# Patient Record
Sex: Female | Born: 1985
Health system: Southern US, Community
[De-identification: ages and names within clinical notes are randomized; demographics above are authoritative.]

## PROBLEM LIST (undated history)

## (undated) ENCOUNTER — Inpatient Hospital Stay (HOSPITAL_COMMUNITY): Payer: Self-pay

## (undated) DIAGNOSIS — R3915 Urgency of urination: Secondary | ICD-10-CM

## (undated) DIAGNOSIS — N200 Calculus of kidney: Secondary | ICD-10-CM

## (undated) DIAGNOSIS — R87629 Unspecified abnormal cytological findings in specimens from vagina: Secondary | ICD-10-CM

## (undated) DIAGNOSIS — R300 Dysuria: Secondary | ICD-10-CM

## (undated) DIAGNOSIS — K219 Gastro-esophageal reflux disease without esophagitis: Secondary | ICD-10-CM

## (undated) DIAGNOSIS — O1490 Unspecified pre-eclampsia, unspecified trimester: Secondary | ICD-10-CM

## (undated) DIAGNOSIS — F329 Major depressive disorder, single episode, unspecified: Secondary | ICD-10-CM

## (undated) DIAGNOSIS — N1330 Unspecified hydronephrosis: Secondary | ICD-10-CM

## (undated) DIAGNOSIS — Z87442 Personal history of urinary calculi: Secondary | ICD-10-CM

## (undated) DIAGNOSIS — D649 Anemia, unspecified: Secondary | ICD-10-CM

## (undated) DIAGNOSIS — Z8489 Family history of other specified conditions: Secondary | ICD-10-CM

## (undated) DIAGNOSIS — F419 Anxiety disorder, unspecified: Secondary | ICD-10-CM

## (undated) DIAGNOSIS — J45909 Unspecified asthma, uncomplicated: Secondary | ICD-10-CM

## (undated) DIAGNOSIS — D241 Benign neoplasm of right breast: Secondary | ICD-10-CM

## (undated) DIAGNOSIS — K589 Irritable bowel syndrome without diarrhea: Secondary | ICD-10-CM

## (undated) DIAGNOSIS — R350 Frequency of micturition: Secondary | ICD-10-CM

## (undated) DIAGNOSIS — N390 Urinary tract infection, site not specified: Secondary | ICD-10-CM

## (undated) DIAGNOSIS — R52 Pain, unspecified: Secondary | ICD-10-CM

## (undated) HISTORY — DX: Major depressive disorder, single episode, unspecified: F32.9

## (undated) HISTORY — DX: Irritable bowel syndrome, unspecified: K58.9

## (undated) HISTORY — PX: WISDOM TOOTH EXTRACTION: SHX21

## (undated) HISTORY — DX: Unspecified abnormal cytological findings in specimens from vagina: R87.629

## (undated) HISTORY — PX: TONSILLECTOMY: SUR1361

## (undated) HISTORY — PX: KNEE SURGERY: SHX244

## (undated) HISTORY — DX: Benign neoplasm of right breast: D24.1

## (undated) HISTORY — DX: Anxiety disorder, unspecified: F41.9

## (undated) HISTORY — DX: Gastro-esophageal reflux disease without esophagitis: K21.9

---
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
## 2006-09-03 ENCOUNTER — Ambulatory Visit: Payer: Self-pay | Admitting: Gastroenterology

## 2007-03-27 ENCOUNTER — Other Ambulatory Visit: Admission: RE | Admit: 2007-03-27 | Discharge: 2007-03-27 | Payer: Self-pay | Admitting: Obstetrics and Gynecology

## 2008-04-12 ENCOUNTER — Other Ambulatory Visit: Admission: RE | Admit: 2008-04-12 | Discharge: 2008-04-12 | Payer: Self-pay | Admitting: Obstetrics and Gynecology

## 2008-04-19 ENCOUNTER — Ambulatory Visit (HOSPITAL_COMMUNITY): Admission: RE | Admit: 2008-04-19 | Discharge: 2008-04-19 | Payer: Self-pay | Admitting: Obstetrics and Gynecology

## 2008-10-21 ENCOUNTER — Ambulatory Visit (HOSPITAL_COMMUNITY): Admission: RE | Admit: 2008-10-21 | Discharge: 2008-10-21 | Payer: Self-pay | Admitting: Obstetrics & Gynecology

## 2009-04-20 ENCOUNTER — Other Ambulatory Visit: Admission: RE | Admit: 2009-04-20 | Discharge: 2009-04-20 | Payer: Self-pay | Admitting: Obstetrics and Gynecology

## 2009-04-28 ENCOUNTER — Ambulatory Visit (HOSPITAL_COMMUNITY): Admission: RE | Admit: 2009-04-28 | Discharge: 2009-04-28 | Payer: Self-pay | Admitting: Obstetrics and Gynecology

## 2009-11-02 ENCOUNTER — Ambulatory Visit (HOSPITAL_COMMUNITY): Admission: RE | Admit: 2009-11-02 | Discharge: 2009-11-02 | Payer: Self-pay | Admitting: Internal Medicine

## 2010-04-20 ENCOUNTER — Other Ambulatory Visit: Admission: RE | Admit: 2010-04-20 | Discharge: 2010-04-20 | Payer: Self-pay | Admitting: Obstetrics & Gynecology

## 2010-05-10 ENCOUNTER — Ambulatory Visit (HOSPITAL_COMMUNITY): Admission: RE | Admit: 2010-05-10 | Discharge: 2010-05-10 | Payer: Self-pay | Admitting: Obstetrics and Gynecology

## 2010-11-17 IMAGING — US US BREAST*R*
1 series · 6 of 6 positions shown · non-contrast
Comparison: 04/19/2008.

CLINICAL DATA: 6-month reevaluation of probably benign right
breast nodule.

RIGHT BREAST ULTRASOUND

[Series 1: us breast right · 0.07mm/px · 6 of 6 slices shown]
[im 1/6]
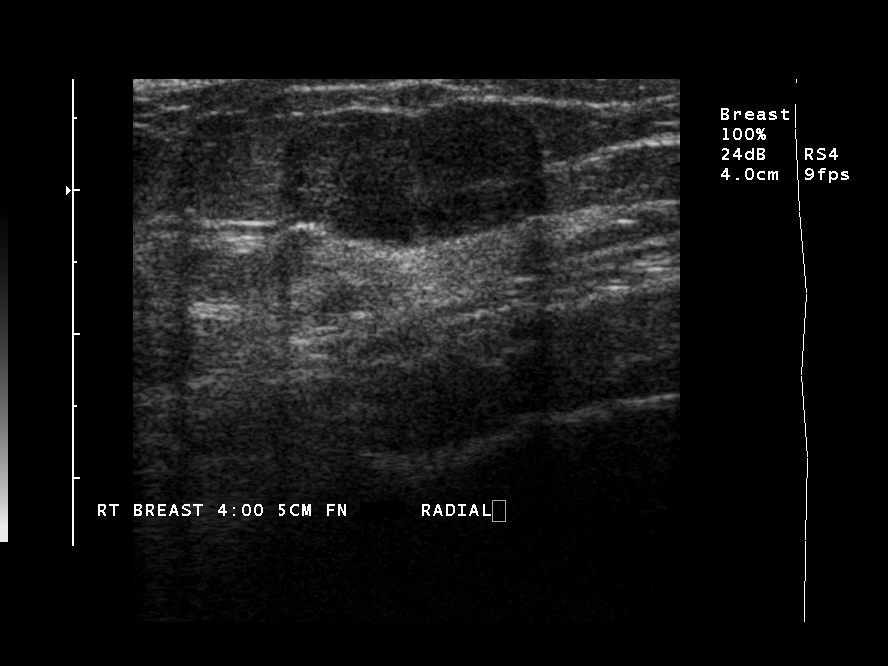
[im 2/6]
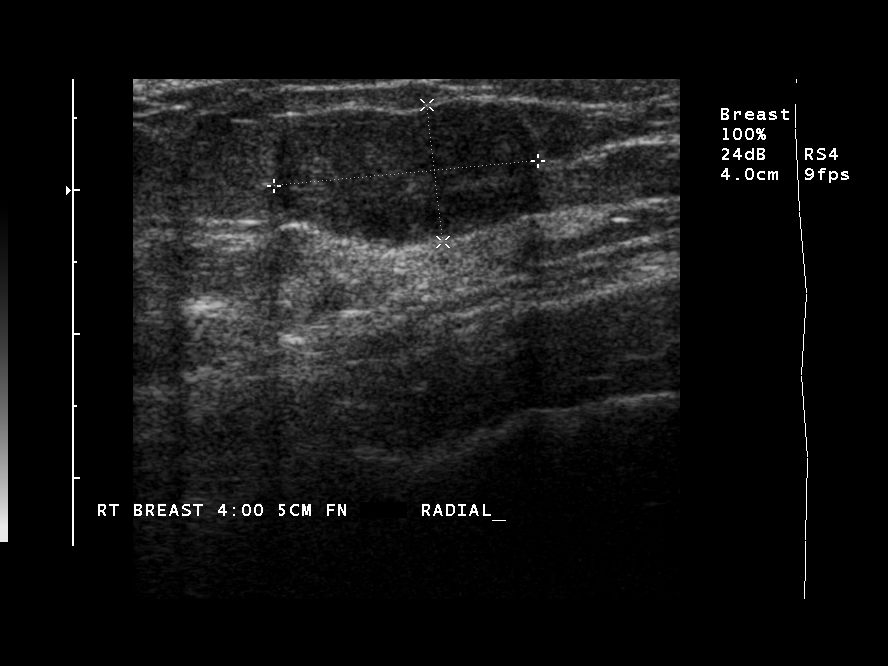
[im 3/6]
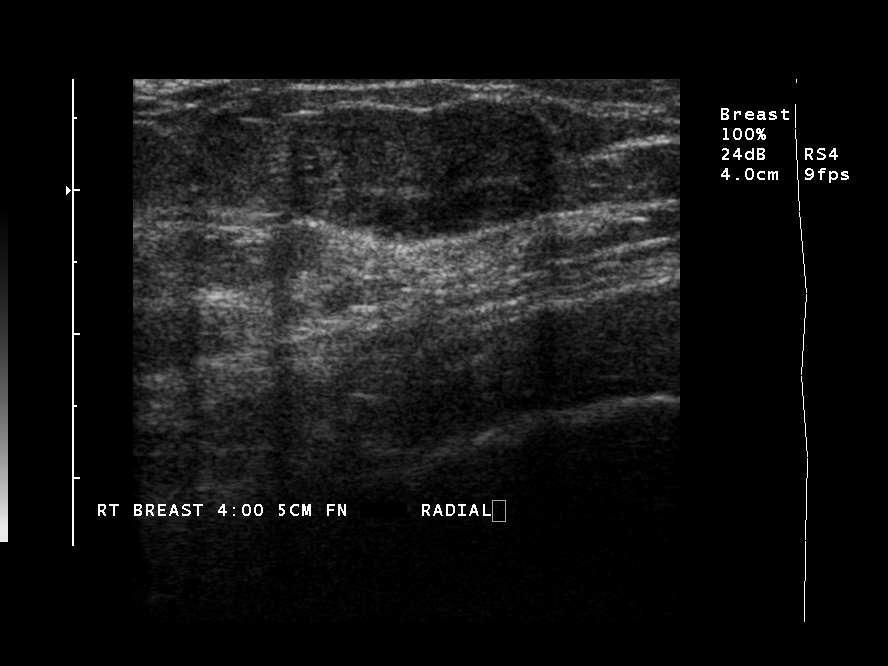
[im 4/6]
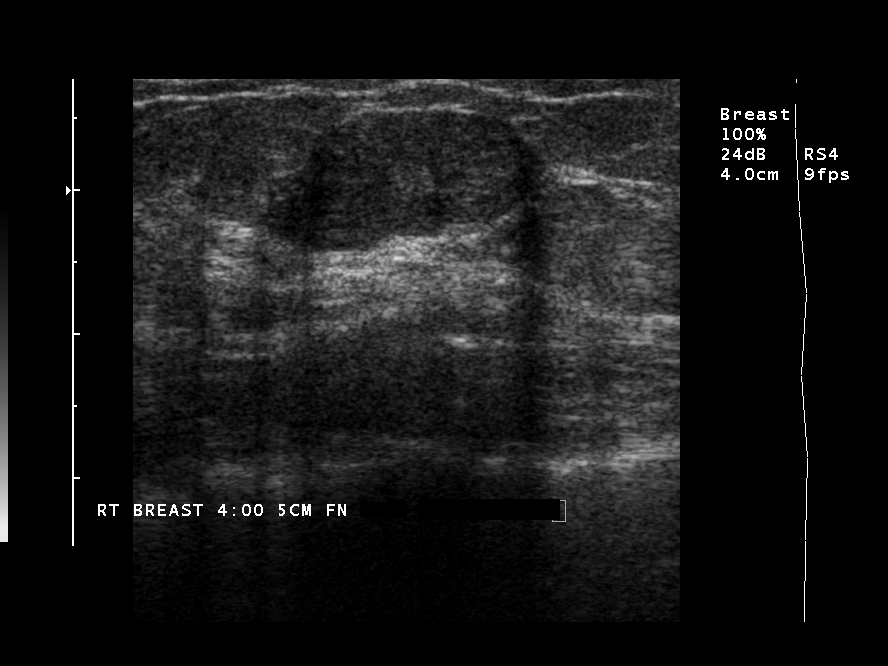
[im 5/6]
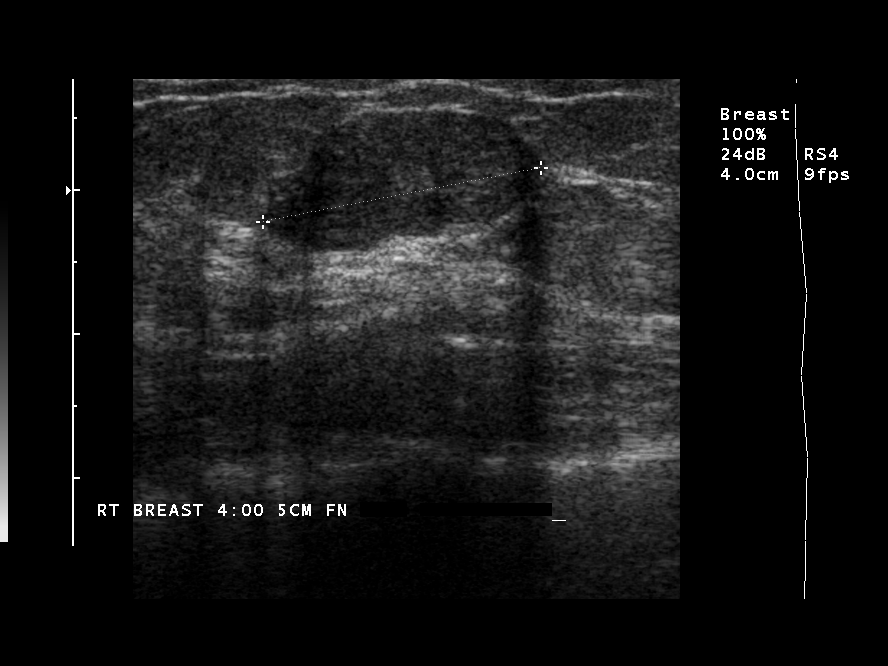
[im 6/6]
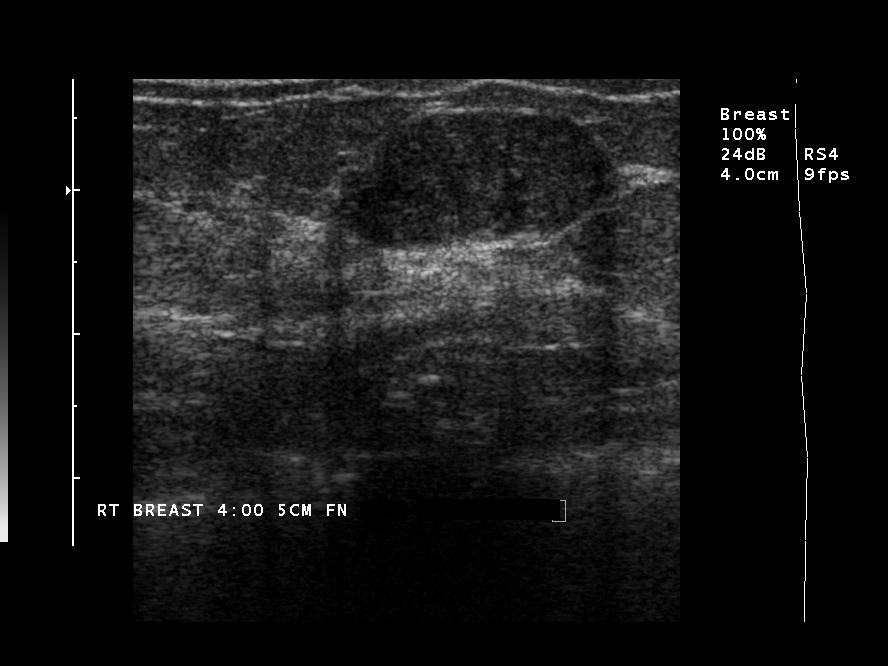

[6 of 6 positions shown; findings below may reference images not displayed]

On physical exam, there is a discrete mobile palpable mass located
within the right breast at the three to four o'clock position 5 cm
from the nipple.
FINDINGS: Ultrasound is performed, showing a circumscribed gently
lobulated solid mass within the right breast at the 3 to 4 o'clock
position 5 cm from the nipple.  This measures 2.0 x 1.8 x 1.0 cm in
size and has not significantly changed when compared with the prior
study (previously 1.9 x 0.9 x 1.7 cm in size).  There are several
thin septations within this mass.  There is no distortion or
worrisome shadowing.
IMPRESSION: 1. Circumscribed oval solid nodule located within the right breast
at the three to four o'clock position 5 cm from the nipple which is
stable and most likely represents a probably benign fibroadenoma.
Recommend follow-up right breast ultrasound in 6 months.

BI-RADS CATEGORY 3:  Probably benign finding(s) - short interval
follow-up suggested.
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
## 2011-04-24 ENCOUNTER — Other Ambulatory Visit: Payer: Self-pay | Admitting: Adult Health

## 2011-04-24 ENCOUNTER — Other Ambulatory Visit (HOSPITAL_COMMUNITY)
Admission: RE | Admit: 2011-04-24 | Discharge: 2011-04-24 | Disposition: A | Discharge status: Home / Self Care | Payer: 59 | Source: Ambulatory Visit | Attending: Obstetrics and Gynecology | Admitting: Obstetrics and Gynecology

## 2011-04-24 DIAGNOSIS — Z01419 Encounter for gynecological examination (general) (routine) without abnormal findings: Secondary | ICD-10-CM | POA: Insufficient documentation

## 2011-05-25 IMAGING — US US BREAST*R*
1 series · 4 of 4 positions shown · non-contrast
Comparison: Previous examinations dated 10/21/2008 and 04/19/2008.

CLINICAL DATA: Follow-up right breast probable fibroadenoma.

RIGHT BREAST ULTRASOUND

[Series 1: us breast*right* · 0.07mm/px · 4 of 4 slices shown]
[im 1/4]
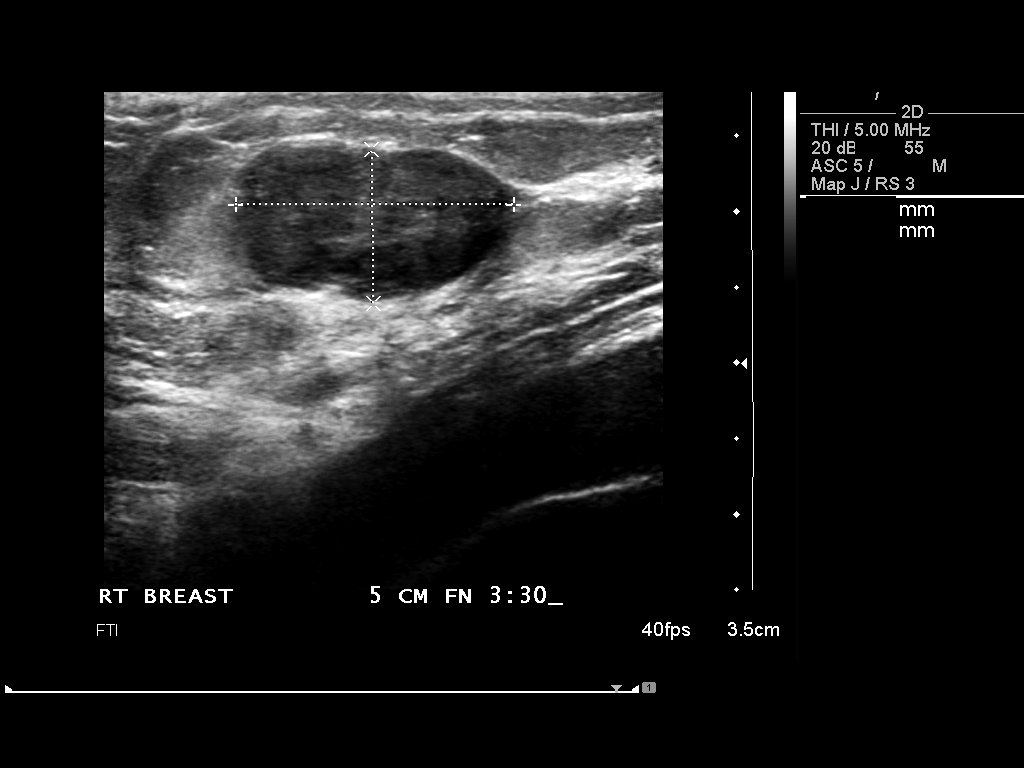
[im 2/4]
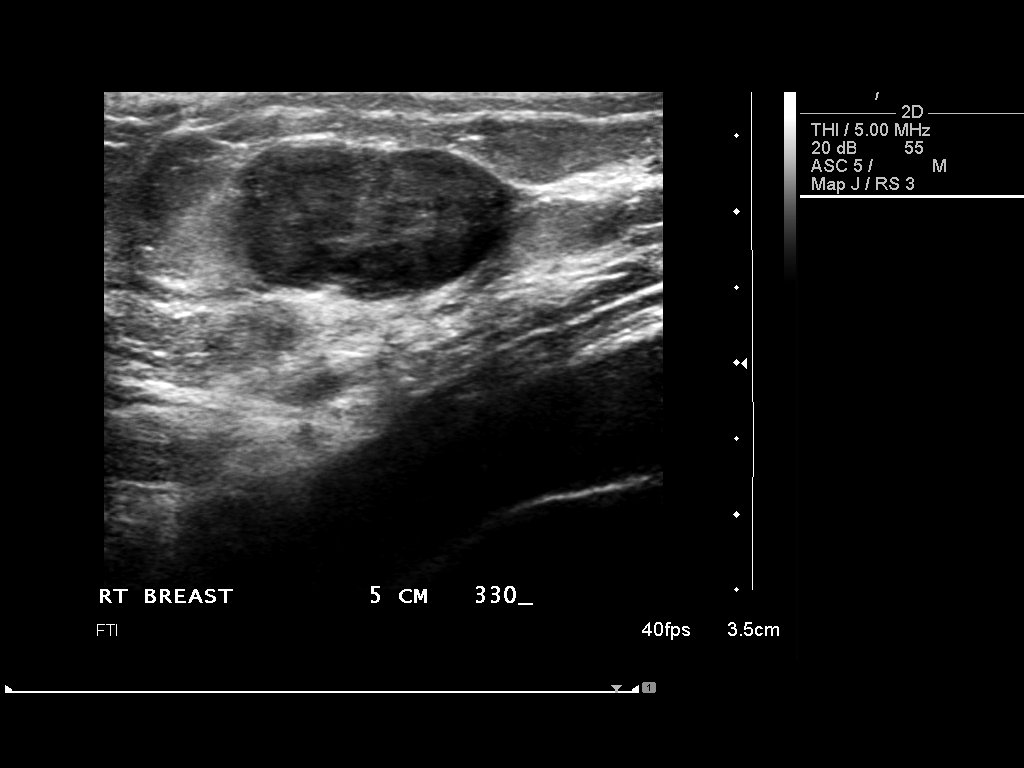
[im 3/4]
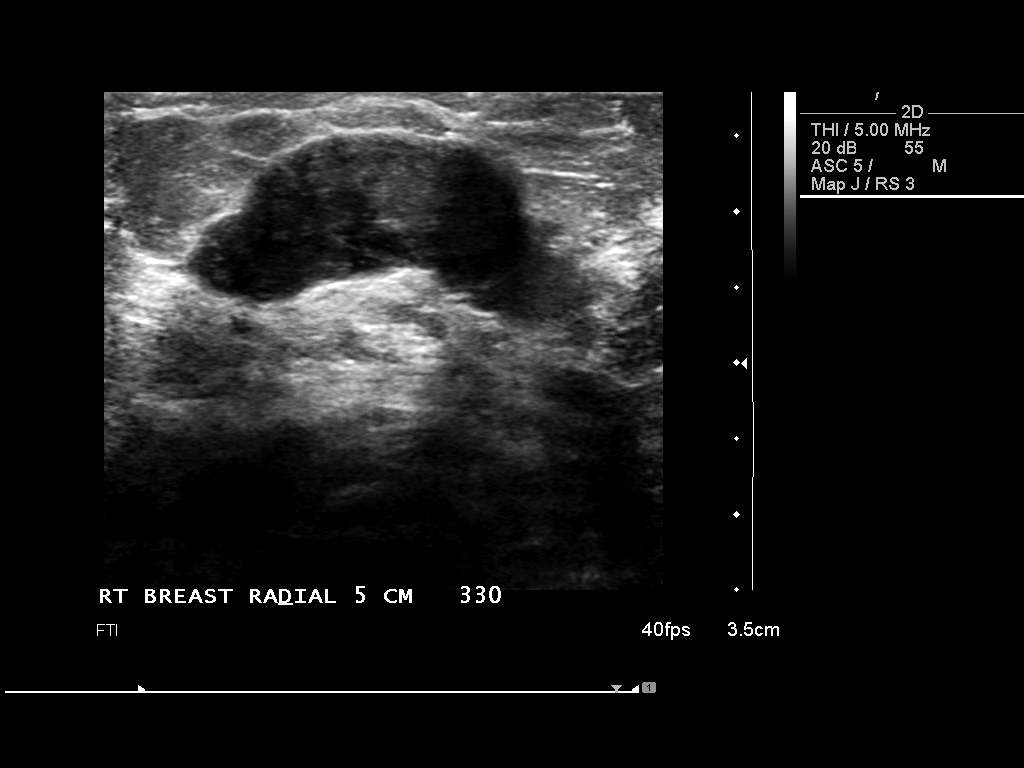
[im 4/4]
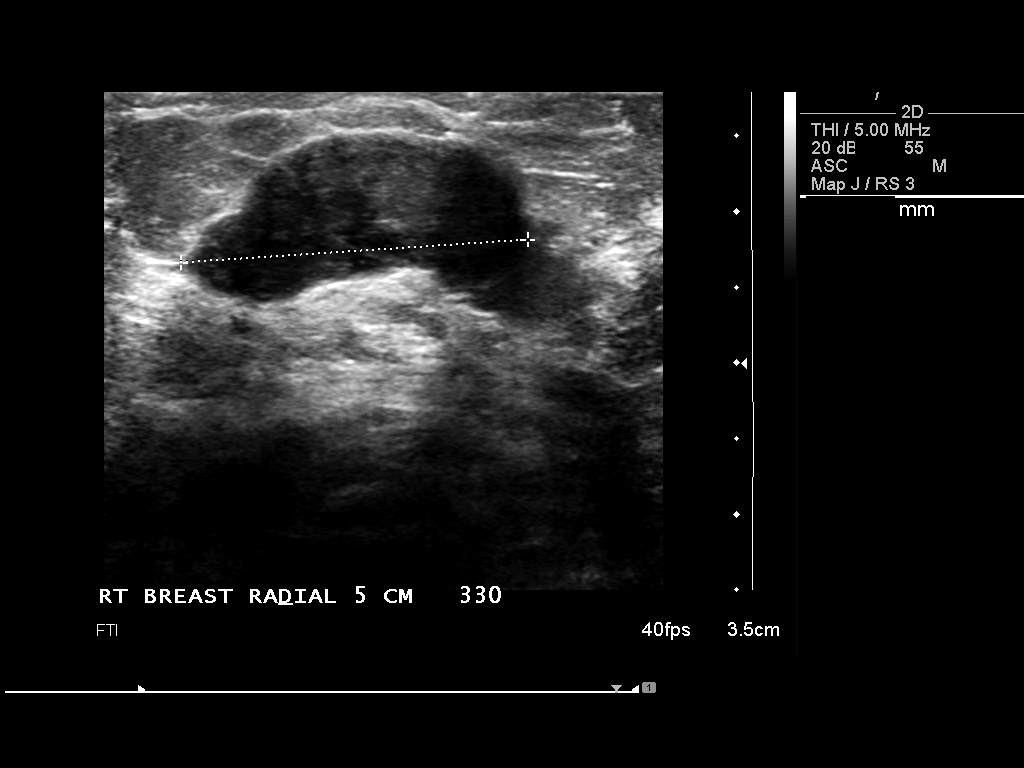

[4 of 4 positions shown; findings below may reference images not displayed]

On physical exam, a mobile palpable mass is confirmed in the 3:30
o'clock position of the right breast, 3 cm from the nipple.
FINDINGS: Ultrasound is performed, showing a 2.3 x 1.8 x 1.0 cm
oval, macrolobulated, horizontally oriented hypoechoic echoic mass
in the 3:30 o'clock position of the right breast, 5 cm from the
nipple.  This is slightly more lobulated than on the previous
examinations.  This measured 2.0 x 1.8 x 1.0 cm in maximum
dimensions on 10/21/2008 and 1.9 x 1.7 x 0.9 cm in maximum
dimensions on 04/19/2008.
IMPRESSION: Minimal increase in size of the previously demonstrated right
breast probable fibroadenoma.  A follow-up right breast ultrasound
is recommended in 6 months.  This has been discussed with the
patient.

BI-RADS CATEGORY 3:  Probably benign finding(s) - short interval
follow-up suggested.

## 2013-04-30 ENCOUNTER — Other Ambulatory Visit: Payer: Self-pay | Admitting: Adult Health

## 2013-05-04 ENCOUNTER — Other Ambulatory Visit: Payer: Self-pay | Admitting: Obstetrics & Gynecology

## 2013-05-11 ENCOUNTER — Other Ambulatory Visit (HOSPITAL_COMMUNITY)
Admission: RE | Admit: 2013-05-11 | Discharge: 2013-05-11 | Disposition: A | Discharge status: Home / Self Care | Payer: 59 | Source: Ambulatory Visit | Attending: Obstetrics & Gynecology | Admitting: Obstetrics & Gynecology

## 2013-05-11 ENCOUNTER — Encounter: Payer: Self-pay | Admitting: Obstetrics & Gynecology

## 2013-05-11 ENCOUNTER — Ambulatory Visit (INDEPENDENT_AMBULATORY_CARE_PROVIDER_SITE_OTHER): Payer: 59 | Admitting: Obstetrics & Gynecology

## 2013-05-11 VITALS — BP 120/78 | Ht 66.0 in | Wt 187.0 lb

## 2013-05-11 DIAGNOSIS — Z01419 Encounter for gynecological examination (general) (routine) without abnormal findings: Secondary | ICD-10-CM

## 2013-05-11 MED ORDER — ETONOGESTREL-ETHINYL ESTRADIOL 0.12-0.015 MG/24HR VA RING: VAGINAL_RING | VAGINAL | Status: DC

## 2013-05-11 MED ORDER — TERCONAZOLE 0.4 % VA CREA: 1.0000 | TOPICAL_CREAM | Freq: Every day | VAGINAL | Status: DC

## 2013-05-11 NOTE — Addendum Note (Signed)
Addended by: Colen Darling on: 05/11/2013 03:51 PM   Modules accepted: Orders

## 2013-05-11 NOTE — Progress Notes (Signed)
Patient ID: Melody Stewart, female   DOB: [birth year], 27 y.o.   MRN: [id number] Subjective:     Melody Stewart is a 27 y.o. female here for a routine exam.  Patient's last menstrual period was 05/03/2013. No obstetric history on file. Current complaints: none.  Gynecologic History Patient's last menstrual period was 05/03/2013. Contraception: NuvaRing vaginal inserts Last Pap: 2013. Results were: normal Last mammogram: na. Results were: na  Past Medical History  Diagnosis Date  . Asthma   . Colitis     Past Surgical History  Procedure Laterality Date  . Knee surgery      OB History   Grav Para Term Preterm Abortions TAB SAB Ect Mult Living                  History   Social History  . Marital Status: Single    Spouse Name: N/A    Number of Children: N/A  . Years of Education: N/A   Social History Main Topics  . Smoking status: Never Smoker   . Smokeless tobacco: None  . Alcohol Use: 0.5 oz/week    1 drink(s) per week  . Drug Use: None  . Sexual Activity: Yes   Other Topics Concern  . None   Social History Narrative  . None    Family History  Problem Relation Age of Onset  . Hypertension Mother   . Hypertension Father   . Heart disease Paternal Grandfather   . Stroke Other      Review of Systems  Review of Systems  Constitutional: Negative for fever, chills, weight loss, malaise/fatigue and diaphoresis.  HENT: Negative for hearing loss, ear pain, nosebleeds, congestion, sore throat, neck pain, tinnitus and ear discharge.   Eyes: Negative for blurred vision, double vision, photophobia, pain, discharge and redness.  Respiratory: Negative for cough, hemoptysis, sputum production, shortness of breath, wheezing and stridor.   Cardiovascular: Negative for chest pain, palpitations, orthopnea, claudication, leg swelling and PND.  Gastrointestinal: negative for abdominal pain. Negative for heartburn, nausea, vomiting, diarrhea, constipation, blood in stool and  melena.  Genitourinary: Negative for dysuria, urgency, frequency, hematuria and flank pain.  Musculoskeletal: Negative for myalgias, back pain, joint pain and falls.  Skin: Negative for itching and rash.  Neurological: Negative for dizziness, tingling, tremors, sensory change, speech change, focal weakness, seizures, loss of consciousness, weakness and headaches.  Endo/Heme/Allergies: Negative for environmental allergies and polydipsia. Does not bruise/bleed easily.  Psychiatric/Behavioral: Negative for depression, suicidal ideas, hallucinations, memory loss and substance abuse. The patient is not nervous/anxious and does not have insomnia.        Objective:    Physical Exam  Vitals reviewed. Constitutional: She is oriented to person, place, and time. She appears well-developed and well-nourished.  HENT:  Head: Normocephalic and atraumatic.        Right Ear: External ear normal.  Left Ear: External ear normal.  Nose: Nose normal.  Mouth/Throat: Oropharynx is clear and moist.  Eyes: Conjunctivae and EOM are normal. Pupils are equal, round, and reactive to light. Right eye exhibits no discharge. Left eye exhibits no discharge. No scleral icterus.  Neck: Normal range of motion. Neck supple. No tracheal deviation present. No thyromegaly present.  Cardiovascular: Normal rate, regular rhythm, normal heart sounds and intact distal pulses.  Exam reveals no gallop and no friction rub.   No murmur heard. Respiratory: Effort normal and breath sounds normal. No respiratory distress. She has no wheezes. She has no rales. She exhibits no  tenderness.  GI: Soft. Bowel sounds are normal. She exhibits no distension and no mass. There is no tenderness. There is no rebound and no guarding.  Genitourinary:  Breasts no masses skin changes or nipple changes bilaterally      Vulva is normal without lesions Vagina is pink moist without discharge Cervix normal in appearance and pap is done Uterus is normal  size shape and contour Adnexa is negative with normal sized ovaries    Musculoskeletal: Normal range of motion. She exhibits no edema and no tenderness.  Neurological: She is alert and oriented to person, place, and time. She has normal reflexes. She displays normal reflexes. No cranial nerve deficit. She exhibits normal muscle tone. Coordination normal.  Skin: Skin is warm and dry. No rash noted. No erythema. No pallor.  Psychiatric: She has a normal mood and affect. Her behavior is normal. Judgment and thought content normal.       Assessment:    Healthy female exam.    Plan:    Contraception: NuvaRing vaginal inserts. Follow up in: 1 year.

## 2014-05-11 ENCOUNTER — Other Ambulatory Visit (HOSPITAL_COMMUNITY)
Admission: RE | Admit: 2014-05-11 | Discharge: 2014-05-11 | Disposition: A | Discharge status: Home / Self Care | Payer: 59 | Source: Ambulatory Visit | Attending: Obstetrics & Gynecology | Admitting: Obstetrics & Gynecology

## 2014-05-11 ENCOUNTER — Ambulatory Visit (INDEPENDENT_AMBULATORY_CARE_PROVIDER_SITE_OTHER): Payer: 59 | Admitting: Obstetrics & Gynecology

## 2014-05-11 ENCOUNTER — Encounter: Payer: Self-pay | Admitting: Obstetrics & Gynecology

## 2014-05-11 VITALS — BP 110/70 | Ht 66.0 in | Wt 203.4 lb

## 2014-05-11 DIAGNOSIS — Z01419 Encounter for gynecological examination (general) (routine) without abnormal findings: Secondary | ICD-10-CM

## 2014-05-11 MED ORDER — ETONOGESTREL-ETHINYL ESTRADIOL 0.12-0.015 MG/24HR VA RING
VAGINAL_RING | VAGINAL | Status: DC
Start: 2014-05-11 — End: 2015-04-21

## 2014-05-11 MED ORDER — OMEPRAZOLE 20 MG PO CPDR: 20.0000 mg | DELAYED_RELEASE_CAPSULE | Freq: Every day | ORAL | Status: DC

## 2014-05-11 NOTE — Progress Notes (Signed)
Patient ID: Melody Stewart, female   DOB: 12-Jun-1986, 28 y.o.   MRN: [id number] Subjective:     Melody Stewart is a 28 y.o. female here for a routine exam.  Patient's last menstrual period was 04/15/2014. No obstetric history on file. Birth Control Method:  Nuva ring Menstrual Calendar(currently): regular  Current complaints: none.   Current acute medical issues:  none   Recent Gynecologic History Patient's last menstrual period was 04/15/2014. Last Pap: 2014,  normal Last mammogram: ,    Past Medical History  Diagnosis Date  . Asthma   . Colitis     Past Surgical History  Procedure Laterality Date  . Knee surgery      OB History   Grav Para Term Preterm Abortions TAB SAB Ect Mult Living                  History   Social History  . Marital Status: Single    Spouse Name: N/A    Number of Children: N/A  . Years of Education: N/A   Social History Main Topics  . Smoking status: Former Research scientist (life sciences)  . Smokeless tobacco: None  . Alcohol Use: 0.5 oz/week    1 drink(s) per week  . Drug Use: None  . Sexual Activity: Yes   Other Topics Concern  . None   Social History Narrative  . None    Family History  Problem Relation Age of Onset  . Hypertension Mother   . Hypertension Father   . Heart disease Paternal Grandfather   . Stroke Other     Current outpatient prescriptions:etonogestrel-ethinyl estradiol (NUVARING) 0.12-0.015 MG/24HR vaginal ring, USE AS DIRECTED-INSERT INTO VAGINA FOR 25 DAYS AND TAKE OUT FOR 3 DAYS, Disp: 1 each, Rfl: 11;  ciprofloxacin (CIPRO) 500 MG tablet, Take 500 mg by mouth 2 (two) times daily., Disp: , Rfl: ;  terconazole (TERAZOL 7) 0.4 % vaginal cream, Place 1 applicator vaginally at bedtime., Disp: 45 g, Rfl: 0  Review of Systems  Review of Systems  Constitutional: Negative for fever, chills, weight loss, malaise/fatigue and diaphoresis.  HENT: Negative for hearing loss, ear pain, nosebleeds, congestion, sore throat, neck pain, tinnitus  and ear discharge.   Eyes: Negative for blurred vision, double vision, photophobia, pain, discharge and redness.  Respiratory: Negative for cough, hemoptysis, sputum production, shortness of breath, wheezing and stridor.   Cardiovascular: Negative for chest pain, palpitations, orthopnea, claudication, leg swelling and PND.  Gastrointestinal: negative for abdominal pain. Negative for heartburn, nausea, vomiting, diarrhea, constipation, blood in stool and melena.  Genitourinary: Negative for dysuria, urgency, frequency, hematuria and flank pain.  Musculoskeletal: Negative for myalgias, back pain, joint pain and falls.  Skin: Negative for itching and rash.  Neurological: Negative for dizziness, tingling, tremors, sensory change, speech change, focal weakness, seizures, loss of consciousness, weakness and headaches.  Endo/Heme/Allergies: Negative for environmental allergies and polydipsia. Does not bruise/bleed easily.  Psychiatric/Behavioral: Negative for depression, suicidal ideas, hallucinations, memory loss and substance abuse. The patient is not nervous/anxious and does not have insomnia.        Objective:  Blood pressure 110/70, height 5\' 6"  (1.676 m), weight 203 lb 6.4 oz (92.262 kg), last menstrual period 04/15/2014.   Physical Exam  Vitals reviewed. Constitutional: She is oriented to person, place, and time. She appears well-developed and well-nourished.  HENT:  Head: Normocephalic and atraumatic.        Right Ear: External ear normal.  Left Ear: External ear normal.  Nose: Nose normal.  Mouth/Throat: Oropharynx is clear and moist.  Eyes: Conjunctivae and EOM are normal. Pupils are equal, round, and reactive to light. Right eye exhibits no discharge. Left eye exhibits no discharge. No scleral icterus.  Neck: Normal range of motion. Neck supple. No tracheal deviation present. No thyromegaly present.  Cardiovascular: Normal rate, regular rhythm, normal heart sounds and intact distal  pulses.  Exam reveals no gallop and no friction rub.   No murmur heard. Respiratory: Effort normal and breath sounds normal. No respiratory distress. She has no wheezes. She has no rales. She exhibits no tenderness.  GI: Soft. Bowel sounds are normal. She exhibits no distension and no mass. There is no tenderness. There is no rebound and no guarding.  Genitourinary:  Breasts no masses skin changes or nipple changes bilaterally      Vulva is normal without lesions Vagina is pink moist without discharge Cervix normal in appearance and pap is done Uterus is normal size shape and contour Adnexa is negative with normal sized ovaries   Musculoskeletal: Normal range of motion. She exhibits no edema and no tenderness.  Neurological: She is alert and oriented to person, place, and time. She has normal reflexes. She displays normal reflexes. No cranial nerve deficit. She exhibits normal muscle tone. Coordination normal.  Skin: Skin is warm and dry. No rash noted. No erythema. No pallor.  Psychiatric: She has a normal mood and affect. Her behavior is normal. Judgment and thought content normal.       Assessment:    Healthy female exam.    Plan:    Contraception: NuvaRing vaginal inserts. Follow up in: 1 year.

## 2014-05-12 LAB — CYTOLOGY - PAP

## 2015-04-21 ENCOUNTER — Other Ambulatory Visit: Payer: Self-pay | Admitting: *Deleted

## 2015-04-21 MED ORDER — ETONOGESTREL-ETHINYL ESTRADIOL 0.12-0.015 MG/24HR VA RING: VAGINAL_RING | VAGINAL | Status: DC

## 2015-05-03 ENCOUNTER — Other Ambulatory Visit: Payer: Self-pay | Admitting: *Deleted

## 2015-05-18 ENCOUNTER — Encounter: Payer: Self-pay | Admitting: Adult Health

## 2015-05-18 ENCOUNTER — Other Ambulatory Visit (HOSPITAL_COMMUNITY)
Admission: RE | Admit: 2015-05-18 | Discharge: 2015-05-18 | Disposition: A | Discharge status: Home / Self Care | Payer: Commercial Managed Care - HMO | Source: Ambulatory Visit | Attending: Adult Health | Admitting: Adult Health

## 2015-05-18 ENCOUNTER — Ambulatory Visit (INDEPENDENT_AMBULATORY_CARE_PROVIDER_SITE_OTHER): Payer: Commercial Managed Care - HMO | Admitting: Adult Health

## 2015-05-18 VITALS — BP 110/70 | HR 72 | Ht 66.0 in | Wt 174.0 lb

## 2015-05-18 DIAGNOSIS — Z01419 Encounter for gynecological examination (general) (routine) without abnormal findings: Secondary | ICD-10-CM | POA: Diagnosis present

## 2015-05-18 DIAGNOSIS — Z113 Encounter for screening for infections with a predominantly sexual mode of transmission: Secondary | ICD-10-CM | POA: Diagnosis present

## 2015-05-18 DIAGNOSIS — Z3049 Encounter for surveillance of other contraceptives: Secondary | ICD-10-CM

## 2015-05-18 DIAGNOSIS — Z1151 Encounter for screening for human papillomavirus (HPV): Secondary | ICD-10-CM | POA: Insufficient documentation

## 2015-05-18 DIAGNOSIS — Z309 Encounter for contraceptive management, unspecified: Secondary | ICD-10-CM | POA: Insufficient documentation

## 2015-05-18 MED ORDER — ETONOGESTREL-ETHINYL ESTRADIOL 0.12-0.015 MG/24HR VA RING
VAGINAL_RING | VAGINAL | Status: DC
Start: 2015-05-18 — End: 2016-05-18

## 2015-05-18 NOTE — Patient Instructions (Signed)
Physical in  1 year  Pap in 3 if normal Use condoms 

## 2015-05-18 NOTE — Progress Notes (Signed)
Patient ID: Melody Stewart, female   DOB: 07/20/1986, 29 y.o.   MRN: [id number] History of Present Illness: Melody Stewart is a 29 year old white female,G0P0,single,just out of 6 year relationship,in for well woman gyn exam and pap and she wants STD testing on pap.She has been using nuva ring and is happy, did miss a month due to insurance issues. Working at [employer] now and hours are crazy.  Current Medications, Allergies, Past Medical History, Past Surgical History, Family History and Social History were reviewed in [employer] record.     Review of Systems: Patient denies any headaches, hearing loss, fatigue, blurred vision, shortness of breath, chest pain, abdominal pain, problems with bowel movements, urination, or intercourse(not currently having sex). No joint pain or mood swings.    Physical Exam:BP 110/70 mmHg  Pulse 72  Ht 5\' 6"  (1.676 m)  Wt 174 lb (78.926 kg)  BMI 28.10 kg/m2  LMP 05/11/2015 General:  Well developed, well nourished, no acute distress Skin:  Warm and dry Neck:  Midline trachea, normal thyroid, good ROM, no lymphadenopathy Lungs; Clear to auscultation bilaterally Breast:  No dominant palpable mass, retraction, or nipple discharge,has known fibroadenoma in right breast at 3 0'clock,I could not feel but she showed be me where she feels it Cardiovascular: Regular rate and rhythm Abdomen:  Soft, non tender, no hepatosplenomegaly Pelvic:  External genitalia is normal in appearance, no lesions.  The vagina is normal in appearance. Urethra has no lesions or masses. The cervix is nulliparous,pap with HPV and GC/CHL performed.  Uterus is felt to be normal size, shape, and contour.  No adnexal masses or tenderness noted.Bladder is non tender, no masses felt. Extremities/musculoskeletal:  No swelling or varicosities noted, no clubbing or cyanosis Psych:  No mood changes, alert and cooperative,seems happy   Impression: Well woman gyn exam and  pap Contraceptive management    Plan: Refilled nuva ring x 1 year Physical in 1 year Pap in 3 years if normal with negative HPV Mammogram at 39 Use condoms

## 2015-05-19 LAB — CYTOLOGY - PAP

## 2015-07-06 ENCOUNTER — Ambulatory Visit: Payer: Self-pay | Admitting: "Endocrinology

## 2015-09-14 ENCOUNTER — Ambulatory Visit (INDEPENDENT_AMBULATORY_CARE_PROVIDER_SITE_OTHER): Payer: Federal, State, Local not specified - PPO | Admitting: Adult Health

## 2015-09-14 ENCOUNTER — Encounter: Payer: Self-pay | Admitting: Adult Health

## 2015-09-14 VITALS — BP 120/82 | HR 80 | Ht 66.0 in | Wt 172.0 lb

## 2015-09-14 DIAGNOSIS — N898 Other specified noninflammatory disorders of vagina: Secondary | ICD-10-CM

## 2015-09-14 DIAGNOSIS — F419 Anxiety disorder, unspecified: Secondary | ICD-10-CM | POA: Insufficient documentation

## 2015-09-14 DIAGNOSIS — F418 Other specified anxiety disorders: Secondary | ICD-10-CM

## 2015-09-14 DIAGNOSIS — F32A Depression, unspecified: Secondary | ICD-10-CM

## 2015-09-14 DIAGNOSIS — O3680X0 Pregnancy with inconclusive fetal viability, not applicable or unspecified: Secondary | ICD-10-CM | POA: Insufficient documentation

## 2015-09-14 DIAGNOSIS — Z113 Encounter for screening for infections with a predominantly sexual mode of transmission: Secondary | ICD-10-CM

## 2015-09-14 DIAGNOSIS — L298 Other pruritus: Secondary | ICD-10-CM

## 2015-09-14 DIAGNOSIS — F329 Major depressive disorder, single episode, unspecified: Secondary | ICD-10-CM

## 2015-09-14 HISTORY — DX: Anxiety disorder, unspecified: F41.9

## 2015-09-14 HISTORY — DX: Depression, unspecified: F32.A

## 2015-09-14 LAB — POCT WET PREP (WET MOUNT): WBC, Wet Prep HPF POC: NEGATIVE

## 2015-09-14 MED ORDER — FLUCONAZOLE 150 MG PO TABS: ORAL_TABLET | ORAL | Status: DC

## 2015-09-14 MED ORDER — BUPROPION HCL ER (SR) 150 MG PO TB12: 150.0000 mg | ORAL_TABLET | Freq: Every day | ORAL | Status: DC

## 2015-09-14 NOTE — Progress Notes (Signed)
Subjective:     Patient ID: Melody Stewart, female   DOB: 1986-02-10, 30 y.o.   MRN: [id number]  HPI Melody Stewart is a 30 year old white female in complaining of vaginal itch and wants STD testing, is dating again and also feels teary and has had depression and anxiety in the past, and thinks she does again.She denies any suicidal or homicidal ideations.  Review of Systems Patient denies any headaches, hearing loss, fatigue, blurred vision, shortness of breath, chest pain, abdominal pain, problems with bowel movements, urination, or intercourse. No joint pain, see HPI for positives. Reviewed past medical,surgical, social and family history. Reviewed medications and allergies.     Objective:   Physical Exam BP 120/82 mmHg  Pulse 80  Ht 5\' 6"  (1.676 m)  Wt 172 lb (78.019 kg)  BMI 27.77 kg/m2  LMP 08/29/2015  Skin warm and dry.Pelvic: external genitalia is normal in appearance no lesions, vagina: white discharge without odor,urethra has no lesions or masses noted, cervix:smooth and bulbous, uterus: normal size, shape and contour, non tender, no masses felt, adnexa: no masses or tenderness noted. Bladder is non tender and no masses felt. Wet prep: negative GC/CHL obtained. Discussed meds, will try wellbutrin, has tried zoloft and lexapro when younger and felt off.Will start out with one daily may increase to 2 and add counseling if needed.   Has had warts in past,none now and last pap had negative HPV. Face time 15 minutes with 50 % counseling. Assessment:     Vaginal itch STD screening Anxiety and depression    Plan:    Rx diflucan 150 mg #2 take 1 now and 1 in 3 days with 1 refill Check HIV,RPR and HSV 2 GC/CHL sent Rx wellburtin 150 mg SR take 1 in am disp #30 with 1 refill Follow up in 4 weeks

## 2015-09-14 NOTE — Patient Instructions (Signed)
Start well butrin  Follow up in 4 weeks

## 2015-09-15 LAB — HIV ANTIBODY (ROUTINE TESTING W REFLEX): HIV Screen 4th Generation wRfx: NONREACTIVE

## 2015-09-15 LAB — RPR: RPR: NONREACTIVE

## 2015-09-15 LAB — HSV 2 ANTIBODY, IGG: HSV 2 Glycoprotein G Ab, IgG: <0.91 index (ref 0.00–0.90)

## 2015-09-16 ENCOUNTER — Telehealth: Payer: Self-pay | Admitting: *Deleted

## 2015-09-16 LAB — GC/CHLAMYDIA PROBE AMP
Chlamydia trachomatis, NAA: NEGATIVE
NEISSERIA GONORRHOEAE BY PCR: NEGATIVE

## 2015-09-16 NOTE — Telephone Encounter (Signed)
Spoke with pt letting her know all her labs were negative. Pt voiced understanding. [place]

## 2015-10-10 ENCOUNTER — Encounter: Payer: Self-pay | Admitting: Adult Health

## 2015-10-10 ENCOUNTER — Ambulatory Visit (INDEPENDENT_AMBULATORY_CARE_PROVIDER_SITE_OTHER): Payer: Federal, State, Local not specified - PPO | Admitting: Adult Health

## 2015-10-10 VITALS — BP 118/80 | HR 80 | Ht 66.0 in | Wt 169.5 lb

## 2015-10-10 DIAGNOSIS — F418 Other specified anxiety disorders: Secondary | ICD-10-CM | POA: Diagnosis not present

## 2015-10-10 DIAGNOSIS — F419 Anxiety disorder, unspecified: Principal | ICD-10-CM

## 2015-10-10 DIAGNOSIS — F329 Major depressive disorder, single episode, unspecified: Secondary | ICD-10-CM

## 2015-10-10 DIAGNOSIS — F32A Depression, unspecified: Secondary | ICD-10-CM

## 2015-10-10 MED ORDER — BUPROPION HCL ER (XL) 150 MG PO TB24: 150.0000 mg | ORAL_TABLET | Freq: Every day | ORAL | Status: DC

## 2015-10-10 NOTE — Progress Notes (Signed)
Subjective:     Patient ID: Melody Stewart, female   DOB: [birth year], 30 y.o.   MRN: [id number]  HPI Melody Stewart is a 30 year old white female back in follow up of starting wellbutrin, she says she is better but not great yet, she is smiling today.  Review of Systems Patient denies any headaches, hearing loss, fatigue, blurred vision, shortness of breath, chest pain, abdominal pain, problems with bowel movements, urination, or intercourse. No joint pain or mood swings. Reviewed past medical,surgical, social and family history. Reviewed medications and allergies.     Objective:   Physical Exam BP 118/80 mmHg  Pulse 80  Ht 5\' 6"  (1.676 m)  Wt 169 lb 8 oz (76.885 kg)  BMI 27.37 kg/m2  LMP 09/26/2015  Discussed since some better, but she wants to take just once a day, will stop wellbutrin SR and change to wellbutrin XL and see if that is better, She says if she takes in am feels groggy so takes in pm,face time 10 minutes    Assessment:     Anxiety and depression    Plan:     Will switch to Wellbutrin XL 150 mg #30, take 1 daily for 30 days then increase to 2 daily, 1 refill See me in 6 weeks

## 2015-10-10 NOTE — Patient Instructions (Signed)
Take wellbutrin XL 150 mg 1 daily for 30 days then increase to 2 daily and see me in 6 weeks

## 2015-11-21 ENCOUNTER — Encounter: Payer: Self-pay | Admitting: Adult Health

## 2015-11-21 ENCOUNTER — Ambulatory Visit (INDEPENDENT_AMBULATORY_CARE_PROVIDER_SITE_OTHER): Payer: Federal, State, Local not specified - PPO | Admitting: Adult Health

## 2015-11-21 VITALS — BP 104/80 | HR 74 | Ht 66.0 in | Wt 166.0 lb

## 2015-11-21 DIAGNOSIS — F32A Depression, unspecified: Secondary | ICD-10-CM

## 2015-11-21 DIAGNOSIS — F418 Other specified anxiety disorders: Secondary | ICD-10-CM | POA: Diagnosis not present

## 2015-11-21 DIAGNOSIS — F419 Anxiety disorder, unspecified: Principal | ICD-10-CM

## 2015-11-21 DIAGNOSIS — F329 Major depressive disorder, single episode, unspecified: Secondary | ICD-10-CM

## 2015-11-21 MED ORDER — BUPROPION HCL ER (XL) 300 MG PO TB24: 300.0000 mg | ORAL_TABLET | Freq: Every day | ORAL | Status: DC

## 2015-11-21 NOTE — Patient Instructions (Signed)
folloow up in 6 months for physical and see me  Continue wellbutrin

## 2015-11-21 NOTE — Progress Notes (Signed)
Subjective:     Patient ID: Melody Stewart, female   DOB: 1986/02/21, 30 y.o.   MRN: [id number]  HPI Quinasia is a 30 year old white female back in follow up of starting wellbutrin Xl 150 mg and then increasing to 2 daily and she feels much better.  Review of Systems  Patient denies any headaches, hearing loss, fatigue, blurred vision, shortness of breath, chest pain, abdominal pain, problems with bowel movements, urination, or intercourse. No joint pain or mood swings. Reviewed past medical,surgical, social and family history. Reviewed medications and allergies.     Objective:   Physical Exam BP 104/80 mmHg  Pulse 74  Ht 5\' 6"  (1.676 m)  Wt 166 lb (75.297 kg)  BMI 26.81 kg/m2  LMP 11/07/2015 (Approximate) Talk only    She says she is much better with taking 300 mg, and wants to continue, will occasionally have overwhelmed feeling but not often. 10 minutes face time counseling.  Assessment:     Anxiety and depression    Plan:     Rx wellbutrin XL 300 mg #30 take 1 daily with 6 refills Return in 6 months for physical and ROS

## 2016-02-24 ENCOUNTER — Ambulatory Visit (INDEPENDENT_AMBULATORY_CARE_PROVIDER_SITE_OTHER): Payer: Federal, State, Local not specified - PPO | Admitting: Adult Health

## 2016-02-24 ENCOUNTER — Encounter: Payer: Self-pay | Admitting: Adult Health

## 2016-02-24 VITALS — BP 120/60 | HR 90 | Ht 66.0 in | Wt 164.5 lb

## 2016-02-24 DIAGNOSIS — N898 Other specified noninflammatory disorders of vagina: Secondary | ICD-10-CM | POA: Diagnosis not present

## 2016-02-24 DIAGNOSIS — A499 Bacterial infection, unspecified: Secondary | ICD-10-CM

## 2016-02-24 DIAGNOSIS — N760 Acute vaginitis: Secondary | ICD-10-CM | POA: Diagnosis not present

## 2016-02-24 DIAGNOSIS — L298 Other pruritus: Secondary | ICD-10-CM

## 2016-02-24 DIAGNOSIS — B9689 Other specified bacterial agents as the cause of diseases classified elsewhere: Secondary | ICD-10-CM | POA: Insufficient documentation

## 2016-02-24 LAB — POCT WET PREP (WET MOUNT)
Clue Cells Wet Prep Whiff POC: NEGATIVE
WBC, Wet Prep HPF POC: POSITIVE

## 2016-02-24 MED ORDER — METRONIDAZOLE 500 MG PO TABS: 500.0000 mg | ORAL_TABLET | Freq: Two times a day (BID) | ORAL | 0 refills | Status: DC

## 2016-02-24 NOTE — Patient Instructions (Signed)
Bacterial Vaginosis Bacterial vaginosis is a vaginal infection that occurs when the normal balance of bacteria in the vagina is disrupted. It results from an overgrowth of certain bacteria. This is the most common vaginal infection in women of childbearing age. Treatment is important to prevent complications, especially in pregnant women, as it can cause a premature delivery. CAUSES  Bacterial vaginosis is caused by an increase in harmful bacteria that are normally present in smaller amounts in the vagina. Several different kinds of bacteria can cause bacterial vaginosis. However, the reason that the condition develops is not fully understood. RISK FACTORS Certain activities or behaviors can put you at an increased risk of developing bacterial vaginosis, including:  Having a new sex partner or multiple sex partners.  Douching.  Using an intrauterine device (IUD) for contraception. Women do not get bacterial vaginosis from toilet seats, bedding, swimming pools, or contact with objects around them. SIGNS AND SYMPTOMS  Some women with bacterial vaginosis have no signs or symptoms. Common symptoms include:  Grey vaginal discharge.  A fishlike odor with discharge, especially after sexual intercourse.  Itching or burning of the vagina and vulva.  Burning or pain with urination. DIAGNOSIS  Your health care provider will take a medical history and examine the vagina for signs of bacterial vaginosis. A sample of vaginal fluid may be taken. Your health care provider will look at this sample under a microscope to check for bacteria and abnormal cells. A vaginal pH test may also be done.  TREATMENT  Bacterial vaginosis may be treated with antibiotic medicines. These may be given in the form of a pill or a vaginal cream. A second round of antibiotics may be prescribed if the condition comes back after treatment. Because bacterial vaginosis increases your risk for sexually transmitted diseases, getting  treated can help reduce your risk for chlamydia, gonorrhea, HIV, and herpes. HOME CARE INSTRUCTIONS   Only take over-the-counter or prescription medicines as directed by your health care provider.  If antibiotic medicine was prescribed, take it as directed. Make sure you finish it even if you start to feel better.  Tell all sexual partners that you have a vaginal infection. They should see their health care provider and be treated if they have problems, such as a mild rash or itching.  During treatment, it is important that you follow these instructions:  Avoid sexual activity or use condoms correctly.  Do not douche.  Avoid alcohol as directed by your health care provider.  Avoid breastfeeding as directed by your health care provider. SEEK MEDICAL CARE IF:   Your symptoms are not improving after 3 days of treatment.  You have increased discharge or pain.  You have a fever. MAKE SURE YOU:   Understand these instructions.  Will watch your condition.  Will get help right away if you are not doing well or get worse. FOR MORE INFORMATION  Centers for Disease Control and Prevention, Division of STD Prevention: AppraiserFraud.fi American Sexual Health Association (ASHA): www.ashastd.org    This information is not intended to replace advice given to you by your health care provider. Make sure you discuss any questions you have with your health care provider.   Document Released: 07/09/2005 Document Revised: 07/30/2014 Document Reviewed: 02/18/2013 Elsevier Interactive Patient Education 2016 Elsevier Inc. Take flagyl No alcohol Follow up prn

## 2016-02-24 NOTE — Progress Notes (Signed)
Subjective:     Patient ID: Melody Stewart, female   DOB: 30-Sep-1985, 30 y.o.   MRN: [id number]  HPI Melody Stewart is a 30 year old white female, in complaining of vaginal discharge and itching since Monday, she took a diflucan  Tuesday with out relief.She did have sex about a month ago with new partner and did not use a condom.She is using nuva ring.  Review of Systems +vaginal discharge +vaginal itching   Reviewed past medical,surgical, social and family history. Reviewed medications and allergies.  Objective:   Physical Exam BP 120/60 (BP Location: Left Arm, Patient Position: Sitting, Cuff Size: Normal)   Pulse 90   Ht 5\' 6"  (1.676 m)   Wt 164 lb 8 oz (74.6 kg)   LMP 02/10/2016   BMI 26.55 kg/m  Skin warm and dry.Pelvic: external genitalia is normal in appearance no lesions, vagina: white watery discharge without odor,nuva ring in place,urethra has no lesions or masses noted, cervix:smooth and bulbous, uterus: normal size, shape and contour, non tender, no masses felt, adnexa: no masses or tenderness noted. Bladder is non tender and no masses felt. Wet prep: + for clue cells and +WBCs. GC/CHL obtained.    Abdomen is soft and non tender.  Assessment:     Vaginal discharge  Vaginal itching BV    Plan:     Rx flagyl 500 mg 1 bid x 7 days, no alcohol, review handout on BV   GC/CHL sent Follow up prn

## 2016-02-28 LAB — GC/CHLAMYDIA PROBE AMP
Chlamydia trachomatis, NAA: NEGATIVE
Neisseria gonorrhoeae by PCR: NEGATIVE

## 2016-02-29 ENCOUNTER — Telehealth: Payer: Self-pay | Admitting: Adult Health

## 2016-02-29 NOTE — Telephone Encounter (Signed)
Pt informed to try Monistat OTC. Pt verbalized understanding.

## 2016-02-29 NOTE — Telephone Encounter (Signed)
Spoke with pt letting her know her GC/CHL was normal. Pt states she is still having vaginal itching and discharge. She is almost finished with Flagyl. Pt is some better but still having some symptoms. Please advise. Thanks!! [place]

## 2016-05-18 ENCOUNTER — Encounter: Payer: Self-pay | Admitting: Women's Health

## 2016-05-18 ENCOUNTER — Ambulatory Visit (INDEPENDENT_AMBULATORY_CARE_PROVIDER_SITE_OTHER): Payer: Federal, State, Local not specified - PPO | Admitting: Women's Health

## 2016-05-18 ENCOUNTER — Other Ambulatory Visit (HOSPITAL_COMMUNITY)
Admission: RE | Admit: 2016-05-18 | Discharge: 2016-05-18 | Disposition: A | Discharge status: Home / Self Care | Payer: Federal, State, Local not specified - PPO | Source: Ambulatory Visit | Attending: Obstetrics & Gynecology | Admitting: Obstetrics & Gynecology

## 2016-05-18 VITALS — BP 120/80 | HR 72 | Ht 66.0 in | Wt 180.0 lb

## 2016-05-18 DIAGNOSIS — Z01411 Encounter for gynecological examination (general) (routine) with abnormal findings: Secondary | ICD-10-CM | POA: Insufficient documentation

## 2016-05-18 DIAGNOSIS — Z01419 Encounter for gynecological examination (general) (routine) without abnormal findings: Secondary | ICD-10-CM | POA: Diagnosis not present

## 2016-05-18 DIAGNOSIS — Z1151 Encounter for screening for human papillomavirus (HPV): Secondary | ICD-10-CM | POA: Insufficient documentation

## 2016-05-18 DIAGNOSIS — Z113 Encounter for screening for infections with a predominantly sexual mode of transmission: Secondary | ICD-10-CM

## 2016-05-18 DIAGNOSIS — F418 Other specified anxiety disorders: Secondary | ICD-10-CM

## 2016-05-18 MED ORDER — ETONOGESTREL-ETHINYL ESTRADIOL 0.12-0.015 MG/24HR VA RING: VAGINAL_RING | VAGINAL | 4 refills | Status: DC

## 2016-05-18 NOTE — Progress Notes (Signed)
Subjective:   Melody Stewart is a 30 y.o. G0P0000 Caucasian female here for a routine well-woman exam.  Patient's last menstrual period was 04/24/2016.    Current complaints: none, needs refill on nuva ring and wellbutrin. Doing well on both. Denies SI/HI, functioning well.  PCP: Dr. 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  Past Medical History Past Medical History:  Diagnosis Date  . Anxiety and depression 09/14/2015  . Asthma   . BV (bacterial vaginosis) 02/24/2016  . Colitis   . Contraceptive management 05/18/2015  . Fibroadenoma of right breast   . GERD (gastroesophageal reflux disease)   . IBS (irritable bowel syndrome)   . Screening for STD (sexually transmitted disease) 09/14/2015  . Vaginal itching 09/14/2015    Past Surgical History Past Surgical History:  Procedure Laterality Date  . KNEE SURGERY      Gynecologic History G0P0000  Patient's last menstrual period was 04/24/2016. Contraception: nuvaring Last Pap: 05/18/15. Results were: normal, however she prefers another pap today Last mammogram: never. Results were: n/a, did have breast u/s in early 20s d/t lump Rt breast- was normal Last TCS: ~58yrs ago d/t IBS, had a few benign polyps  Obstetric History OB History  Gravida Para Term Preterm AB Living  0 0 0 0 0 0  SAB TAB Ectopic Multiple Live Births  0 0 0 0          Current Medications Current Outpatient Prescriptions on File Prior to Visit  Medication Sig Dispense Refill  .  buPROPion (WELLBUTRIN XL) 300 MG 24 hr tablet Take 1 tablet (300 mg total) by mouth daily. 30 tablet 6  . Cetirizine HCl (ZYRTEC ALLERGY PO) Take by mouth daily.    Marland Kitchen etonogestrel-ethinyl estradiol (NUVARING) 0.12-0.015 MG/24HR vaginal ring USE AS DIRECTED-INSERT INTO VAGINA FOR 25 DAYS AND TAKE OUT FOR 3 DAYS 3 each 4  . VENTOLIN HFA 108 (90 Base) MCG/ACT inhaler 2 puffs as needed.      No current facility-administered medications on file prior to visit.     Review of Systems Patient denies any headaches, blurred vision, shortness of breath, chest pain, abdominal pain, problems with bowel movements, urination, or intercourse.  Objective:  BP 120/80   Pulse 72   Ht 5\' 6"  (1.676 m)   Wt 180 lb (81.6 kg)   LMP 04/24/2016   BMI 29.05 kg/m  Physical Exam  General:  Well developed, well nourished, no acute distress. She is alert and oriented x3. Skin:  Warm and dry Neck:  Midline trachea, no thyromegaly or nodules Cardiovascular: Regular rate and rhythm, no murmur heard Lungs:  Effort normal, all lung fields clear to auscultation bilaterally Breasts:  No dominant palpable mass, retraction, or nipple discharge Abdomen:  Soft, non tender, no hepatosplenomegaly or masses Pelvic:  External genitalia is normal in appearance.  The vagina is normal in appearance. The cervix is bulbous, no CMT.  Thin prep pap is done w/ HR HPV cotesting. Uterus is felt to be normal size, shape, and contour.  No adnexal masses or tenderness noted. Extremities:  No swelling or varicosities noted Psych:  She has a normal mood and affect  Assessment:   Healthy well-woman exam Contraception management Depession/anxiety, doing well on wellbutrin STD screen  Plan:  GC/CT from pap, HIV, RPR, Hep B today Refilled nuvaring Have pharmacy send refill for wellbutrin F/U 8yr for physical, or sooner if needed Mammogram @30yo  or sooner if problems Colonoscopy per GI or sooner if problems  Tawnya Crook  CNM, WHNP-BC 05/18/2016 10:30 AM

## 2016-05-18 NOTE — Addendum Note (Signed)
Addended by: Diona Fanti A on: 05/18/2016 10:58 AM   Modules accepted: Orders

## 2016-05-19 LAB — HEPATITIS B SURFACE ANTIGEN: Hepatitis B Surface Ag: NEGATIVE

## 2016-05-19 LAB — RPR: RPR Ser Ql: NONREACTIVE

## 2016-05-19 LAB — HIV ANTIBODY (ROUTINE TESTING W REFLEX): HIV Screen 4th Generation wRfx: NONREACTIVE

## 2016-05-22 LAB — CYTOLOGY - PAP
CHLAMYDIA, DNA PROBE: NEGATIVE
DIAGNOSIS: NEGATIVE
HPV (WINDOPATH): DETECTED — AB
Neisseria Gonorrhea: NEGATIVE

## 2016-05-25 ENCOUNTER — Telehealth: Payer: Self-pay | Admitting: *Deleted

## 2016-05-25 ENCOUNTER — Encounter: Payer: Self-pay | Admitting: Women's Health

## 2016-05-25 DIAGNOSIS — R87619 Unspecified abnormal cytological findings in specimens from cervix uteri: Secondary | ICD-10-CM | POA: Insufficient documentation

## 2016-05-25 NOTE — Telephone Encounter (Signed)
Pt had several questions in regards to + HPV on pap. All questions answered and pt verbalized understanding.

## 2016-05-25 NOTE — Telephone Encounter (Signed)
Pt informed of need for 1 yr F/U PAP.  Pt verbalized understanding.

## 2016-07-25 DIAGNOSIS — K080 Exfoliation of teeth due to systemic causes: Secondary | ICD-10-CM | POA: Diagnosis not present

## 2016-08-06 DIAGNOSIS — K080 Exfoliation of teeth due to systemic causes: Secondary | ICD-10-CM | POA: Diagnosis not present

## 2016-09-05 DIAGNOSIS — K080 Exfoliation of teeth due to systemic causes: Secondary | ICD-10-CM | POA: Diagnosis not present

## 2016-10-25 DIAGNOSIS — R635 Abnormal weight gain: Secondary | ICD-10-CM | POA: Diagnosis not present

## 2016-10-25 DIAGNOSIS — R946 Abnormal results of thyroid function studies: Secondary | ICD-10-CM | POA: Diagnosis not present

## 2016-10-25 DIAGNOSIS — J302 Other seasonal allergic rhinitis: Secondary | ICD-10-CM | POA: Diagnosis not present

## 2016-10-25 DIAGNOSIS — R5382 Chronic fatigue, unspecified: Secondary | ICD-10-CM | POA: Diagnosis not present

## 2016-11-02 DIAGNOSIS — R946 Abnormal results of thyroid function studies: Secondary | ICD-10-CM | POA: Diagnosis not present

## 2016-11-12 DIAGNOSIS — K080 Exfoliation of teeth due to systemic causes: Secondary | ICD-10-CM | POA: Diagnosis not present

## 2016-11-27 ENCOUNTER — Other Ambulatory Visit: Payer: Self-pay | Admitting: Adult Health

## 2016-12-07 ENCOUNTER — Ambulatory Visit: Payer: Federal, State, Local not specified - PPO | Admitting: Obstetrics & Gynecology

## 2016-12-14 ENCOUNTER — Ambulatory Visit (INDEPENDENT_AMBULATORY_CARE_PROVIDER_SITE_OTHER): Payer: Federal, State, Local not specified - PPO | Admitting: Women's Health

## 2016-12-14 ENCOUNTER — Encounter: Payer: Self-pay | Admitting: Women's Health

## 2016-12-14 VITALS — BP 120/80 | HR 74 | Ht 66.0 in | Wt 202.0 lb

## 2016-12-14 DIAGNOSIS — R350 Frequency of micturition: Secondary | ICD-10-CM

## 2016-12-14 DIAGNOSIS — R300 Dysuria: Secondary | ICD-10-CM

## 2016-12-14 DIAGNOSIS — R21 Rash and other nonspecific skin eruption: Secondary | ICD-10-CM

## 2016-12-14 DIAGNOSIS — N898 Other specified noninflammatory disorders of vagina: Secondary | ICD-10-CM | POA: Diagnosis not present

## 2016-12-14 LAB — POCT URINALYSIS DIPSTICK
Blood, UA: NEGATIVE
Glucose, UA: NEGATIVE
KETONES UA: NEGATIVE
LEUKOCYTES UA: NEGATIVE
Nitrite, UA: NEGATIVE
Protein, UA: NEGATIVE

## 2016-12-14 LAB — POCT WET PREP (WET MOUNT)
Clue Cells Wet Prep Whiff POC: NEGATIVE
Trichomonas Wet Prep HPF POC: ABSENT

## 2016-12-14 MED ORDER — SULFAMETHOXAZOLE-TRIMETHOPRIM 400-80 MG PO TABS: 1.0000 | ORAL_TABLET | Freq: Two times a day (BID) | ORAL | 0 refills | Status: DC

## 2016-12-14 MED ORDER — PREDNISONE 20 MG PO TABS: 40.0000 mg | ORAL_TABLET | Freq: Every day | ORAL | 0 refills | Status: DC

## 2016-12-14 NOTE — Progress Notes (Signed)
   [facility] Visit  Patient name: Melody Stewart MRN [id number]  Date of birth: 1986/07/15  CC & HPI:  Melody Stewart is a 31 y.o. G0P0000 Caucasian female presenting today for report of burning w/ urination, urinary frequency and urgency x ~1wk. Took azo 1 day last week and developed bright red itchy rash on Lt upper thigh/waist area. Went to urgent care, given cortisone injection, rx for prednisone 40mg  x 6d, has also been using boyfriend's kenalog cream. Area has spread, but redness has lightened and is now looking purple. Denies tick/spider bite, etc. Had yeast infection about 2 weeks ago, took diflucan and feels better.  Patient's last menstrual period was 12/10/2016. The current method of family planning is NuvaRing vaginal inserts. Last pap 05/18/16 neg w/ +HRHPV  Pertinent History Reviewed:  Medical & Surgical Hx:   Past medical, surgical, family, and social history reviewed in electronic medical record Medications: Reviewed & Updated - see associated section Allergies: Reviewed in electronic medical record  Objective Findings:  Vitals: BP 120/80   Pulse 74   Ht 5\' 6"  (1.676 m)   Wt 202 lb (91.6 kg)   LMP 12/10/2016   BMI 32.60 kg/m  Body mass index is 32.6 kg/m.  Physical Examination: General appearance - alert, well appearing, and in no distress Pelvic - normal external genitalia Spec exam: cx closed, small amt whitish-pinkish d/c, coming off of period, nonodorous Lt waist/upper thigh area: large irregular reddish-purplish coalescing smooth rash, co-exam w/ LHE- d/t timing of onset after azo, feels must be related- recommends extending prednisone to total of 10d  Results for orders placed or performed in visit on 12/14/16 (from the past 24 hour(s))  POCT Wet Prep Lenard Forth [place])   Collection Time: 12/14/16 12:32 PM  Result Value Ref Range   Source Wet Prep POC wet prep    WBC, Wet Prep HPF POC few    Bacteria Wet Prep HPF POC None (A) Few   BACTERIA WET PREP  MORPHOLOGY POC     Clue Cells Wet Prep HPF POC None None   Clue Cells Wet Prep Whiff POC Negative Whiff    Yeast Wet Prep HPF POC None    KOH Wet Prep POC     Trichomonas Wet Prep HPF POC Absent Absent  POCT urinalysis dipstick   Collection Time: 12/14/16  1:26 PM  Result Value Ref Range   Color, UA     Clarity, UA     Glucose, UA neg    Bilirubin, UA     Ketones, UA neg    Spec Grav, UA  1.010 - 1.025   Blood, UA neg    pH, UA  5.0 - 8.0   Protein, UA neg    Urobilinogen, UA  0.2 or 1.0 E.U./dL   Nitrite, UA neg    Leukocytes, UA Negative Negative    Urine dipstick: neg  Assessment & Plan:  A:   Presumed UTI based on sx  Rash on Lt waist c/w reaction to med  P:  Rx bactrim bid x 7d for UTI  Send urine cx  GC/CT   Rx prednisone 40mg  x 4 more days (to total 10days)  Return for 10/27 or after for , Pap & physical.  Tawnya Crook CNM, WHNP-BC 12/14/2016 1:32 PM

## 2016-12-16 LAB — URINE CULTURE

## 2016-12-16 LAB — GC/CHLAMYDIA PROBE AMP
Chlamydia trachomatis, NAA: NEGATIVE
Neisseria gonorrhoeae by PCR: NEGATIVE

## 2017-02-11 DIAGNOSIS — K080 Exfoliation of teeth due to systemic causes: Secondary | ICD-10-CM | POA: Diagnosis not present

## 2017-05-22 ENCOUNTER — Other Ambulatory Visit (HOSPITAL_COMMUNITY)
Admission: RE | Admit: 2017-05-22 | Discharge: 2017-05-22 | Disposition: A | Discharge status: Home / Self Care | Payer: Federal, State, Local not specified - PPO | Source: Ambulatory Visit | Attending: Obstetrics & Gynecology | Admitting: Obstetrics & Gynecology

## 2017-05-22 ENCOUNTER — Ambulatory Visit (INDEPENDENT_AMBULATORY_CARE_PROVIDER_SITE_OTHER): Payer: Federal, State, Local not specified - PPO | Admitting: Women's Health

## 2017-05-22 ENCOUNTER — Encounter: Payer: Self-pay | Admitting: Women's Health

## 2017-05-22 VITALS — BP 112/70 | HR 68 | Ht 66.0 in | Wt 220.0 lb

## 2017-05-22 DIAGNOSIS — Z113 Encounter for screening for infections with a predominantly sexual mode of transmission: Secondary | ICD-10-CM | POA: Insufficient documentation

## 2017-05-22 DIAGNOSIS — Z01419 Encounter for gynecological examination (general) (routine) without abnormal findings: Secondary | ICD-10-CM | POA: Diagnosis not present

## 2017-05-22 NOTE — Progress Notes (Signed)
   WELL-WOMAN EXAMINATION Patient name: Melody Stewart MRN [id number]  Date of birth: 08/19/1985 Chief Complaint:   Gynecologic Exam  History of Present Illness:   Melody Stewart is a 31 y.o. G0P0000 Caucasian female being seen today for a routine well-woman exam.  Current complaints: none. Did come off of nuva ring couple of months ago, trying to conceive. Having regular periods. Not taking pnv. No other meds, has been off wellbutrin for awhile and doing well.   PCP: none, hers retired      does desire labs Patient's last menstrual period was 05/10/2017 (exact date). The current method of family planning is none Last pap 05/18/16. Results were: neg pap w/ +HRHPV Last mammogram: never. Results were: did have breast u/s in early 20s d/t lump Rt breast- was normal Last colonoscopy: ~3-109yrs ago d/t IBS. Results were: had a few benign polyps- was told to f/u at 64yrs  Review of Systems:   Pertinent items are noted in HPI Denies any headaches, blurred vision, fatigue, shortness of breath, chest pain, abdominal pain, abnormal vaginal discharge/itching/odor/irritation, problems with periods, bowel movements, urination, or intercourse unless otherwise stated above. Pertinent History Reviewed:  Reviewed past medical,surgical, social and family history.  Reviewed problem list, medications and allergies. Physical Assessment:   Vitals:   05/22/17 0929  BP: 112/70  Pulse: 68  Weight: 220 lb (99.8 kg)  Height: 5\' 6"  (1.676 m)  Body mass index is 35.51 kg/m.        Physical Examination:   General appearance - well appearing, and in no distress  Mental status - alert, oriented to person, place, and time  Psych:  She has a normal mood and affect  Skin - warm and dry, normal color, no suspicious lesions noted  Chest - effort normal, all lung fields clear to auscultation bilaterally  Heart - normal rate and regular rhythm  Neck:  midline trachea, no thyromegaly or nodules  Breasts - breasts  appear normal, no suspicious masses, no skin or nipple changes or  axillary nodes  Abdomen - soft, nontender, nondistended, no masses or organomegaly  Pelvic - VULVA: normal appearing vulva with no masses, tenderness or lesions  VAGINA: normal appearing vagina with normal color and discharge, no lesions  CERVIX: normal appearing cervix without discharge or lesions, no CMT  Thin prep pap is done w/ HR HPV cotesting  UTERUS: uterus is felt to be normal size, shape, consistency and nontender   ADNEXA: No adnexal masses or tenderness noted.  Extremities:  No swelling or varicosities noted  No results found for this or any previous visit (from the past 24 hour(s)).  Assessment & Plan:  1) Well-Woman Exam> gave printed list of local PCPs  2) Trying to conceive> begin pnv daily today, gave printed list of fertility tips, discussed can be normal to take up to 42yr, let us know if gets +PT  3) H/O IBS w/ TCS w/ few benign polyps> contact GI to see when needs to f/u, list given as she reports the one who did her TCS at [facility] has left  Mammogram @31yo  or sooner if problems Colonoscopy> per GI, or sooner if problems  Orders Placed This Encounter  Procedures  . CBC  . Comprehensive metabolic panel  . TSH  . Hemoglobin A1c  . HIV antibody  . RPR    Follow-up: Return in about 1 year (around 05/22/2018) for Physical.  Tawnya Crook CNM, WHNP-BC 05/22/2017 10:00 AM

## 2017-05-22 NOTE — Addendum Note (Signed)
Addended by: Christiana Pellant A on: 05/22/2017 10:35 AM   Modules accepted: Orders

## 2017-05-22 NOTE — Patient Instructions (Signed)
If you are trying to get pregnant:   Have sex every other day on days 7-24 of your cycle (Day 1 is the 1st day of your period)  [place] before sex  Lay with your hips elevated on pillows for 20-27mins after sex  Do not smoke or drink alcohol  Lose weight if you are overweight  Take a prenatal vitamin with at least 415AXE of folic acid  Decrease stress in your life  For Him:   Wear boxers instead of briefs  Avoid hot baths/jacuzzi  Vit C supplement  Do not smoke or drink alcohol  Lose weight if you are overweight  Keep in mind that it can be normal to take up to a year to become pregnant 80-90% of women will become pregnant within 1 year of trying   Primary Care Providers  Dr. Wende Neighbors [place]) [facility] [phone]  Primary Wellness Care [facility]) Dr. Anastasio Champion [phone]  [facility] [place]) [phone]  [facility]) [place] [phone]   Gastrointestinal (GI) [facility] (Dr. Gala Romney & Dr. Oneida Alar) [phone]  Dr. Laural Golden [phone]

## 2017-05-23 LAB — COMPREHENSIVE METABOLIC PANEL
ALT: 17 IU/L (ref 0–32)
AST: 18 IU/L (ref 0–40)
Albumin/Globulin Ratio: 1.8 (ref 1.2–2.2)
Albumin: 4.1 g/dL (ref 3.5–5.5)
Alkaline Phosphatase: 81 IU/L (ref 39–117)
BUN/Creatinine Ratio: 18 (ref 9–23)
BUN: 13 mg/dL (ref 6–20)
Bilirubin Total: 0.4 mg/dL (ref 0.0–1.2)
CALCIUM: 9.3 mg/dL (ref 8.7–10.2)
CHLORIDE: 104 mmol/L (ref 96–106)
CO2: 23 mmol/L (ref 20–29)
Creatinine, Ser: 0.72 mg/dL (ref 0.57–1.00)
GFR, EST AFRICAN AMERICAN: 128 mL/min/{1.73_m2} (ref >59)
GFR, EST NON AFRICAN AMERICAN: 111 mL/min/{1.73_m2} (ref >59)
GLUCOSE: 87 mg/dL (ref 65–99)
Globulin, Total: 2.3 g/dL (ref 1.5–4.5)
POTASSIUM: 4.4 mmol/L (ref 3.5–5.2)
Sodium: 141 mmol/L (ref 134–144)
TOTAL PROTEIN: 6.4 g/dL (ref 6.0–8.5)

## 2017-05-23 LAB — TSH: TSH: 1.01 u[IU]/mL (ref 0.450–4.500)

## 2017-05-23 LAB — HEMOGLOBIN A1C
ESTIMATED AVERAGE GLUCOSE: 91 mg/dL
HEMOGLOBIN A1C: 4.8 % (ref 4.8–5.6)

## 2017-05-23 LAB — CBC
Hematocrit: 43.8 % (ref 34.0–46.6)
Hemoglobin: 14.6 g/dL (ref 11.1–15.9)
MCH: 28.8 pg (ref 26.6–33.0)
MCHC: 33.3 g/dL (ref 31.5–35.7)
MCV: 86 fL (ref 79–97)
PLATELETS: 372 10*3/uL (ref 150–379)
RBC: 5.07 x10E6/uL (ref 3.77–5.28)
RDW: 13.2 % (ref 12.3–15.4)
WBC: 9.6 10*3/uL (ref 3.4–10.8)

## 2017-05-23 LAB — CYTOLOGY - PAP
ADEQUACY: ABSENT
CHLAMYDIA, DNA PROBE: NEGATIVE
DIAGNOSIS: NEGATIVE
HPV: NOT DETECTED
NEISSERIA GONORRHEA: NEGATIVE

## 2017-05-23 LAB — RPR: RPR Ser Ql: NONREACTIVE

## 2017-05-23 LAB — HIV ANTIBODY (ROUTINE TESTING W REFLEX): HIV Screen 4th Generation wRfx: NONREACTIVE

## 2017-08-30 ENCOUNTER — Encounter: Payer: Self-pay | Admitting: Adult Health

## 2017-08-30 ENCOUNTER — Ambulatory Visit: Payer: Federal, State, Local not specified - PPO | Admitting: Adult Health

## 2017-08-30 VITALS — BP 120/60 | HR 96 | Ht 66.0 in | Wt 215.5 lb

## 2017-08-30 DIAGNOSIS — N926 Irregular menstruation, unspecified: Secondary | ICD-10-CM

## 2017-08-30 DIAGNOSIS — Z3A01 Less than 8 weeks gestation of pregnancy: Secondary | ICD-10-CM

## 2017-08-30 DIAGNOSIS — Z3201 Encounter for pregnancy test, result positive: Secondary | ICD-10-CM | POA: Diagnosis not present

## 2017-08-30 DIAGNOSIS — R109 Unspecified abdominal pain: Secondary | ICD-10-CM | POA: Diagnosis not present

## 2017-08-30 DIAGNOSIS — R110 Nausea: Secondary | ICD-10-CM

## 2017-08-30 DIAGNOSIS — O3680X0 Pregnancy with inconclusive fetal viability, not applicable or unspecified: Secondary | ICD-10-CM

## 2017-08-30 LAB — POCT URINE PREGNANCY: PREG TEST UR: POSITIVE — AB

## 2017-08-30 NOTE — Patient Instructions (Signed)
First Trimester of Pregnancy The first trimester of pregnancy is from week 1 until the end of week 13 (months 1 through 3). A week after a sperm fertilizes an egg, the egg will implant on the wall of the uterus. This embryo will begin to develop into a baby. Genes from you and your partner will form the baby. The female genes will determine whether the baby will be a boy or a girl. At 6-8 weeks, the eyes and face will be formed, and the heartbeat can be seen on ultrasound. At the end of 12 weeks, all the baby's organs will be formed. Now that you are pregnant, you will want to do everything you can to have a healthy baby. Two of the most important things are to get good prenatal care and to follow your health care provider's instructions. Prenatal care is all the medical care you receive before the baby's birth. This care will help prevent, find, and treat any problems during the pregnancy and childbirth. Body changes during your first trimester Your body goes through many changes during pregnancy. The changes vary from woman to woman.  You may gain or lose a couple of pounds at first.  You may feel sick to your stomach (nauseous) and you may throw up (vomit). If the vomiting is uncontrollable, call your health care provider.  You may tire easily.  You may develop headaches that can be relieved by medicines. All medicines should be approved by your health care provider.  You may urinate more often. Painful urination may mean you have a bladder infection.  You may develop heartburn as a result of your pregnancy.  You may develop constipation because certain hormones are causing the muscles that push stool through your intestines to slow down.  You may develop hemorrhoids or swollen veins (varicose veins).  Your breasts may begin to grow larger and become tender. Your nipples may stick out more, and the tissue that surrounds them (areola) may become darker.  Your gums may bleed and may be  sensitive to brushing and flossing.  Dark spots or blotches (chloasma, mask of pregnancy) may develop on your face. This will likely fade after the baby is born.  Your menstrual periods will stop.  You may have a loss of appetite.  You may develop cravings for certain kinds of food.  You may have changes in your emotions from day to day, such as being excited to be pregnant or being concerned that something may go wrong with the pregnancy and baby.  You may have more vivid and strange dreams.  You may have changes in your hair. These can include thickening of your hair, rapid growth, and changes in texture. Some women also have hair loss during or after pregnancy, or hair that feels dry or thin. Your hair will most likely return to normal after your baby is born.  What to expect at prenatal visits During a routine prenatal visit:  You will be weighed to make sure you and the baby are growing normally.  Your blood pressure will be taken.  Your abdomen will be measured to track your baby's growth.  The fetal heartbeat will be listened to between weeks 10 and 14 of your pregnancy.  Test results from any previous visits will be discussed.  Your health care provider may ask you:  How you are feeling.  If you are feeling the baby move.  If you have had any abnormal symptoms, such as leaking fluid, bleeding, severe headaches,   or abdominal cramping.  If you are using any tobacco products, including cigarettes, chewing tobacco, and electronic cigarettes.  If you have any questions.  Other tests that may be performed during your first trimester include:  Blood tests to find your blood type and to check for the presence of any previous infections. The tests will also be used to check for low iron levels (anemia) and protein on red blood cells (Rh antibodies). Depending on your risk factors, or if you previously had diabetes during pregnancy, you may have tests to check for high blood  sugar that affects pregnant women (gestational diabetes).  Urine tests to check for infections, diabetes, or protein in the urine.  An ultrasound to confirm the proper growth and development of the baby.  Fetal screens for spinal cord problems (spina bifida) and Down syndrome.  HIV (human immunodeficiency virus) testing. Routine prenatal testing includes screening for HIV, unless you choose not to have this test.  You may need other tests to make sure you and the baby are doing well.  Follow these instructions at home: Medicines  Follow your health care provider's instructions regarding medicine use. Specific medicines may be either safe or unsafe to take during pregnancy.  Take a prenatal vitamin that contains at least 600 micrograms (mcg) of folic acid.  If you develop constipation, try taking a stool softener if your health care provider approves. Eating and drinking  Eat a balanced diet that includes fresh fruits and vegetables, whole grains, good sources of protein such as meat, eggs, or tofu, and low-fat dairy. Your health care provider will help you determine the amount of weight gain that is right for you.  Avoid raw meat and uncooked cheese. These carry germs that can cause birth defects in the baby.  Eating four or five small meals rather than three large meals a day may help relieve nausea and vomiting. If you start to feel nauseous, eating a few soda crackers can be helpful. Drinking liquids between meals, instead of during meals, also seems to help ease nausea and vomiting.  Limit foods that are high in fat and processed sugars, such as fried and sweet foods.  To prevent constipation: ? Eat foods that are high in fiber, such as fresh fruits and vegetables, whole grains, and beans. ? Drink enough fluid to keep your urine clear or pale yellow. Activity  Exercise only as directed by your health care provider. Most women can continue their usual exercise routine during  pregnancy. Try to exercise for 30 minutes at least 5 days a week. Exercising will help you: ? Control your weight. ? Stay in shape. ? Be prepared for labor and delivery.  Experiencing pain or cramping in the lower abdomen or lower back is a good sign that you should stop exercising. Check with your health care provider before continuing with normal exercises.  Try to avoid standing for long periods of time. Move your legs often if you must stand in one place for a long time.  Avoid heavy lifting.  Wear low-heeled shoes and practice good posture.  You may continue to have sex unless your health care provider tells you not to. Relieving pain and discomfort  Wear a good support bra to relieve breast tenderness.  Take warm sitz baths to soothe any pain or discomfort caused by hemorrhoids. Use hemorrhoid cream if your health care provider approves.  Rest with your legs elevated if you have leg cramps or low back pain.  If you develop   varicose veins in your legs, wear support hose. Elevate your feet for 15 minutes, 3-4 times a day. Limit salt in your diet. Prenatal care  Schedule your prenatal visits by the twelfth week of pregnancy. They are usually scheduled monthly at first, then more often in the last 2 months before delivery.  Write down your questions. Take them to your prenatal visits.  Keep all your prenatal visits as told by your health care provider. This is important. Safety  Wear your seat belt at all times when driving.  Make a list of emergency phone numbers, including numbers for family, friends, the hospital, and police and fire departments. General instructions  Ask your health care provider for a referral to a local prenatal education class. Begin classes no later than the beginning of month 6 of your pregnancy.  Ask for help if you have counseling or nutritional needs during pregnancy. Your health care provider can offer advice or refer you to specialists for help  with various needs.  Do not use hot tubs, steam rooms, or saunas.  Do not douche or use tampons or scented sanitary pads.  Do not cross your legs for long periods of time.  Avoid cat litter boxes and soil used by cats. These carry germs that can cause birth defects in the baby and possibly loss of the fetus by miscarriage or stillbirth.  Avoid all smoking, herbs, alcohol, and medicines not prescribed by your health care provider. Chemicals in these products affect the formation and growth of the baby.  Do not use any products that contain nicotine or tobacco, such as cigarettes and e-cigarettes. If you need help quitting, ask your health care provider. You may receive counseling support and other resources to help you quit.  Schedule a dentist appointment. At home, brush your teeth with a soft toothbrush and be gentle when you floss. Contact a health care provider if:  You have dizziness.  You have mild pelvic cramps, pelvic pressure, or nagging pain in the abdominal area.  You have persistent nausea, vomiting, or diarrhea.  You have a bad smelling vaginal discharge.  You have pain when you urinate.  You notice increased swelling in your face, hands, legs, or ankles.  You are exposed to fifth disease or chickenpox.  You are exposed to German measles (rubella) and have never had it. Get help right away if:  You have a fever.  You are leaking fluid from your vagina.  You have spotting or bleeding from your vagina.  You have severe abdominal cramping or pain.  You have rapid weight gain or loss.  You vomit blood or material that looks like coffee grounds.  You develop a severe headache.  You have shortness of breath.  You have any kind of trauma, such as from a fall or a car accident. Summary  The first trimester of pregnancy is from week 1 until the end of week 13 (months 1 through 3).  Your body goes through many changes during pregnancy. The changes vary from  woman to woman.  You will have routine prenatal visits. During those visits, your health care provider will examine you, discuss any test results you may have, and talk with you about how you are feeling. This information is not intended to replace advice given to you by your health care provider. Make sure you discuss any questions you have with your health care provider. Document Released: 07/03/2001 Document Revised: 06/20/2016 Document Reviewed: 06/20/2016 Elsevier Interactive Patient Education  2018 [employer]vier   Inc[employer]  

## 2017-08-30 NOTE — Progress Notes (Signed)
Subjective:     Patient ID: [name], female   DOB: 04/15/1986, 32 y.o.   MRN: [id number]  HPI Alexsandra is a 32 year old white female in for UPT, has missed a period and had 3+HPTs.  Review of Systems +missed period, with 3+HPTs +nasuea esp at night +crampin, no bleeding Reviewed past medical,surgical, social and family history. Reviewed medications and allergies.     Objective:   Physical Exam BP 120/60 (BP Location: Left Arm, Patient Position: Sitting, Cuff Size: Large)   Pulse 96   Ht 5\' 6"  (1.676 m)   Wt 215 lb 8 oz (97.8 kg)   LMP 07/20/2017   BMI 34.78 kg/m UPT+, about 5+6 weeks, by LMP with EDD 04/26/18.Skin warm and dry. Neck: mid line trachea, normal thyroid, good ROM, no lymphadenopathy noted. Lungs: clear to ausculation bilaterally. Cardiovascular: regular rate and rhythm.Abdomen is soft and non tender.    Assessment:     1. Pregnancy examination or test, positive result   2. Less than [redacted] weeks gestation of pregnancy   3. Encounter to determine fetal viability of pregnancy, single or unspecified fetus       Plan:     Continue PNV Return in 2 weeks for dating [place] Review handouts on First trimester and by [facility]  Eat often

## 2017-09-09 ENCOUNTER — Ambulatory Visit (INDEPENDENT_AMBULATORY_CARE_PROVIDER_SITE_OTHER): Payer: Federal, State, Local not specified - PPO

## 2017-09-09 DIAGNOSIS — Z3A01 Less than 8 weeks gestation of pregnancy: Secondary | ICD-10-CM

## 2017-09-09 DIAGNOSIS — O3680X0 Pregnancy with inconclusive fetal viability, not applicable or unspecified: Secondary | ICD-10-CM | POA: Diagnosis not present

## 2017-09-09 DIAGNOSIS — Z029 Encounter for administrative examinations, unspecified: Secondary | ICD-10-CM

## 2017-09-09 NOTE — Progress Notes (Signed)
[place] 6+3 wks,single IUP w/ys,positive fht 126 bpm,normal ovaries bilat,crl 6.88 mm,EDD 05/02/2018 BY [place]

## 2017-09-11 ENCOUNTER — Ambulatory Visit (INDEPENDENT_AMBULATORY_CARE_PROVIDER_SITE_OTHER): Payer: Federal, State, Local not specified - PPO | Admitting: Obstetrics and Gynecology

## 2017-09-11 ENCOUNTER — Encounter: Payer: Self-pay | Admitting: Obstetrics and Gynecology

## 2017-09-11 VITALS — BP 120/60 | HR 97 | Wt 214.0 lb

## 2017-09-11 DIAGNOSIS — Z331 Pregnant state, incidental: Secondary | ICD-10-CM

## 2017-09-11 DIAGNOSIS — L03112 Cellulitis of left axilla: Secondary | ICD-10-CM | POA: Diagnosis not present

## 2017-09-11 DIAGNOSIS — Z1389 Encounter for screening for other disorder: Secondary | ICD-10-CM

## 2017-09-11 LAB — POCT URINALYSIS DIPSTICK
Glucose, UA: NEGATIVE
KETONES UA: NEGATIVE
Leukocytes, UA: NEGATIVE
NITRITE UA: NEGATIVE
Protein, UA: NEGATIVE
RBC UA: NEGATIVE

## 2017-09-11 MED ORDER — CEPHALEXIN 500 MG PO CAPS: 500.0000 mg | ORAL_CAPSULE | Freq: Four times a day (QID) | ORAL | 0 refills | Status: DC

## 2017-09-11 NOTE — Progress Notes (Signed)
Patient ID: [name], female   DOB: [birth year], 32 y.o.   MRN: [id number]   [facility] Visit  @DATE @            Patient name: Melody Stewart MRN [id number]  Date of birth: September 24, 1985  CC & HPI:  Melody Stewart is a 32 y.o. female, currently pregnant, presenting today for multiple bumps/ bites around her left breast and arm pit that she first noticed Monday morning, 09/09/2017. She states that it first looked like a mosquito bite to her breast, but it spread to her armpit. She notes no one else in her house has similar symptoms. The patient does have a pet. She denies fever, chills or any other symptoms or complaints at this time.   ROS:  ROS +pregnant +small bumps/ bites to skin -fever -chills All systems are negative except as noted in the HPI and PMH.   Pertinent History Reviewed:   Reviewed: Significant for pregnant Medical         Past Medical History:  Diagnosis Date  . Anxiety and depression 09/14/2015  . Asthma   . BV (bacterial vaginosis) 02/24/2016  . Colitis   . Contraceptive management 05/18/2015  . Fibroadenoma of right breast   . GERD (gastroesophageal reflux disease)   . IBS (irritable bowel syndrome)   . Screening for STD (sexually transmitted disease) 09/14/2015  . Vaginal itching 09/14/2015                              Surgical Hx:    Past Surgical History:  Procedure Laterality Date  . KNEE SURGERY     Medications: Reviewed & Updated - see associated section                       Current Outpatient Medications:  .  CALCIUM PO, Take by mouth daily., Disp: , Rfl:  .  Prenatal Vit-Fe Fumarate-FA (PRENATAL VITAMIN PO), Take by mouth daily., Disp: , Rfl:  .  Docosahexaenoic Acid (DHA PO), Take by mouth daily., Disp: , Rfl:  .  VENTOLIN HFA 108 (90 Base) MCG/ACT inhaler, 2 puffs as needed. , Disp: , Rfl:    Social History: Reviewed -  reports that she has quit smoking. Her smoking use included cigarettes. she has never used smokeless  tobacco.  Objective Findings:  Vitals: Blood pressure 120/60, pulse 97, weight 214 lb (97.1 kg), last menstrual period 07/20/2017.  PHYSICAL EXAMINATION General appearance - alert, well appearing, and in no distress and oriented to person, place, and time Mental status - alert, oriented to person, place, and time, normal mood, behavior, speech, dress, motor activity, and thought processes, affect appropriate to mood Skin -there are 2 small 5 mm areas of erythema on the breast that may be surrounding a small insect bite or a hair follicle The axilla shows a 5 cm x 5 cm area of skin erythema with no fluctuance or palpable mass.  There is no adenopathy it is unclear if this is a skin irritation or a reactive erythema to underlying condition  PELVIC DEFERRED  Assessment & Plan:   A:  1. Soft Tissue Cellulitis folliculitis left axilla  P:  1. Rx Keflex 500 4 times daily times 7 days       topical Neosporin twice daily 2. F/u as scheduled for OB visit  By signing my name below, I, Margit Banda, attest that this documentation  has been prepared under the direction and in the presence of Jonnie Kind, MD. Electronically Signed: Margit Banda, Medical Scribe. 09/11/17. 11:14 AM.  I personally performed the services described in this documentation, which was SCRIBED in my presence. The recorded information has been reviewed and considered accurate. It has been edited as necessary during review. Jonnie Kind, MD

## 2017-09-12 ENCOUNTER — Telehealth: Payer: Self-pay | Admitting: Obstetrics & Gynecology

## 2017-09-12 NOTE — Telephone Encounter (Signed)
Patient states she gets yeast infections when taking antibiotics so asked if she could take probiotic while pregnant. Informed patient that was fine to take and increase diet in yogurt. Also advised if she did get yeast infection, she could use Monistat or generic to treat. Verbalized understanding.

## 2017-09-25 ENCOUNTER — Encounter: Payer: Self-pay | Admitting: Women's Health

## 2017-09-25 ENCOUNTER — Ambulatory Visit (INDEPENDENT_AMBULATORY_CARE_PROVIDER_SITE_OTHER): Payer: Federal, State, Local not specified - PPO | Admitting: Women's Health

## 2017-09-25 ENCOUNTER — Ambulatory Visit: Payer: Federal, State, Local not specified - PPO | Admitting: *Deleted

## 2017-09-25 VITALS — BP 112/70 | HR 95 | Wt 218.0 lb

## 2017-09-25 DIAGNOSIS — Z23 Encounter for immunization: Secondary | ICD-10-CM

## 2017-09-25 DIAGNOSIS — Z3A08 8 weeks gestation of pregnancy: Secondary | ICD-10-CM | POA: Diagnosis not present

## 2017-09-25 DIAGNOSIS — Z1389 Encounter for screening for other disorder: Secondary | ICD-10-CM

## 2017-09-25 DIAGNOSIS — Z3400 Encounter for supervision of normal first pregnancy, unspecified trimester: Secondary | ICD-10-CM | POA: Insufficient documentation

## 2017-09-25 DIAGNOSIS — O9989 Other specified diseases and conditions complicating pregnancy, childbirth and the puerperium: Secondary | ICD-10-CM

## 2017-09-25 DIAGNOSIS — Z3401 Encounter for supervision of normal first pregnancy, first trimester: Secondary | ICD-10-CM | POA: Diagnosis not present

## 2017-09-25 DIAGNOSIS — F418 Other specified anxiety disorders: Secondary | ICD-10-CM

## 2017-09-25 DIAGNOSIS — Z331 Pregnant state, incidental: Secondary | ICD-10-CM

## 2017-09-25 DIAGNOSIS — M545 Low back pain: Secondary | ICD-10-CM

## 2017-09-25 DIAGNOSIS — O99341 Other mental disorders complicating pregnancy, first trimester: Secondary | ICD-10-CM

## 2017-09-25 LAB — POCT URINALYSIS DIPSTICK
Blood, UA: NEGATIVE
Glucose, UA: NEGATIVE
Ketones, UA: NEGATIVE
LEUKOCYTES UA: NEGATIVE
NITRITE UA: NEGATIVE
PROTEIN UA: NEGATIVE

## 2017-09-25 NOTE — Progress Notes (Addendum)
INITIAL OBSTETRICAL VISIT Patient name: Melody Stewart MRN [id number]  Date of birth: 1986/02/07 Chief Complaint:   Initial Prenatal Visit (back pain)  History of Present Illness:   CHAMILLE Stewart is a 32 y.o. G47P0000 Caucasian female at [redacted]w[redacted]d by 6w u/s, with an Estimated Date of Delivery: 05/02/18 being seen today for her initial obstetrical visit.  Needs work note to not work >40hr/wk and note for today, works at [employer]. Her obstetrical history is significant for primigravida. H/O dep/anx, no meds in months, doing well.   Today she reports some LBP.  Patient's last menstrual period was 07/20/2017. Last pap 05/22/17. Results were: neg w/ -HRHPV Review of Systems:   Pertinent items are noted in HPI Denies cramping/contractions, leakage of fluid, vaginal bleeding, abnormal vaginal discharge w/ itching/odor/irritation, headaches, visual changes, shortness of breath, chest pain, abdominal pain, severe nausea/vomiting, or problems with urination or bowel movements unless otherwise stated above.  Pertinent History Reviewed:  Reviewed past medical,surgical, social, obstetrical and family history.  Reviewed problem list, medications and allergies. OB History  Gravida Para Term Preterm AB Living  1 0 0 0 0 0  SAB TAB Ectopic Multiple Live Births  0 0 0 0      # Outcome Date GA Lbr Len/2nd Weight Sex Delivery Anes PTL Lv  1 Current              Physical Assessment:   Vitals:   09/25/17 1029  BP: 112/70  Pulse: 95  Weight: 218 lb (98.9 kg)  Body mass index is 35.19 kg/m.       Physical Examination:  General appearance - well appearing, and in no distress  Mental status - alert, oriented to person, place, and time  Psych:  She has a normal mood and affect  Skin - warm and dry, normal color, no suspicious lesions noted  Chest - effort normal, all lung fields clear to auscultation bilaterally  Heart - normal rate and regular rhythm  Abdomen - soft,  nontender  Extremities:  No swelling or varicosities noted  Thin prep pap is not done   Fetal Heart Rate (bpm): +u/s via informal transabdominal u/s  Results for orders placed or performed in visit on 09/25/17 (from the past 24 hour(s))  POCT urinalysis dipstick   Collection Time: 09/25/17 10:45 AM  Result Value Ref Range   Color, UA     Clarity, UA     Glucose, UA neg    Bilirubin, UA     Ketones, UA neg    Spec Grav, UA  1.010 - 1.025   Blood, UA neg    pH, UA  5.0 - 8.0   Protein, UA neg    Urobilinogen, UA  0.2 or 1.0 E.U./dL   Nitrite, UA neg    Leukocytes, UA Negative Negative   Appearance     Odor      Assessment & Plan:  1) Low-Risk Pregnancy G1P0000 at [redacted]w[redacted]d with an Estimated Date of Delivery: 05/02/18   2) Initial OB visit  3) Low back pain> gave printed prevention/relief measures   4) Dep/anx> no meds, doing well  Meds: No orders of the defined types were placed in this encounter.  Work note given Initial labs obtained Continue prenatal vitamins Reviewed n/v relief measures and warning s/s to report Reviewed recommended weight gain based on pre-gravid BMI Encouraged well-balanced diet Genetic Screening discussed Integrated Screen: requested Cystic fibrosis screening discussed declined Ultrasound discussed; fetal survey: requested CCNC completed>PCM  not here, plans to apply for preg Mcaid Flu shot today  Follow-up: Return in about 4 weeks (around 10/25/2017) for LROB, US:NT+1stIT.   Orders Placed This Encounter  Procedures  . Urine Culture  . GC/Chlamydia Probe Amp  . Flu Vaccine QUAD 36+ mos IM  . Obstetric Panel, Including HIV  . Urinalysis, Routine w reflex microscopic  . Pain Management Screening Profile (10S)  . POCT urinalysis dipstick    Roma Schanz CNM, [facility] 09/25/2017 11:34 AM

## 2017-09-25 NOTE — Patient Instructions (Signed)
Melody Stewart, I greatly value your feedback.  If you receive a survey following your visit with [place] today, we appreciate you taking the time to fill it out.  Thanks, Melody Stewart, CNM, WHNP-BC   Nausea & Vomiting  Have saltine crackers or pretzels by your bed and eat a few bites before you raise your head out of bed in the morning  Eat small frequent meals throughout the day instead of large meals  Drink plenty of fluids throughout the day to stay hydrated, just don't drink a lot of fluids with your meals.  This can make your stomach fill up faster making you feel sick  Do not brush your teeth right after you eat  Products with real ginger are good for nausea, like ginger ale and ginger hard candy Make sure it says made with real ginger!  Sucking on sour candy like lemon heads is also good for nausea  If your prenatal vitamins make you nauseated, take them at night so you will sleep through the nausea  Sea Bands  If you feel like you need medicine for the nausea & vomiting please let us know  If you are unable to keep any fluids or food down please let us know   Constipation  Drink plenty of fluid, preferably water, throughout the day  Eat foods high in fiber such as fruits, vegetables, and grains  Exercise, such as walking, is a good way to keep your bowels regular  Drink warm fluids, especially warm prune juice, or decaf coffee  Eat a 1/2 cup of real oatmeal (not instant), 1/2 cup applesauce, and 1/2-1 cup warm prune juice every day  If needed, you may take Colace (docusate sodium) stool softener once or twice a day to help keep the stool soft. If you are pregnant, wait until you are out of your first trimester (12-14 weeks of pregnancy)  If you still are having problems with constipation, you may take Miralax once daily as needed to help keep your bowels regular.  If you are pregnant, wait until you are out of your first trimester (12-14 weeks of pregnancy)  For your  lower back pain you may:  Purchase a pregnancy belt from [employer], [place], [place], etc and wear it while you are up and about  Take warm baths  Use a heating pad to your lower back for no longer than 20 minutes at a time, and do not place near abdomen  Take tylenol as needed. Please follow directions on the bottle  Kinesiology tape (can get from sporting goods store), google how to tape belly for pregnancy     First Trimester of Pregnancy The first trimester of pregnancy is from week 1 until the end of week 12 (months 1 through 3). A week after a sperm fertilizes an egg, the egg will implant on the wall of the uterus. This embryo will begin to develop into a baby. Genes from you and your partner are forming the baby. The female genes determine whether the baby is a boy or a girl. At 6-8 weeks, the eyes and face are formed, and the heartbeat can be seen on ultrasound. At the end of 12 weeks, all the baby's organs are formed.  Now that you are pregnant, you will want to do everything you can to have a healthy baby. Two of the most important things are to get good prenatal care and to follow your health care provider's instructions. Prenatal care is all the medical  care you receive before the baby's birth. This care will help prevent, find, and treat any problems during the pregnancy and childbirth. BODY CHANGES Your body goes through many changes during pregnancy. The changes vary from woman to woman.   You may gain or lose a couple of pounds at first.  You may feel sick to your stomach (nauseous) and throw up (vomit). If the vomiting is uncontrollable, call your health care provider.  You may tire easily.  You may develop headaches that can be relieved by medicines approved by your health care provider.  You may urinate more often. Painful urination may mean you have a bladder infection.  You may develop heartburn as a result of your pregnancy.  You may develop constipation  because certain hormones are causing the muscles that push waste through your intestines to slow down.  You may develop hemorrhoids or swollen, bulging veins (varicose veins).  Your breasts may begin to grow larger and become tender. Your nipples may stick out more, and the tissue that surrounds them (areola) may become darker.  Your gums may bleed and may be sensitive to brushing and flossing.  Dark spots or blotches (chloasma, mask of pregnancy) may develop on your face. This will likely fade after the baby is born.  Your menstrual periods will stop.  You may have a loss of appetite.  You may develop cravings for certain kinds of food.  You may have changes in your emotions from day to day, such as being excited to be pregnant or being concerned that something may go wrong with the pregnancy and baby.  You may have more vivid and strange dreams.  You may have changes in your hair. These can include thickening of your hair, rapid growth, and changes in texture. Some women also have hair loss during or after pregnancy, or hair that feels dry or thin. Your hair will most likely return to normal after your baby is born. WHAT TO EXPECT AT YOUR PRENATAL VISITS During a routine prenatal visit:  You will be weighed to make sure you and the baby are growing normally.  Your blood pressure will be taken.  Your abdomen will be measured to track your baby's growth.  The fetal heartbeat will be listened to starting around week 10 or 12 of your pregnancy.  Test results from any previous visits will be discussed. Your health care provider may ask you:  How you are feeling.  If you are feeling the baby move.  If you have had any abnormal symptoms, such as leaking fluid, bleeding, severe headaches, or abdominal cramping.  If you have any questions. Other tests that may be performed during your first trimester include:  Blood tests to find your blood type and to check for the presence of  any previous infections. They will also be used to check for low iron levels (anemia) and Rh antibodies. Later in the pregnancy, blood tests for diabetes will be done along with other tests if problems develop.  Urine tests to check for infections, diabetes, or protein in the urine.  An ultrasound to confirm the proper growth and development of the baby.  An amniocentesis to check for possible genetic problems.  Fetal screens for spina bifida and Down syndrome.  You may need other tests to make sure you and the baby are doing well. HOME CARE INSTRUCTIONS  Medicines  Follow your health care provider's instructions regarding medicine use. Specific medicines may be either safe or unsafe to take during  pregnancy.  Take your prenatal vitamins as directed.  If you develop constipation, try taking a stool softener if your health care provider approves. Diet  Eat regular, well-balanced meals. Choose a variety of foods, such as meat or vegetable-based protein, fish, milk and low-fat dairy products, vegetables, fruits, and whole grain breads and cereals. Your health care provider will help you determine the amount of weight gain that is right for you.  Avoid raw meat and uncooked cheese. These carry germs that can cause birth defects in the baby.  Eating four or five small meals rather than three large meals a day may help relieve nausea and vomiting. If you start to feel nauseous, eating a few soda crackers can be helpful. Drinking liquids between meals instead of during meals also seems to help nausea and vomiting.  If you develop constipation, eat more high-fiber foods, such as fresh vegetables or fruit and whole grains. Drink enough fluids to keep your urine clear or pale yellow. Activity and Exercise  Exercise only as directed by your health care provider. Exercising will help you:  Control your weight.  Stay in shape.  Be prepared for labor and delivery.  Experiencing pain or  cramping in the lower abdomen or low back is a good sign that you should stop exercising. Check with your health care provider before continuing normal exercises.  Try to avoid standing for long periods of time. Move your legs often if you must stand in one place for a long time.  Avoid heavy lifting.  Wear low-heeled shoes, and practice good posture.  You may continue to have sex unless your health care provider directs you otherwise. Relief of Pain or Discomfort  Wear a good support bra for breast tenderness.   Take warm sitz baths to soothe any pain or discomfort caused by hemorrhoids. Use hemorrhoid cream if your health care provider approves.   Rest with your legs elevated if you have leg cramps or low back pain.  If you develop varicose veins in your legs, wear support hose. Elevate your feet for 15 minutes, 3-4 times a day. Limit salt in your diet. Prenatal Care  Schedule your prenatal visits by the twelfth week of pregnancy. They are usually scheduled monthly at first, then more often in the last 2 months before delivery.  Write down your questions. Take them to your prenatal visits.  Keep all your prenatal visits as directed by your health care provider. Safety  Wear your seat belt at all times when driving.  Make a list of emergency phone numbers, including numbers for family, friends, the hospital, and police and fire departments. General Tips  Ask your health care provider for a referral to a local prenatal education class. Begin classes no later than at the beginning of month 6 of your pregnancy.  Ask for help if you have counseling or nutritional needs during pregnancy. Your health care provider can offer advice or refer you to specialists for help with various needs.  Do not use hot tubs, steam rooms, or saunas.  Do not douche or use tampons or scented sanitary pads.  Do not cross your legs for long periods of time.  Avoid cat litter boxes and soil used by  cats. These carry germs that can cause birth defects in the baby and possibly loss of the fetus by miscarriage or stillbirth.  Avoid all smoking, herbs, alcohol, and medicines not prescribed by your health care provider. Chemicals in these affect the formation and growth of  the baby.  Schedule a dentist appointment. At home, brush your teeth with a soft toothbrush and be gentle when you floss. SEEK MEDICAL CARE IF:   You have dizziness.  You have mild pelvic cramps, pelvic pressure, or nagging pain in the abdominal area.  You have persistent nausea, vomiting, or diarrhea.  You have a bad smelling vaginal discharge.  You have pain with urination.  You notice increased swelling in your face, hands, legs, or ankles. SEEK IMMEDIATE MEDICAL CARE IF:   You have a fever.  You are leaking fluid from your vagina.  You have spotting or bleeding from your vagina.  You have severe abdominal cramping or pain.  You have rapid weight gain or loss.  You vomit blood or material that looks like coffee grounds.  You are exposed to [place] measles and have never had them.  You are exposed to fifth disease or chickenpox.  You develop a severe headache.  You have shortness of breath.  You have any kind of trauma, such as from a fall or a car accident. Document Released: 07/03/2001 Document Revised: 11/23/2013 Document Reviewed: 05/19/2013 [facility] Patient Information 2015 [place], [place]. This information is not intended to replace advice given to you by your health care provider. Make sure you discuss any questions you have with your health care provider.

## 2017-09-26 LAB — URINALYSIS, ROUTINE W REFLEX MICROSCOPIC
Bilirubin, UA: NEGATIVE
Glucose, UA: NEGATIVE
KETONES UA: NEGATIVE
LEUKOCYTES UA: NEGATIVE
Nitrite, UA: NEGATIVE
PROTEIN UA: NEGATIVE
RBC, UA: NEGATIVE
SPEC GRAV UA: 1.018 (ref 1.005–1.030)
Urobilinogen, Ur: 0.2 mg/dL (ref 0.2–1.0)
pH, UA: 7.5 (ref 5.0–7.5)

## 2017-09-26 LAB — OBSTETRIC PANEL, INCLUDING HIV
ANTIBODY SCREEN: NEGATIVE
BASOS: 0 %
Basophils Absolute: 0 10*3/uL (ref 0.0–0.2)
EOS (ABSOLUTE): 0.1 10*3/uL (ref 0.0–0.4)
Eos: 1 %
HEMATOCRIT: 43.2 % (ref 34.0–46.6)
HIV SCREEN 4TH GENERATION: NONREACTIVE
Hemoglobin: 14.5 g/dL (ref 11.1–15.9)
Hepatitis B Surface Ag: NEGATIVE
Immature Grans (Abs): 0 10*3/uL (ref 0.0–0.1)
Immature Granulocytes: 0 %
LYMPHS ABS: 1.9 10*3/uL (ref 0.7–3.1)
Lymphs: 22 %
MCH: 28.8 pg (ref 26.6–33.0)
MCHC: 33.6 g/dL (ref 31.5–35.7)
MCV: 86 fL (ref 79–97)
MONOCYTES: 6 %
MONOS ABS: 0.5 10*3/uL (ref 0.1–0.9)
NEUTROS ABS: 5.9 10*3/uL (ref 1.4–7.0)
Neutrophils: 71 %
PLATELETS: 406 10*3/uL — AB (ref 150–379)
RBC: 5.04 x10E6/uL (ref 3.77–5.28)
RDW: 13.2 % (ref 12.3–15.4)
RPR Ser Ql: NONREACTIVE
RUBELLA: 4.66 {index} (ref >0.99)
Rh Factor: POSITIVE
WBC: 8.4 10*3/uL (ref 3.4–10.8)

## 2017-09-27 LAB — PMP SCREEN PROFILE (10S), URINE
AMPHETAMINE SCREEN URINE: NEGATIVE ng/mL
BARBITURATE SCREEN URINE: NEGATIVE ng/mL
BENZODIAZEPINE SCREEN, URINE: NEGATIVE ng/mL
CANNABINOIDS UR QL SCN: NEGATIVE ng/mL
CREATININE(CRT), U: 108.7 mg/dL (ref 20.0–300.0)
Cocaine (Metab) Scrn, Ur: NEGATIVE ng/mL
METHADONE SCREEN, URINE: NEGATIVE ng/mL
OPIATE SCREEN URINE: NEGATIVE ng/mL
OXYCODONE+OXYMORPHONE UR QL SCN: NEGATIVE ng/mL
PH UR, DRUG SCRN: 7.2 (ref 4.5–8.9)
Phencyclidine Qn, Ur: NEGATIVE ng/mL
Propoxyphene Scrn, Ur: NEGATIVE ng/mL

## 2017-09-27 LAB — URINE CULTURE

## 2017-09-27 LAB — GC/CHLAMYDIA PROBE AMP
CHLAMYDIA, DNA PROBE: NEGATIVE
NEISSERIA GONORRHOEAE BY PCR: NEGATIVE

## 2017-09-30 ENCOUNTER — Telehealth: Payer: Self-pay | Admitting: *Deleted

## 2017-09-30 ENCOUNTER — Other Ambulatory Visit: Payer: Self-pay | Admitting: Women's Health

## 2017-09-30 DIAGNOSIS — R8271 Bacteriuria: Secondary | ICD-10-CM | POA: Insufficient documentation

## 2017-09-30 DIAGNOSIS — O99891 Other specified diseases and conditions complicating pregnancy: Secondary | ICD-10-CM

## 2017-09-30 DIAGNOSIS — O9989 Other specified diseases and conditions complicating pregnancy, childbirth and the puerperium: Principal | ICD-10-CM

## 2017-09-30 MED ORDER — CEPHALEXIN 500 MG PO CAPS: 500.0000 mg | ORAL_CAPSULE | Freq: Four times a day (QID) | ORAL | 0 refills | Status: DC

## 2017-09-30 NOTE — Telephone Encounter (Signed)
Informed patient urine culture showed UTI so Keflex was sent to pharmacy. Advised to take all of the medication. Pt verbalized understanding.

## 2017-10-18 ENCOUNTER — Other Ambulatory Visit: Payer: Self-pay | Admitting: Women's Health

## 2017-10-18 DIAGNOSIS — Z3682 Encounter for antenatal screening for nuchal translucency: Secondary | ICD-10-CM

## 2017-10-21 ENCOUNTER — Ambulatory Visit (INDEPENDENT_AMBULATORY_CARE_PROVIDER_SITE_OTHER): Payer: Federal, State, Local not specified - PPO | Admitting: Women's Health

## 2017-10-21 ENCOUNTER — Encounter: Payer: Self-pay | Admitting: Women's Health

## 2017-10-21 ENCOUNTER — Ambulatory Visit (INDEPENDENT_AMBULATORY_CARE_PROVIDER_SITE_OTHER): Payer: Federal, State, Local not specified - PPO

## 2017-10-21 VITALS — BP 110/60 | HR 98 | Wt 221.6 lb

## 2017-10-21 DIAGNOSIS — Z1379 Encounter for other screening for genetic and chromosomal anomalies: Secondary | ICD-10-CM

## 2017-10-21 DIAGNOSIS — R3915 Urgency of urination: Secondary | ICD-10-CM

## 2017-10-21 DIAGNOSIS — O9989 Other specified diseases and conditions complicating pregnancy, childbirth and the puerperium: Secondary | ICD-10-CM

## 2017-10-21 DIAGNOSIS — Z3682 Encounter for antenatal screening for nuchal translucency: Secondary | ICD-10-CM

## 2017-10-21 DIAGNOSIS — Z3A12 12 weeks gestation of pregnancy: Secondary | ICD-10-CM

## 2017-10-21 DIAGNOSIS — Z3401 Encounter for supervision of normal first pregnancy, first trimester: Secondary | ICD-10-CM

## 2017-10-21 DIAGNOSIS — O2341 Unspecified infection of urinary tract in pregnancy, first trimester: Secondary | ICD-10-CM

## 2017-10-21 NOTE — Progress Notes (Signed)
   LOW-RISK PREGNANCY VISIT Patient name: Melody Stewart MRN [id number]  Date of birth: [birth year] Chief Complaint:   Routine Prenatal Visit (NT/ 1IT)  History of Present Illness:   Melody Stewart is a 32 y.o. G51P0000 female at [redacted]w[redacted]d with an Estimated Date of Delivery: 05/02/18 being seen today for ongoing management of a low-risk pregnancy.  Today she reports some urinary frequency, no dysuria. Did complete antibiotics after dx of asymptomatic bacteruria last visit.  Movement: Absent. denies leaking of fluid. Review of Systems:   Pertinent items are noted in HPI Denies abnormal vaginal discharge w/ itching/odor/irritation, headaches, visual changes, shortness of breath, chest pain, abdominal pain, severe nausea/vomiting, or problems with urination or bowel movements unless otherwise stated above. Pertinent History Reviewed:  Reviewed past medical,surgical, social, obstetrical and family history.  Reviewed problem list, medications and allergies. Physical Assessment:   Vitals:   10/21/17 0907  BP: 110/60  Pulse: 98  Weight: 221 lb 9.6 oz (100.5 kg)  Body mass index is 35.77 kg/m.        Physical Examination:   General appearance: Well appearing, and in no distress  Mental status: Alert, oriented to person, place, and time  Skin: Warm & dry  Cardiovascular: Normal heart rate noted  Respiratory: Normal respiratory effort, no distress  Abdomen: Soft, gravid, nontender  Pelvic: Cervical exam deferred         Extremities: Edema: None  Fetal Status: Fetal Heart Rate (bpm): +U/S   Movement: Absent    [place] 12+3 wks,measurements c/w dates,normal ovaries bilat,anterior pl gr 0,NB present,NT 1.4 mm,crl 63.43 mm  No results found for this or any previous visit (from the past 24 hour(s)).  Assessment & Plan:  1) Low-risk pregnancy G1P0000 at [redacted]w[redacted]d with an Estimated Date of Delivery: 05/02/18   2) Urinary frequency, ASB last visit> will resend urine cx   Meds: No orders of the defined  types were placed in this encounter.  Labs/procedures today: nt/it  Plan:  Continue routine obstetrical care   Reviewed: Preterm labor symptoms and general obstetric precautions including but not limited to vaginal bleeding, contractions, leaking of fluid and fetal movement were reviewed in detail with the patient.  All questions were answered  Follow-up: Return in about 1 month (around 11/18/2017) for [place], 2nd IT.  Orders Placed This Encounter  Procedures  . Urine Culture  . Integrated 1   [place], [facility] 10/21/2017 10:44 AM

## 2017-10-21 NOTE — Patient Instructions (Signed)
Melody Stewart, I greatly value your feedback.  If you receive a survey following your visit with [place] today, we appreciate you taking the time to fill it out.  Thanks, Knute Neu, CNM, WHNP-BC   Second Trimester of Pregnancy The second trimester is from week 14 through week 27 (months 4 through 6). The second trimester is often a time when you feel your best. Your body has adjusted to being pregnant, and you begin to feel better physically. Usually, morning sickness has lessened or quit completely, you may have more energy, and you may have an increase in appetite. The second trimester is also a time when the fetus is growing rapidly. At the end of the sixth month, the fetus is about 9 inches long and weighs about 1 pounds. You will likely begin to feel the baby move (quickening) between 16 and 20 weeks of pregnancy. Body changes during your second trimester Your body continues to go through many changes during your second trimester. The changes vary from woman to woman.  Your weight will continue to increase. You will notice your lower abdomen bulging out.  You may begin to get stretch marks on your hips, abdomen, and breasts.  You may develop headaches that can be relieved by medicines. The medicines should be approved by your health care provider.  You may urinate more often because the fetus is pressing on your bladder.  You may develop or continue to have heartburn as a result of your pregnancy.  You may develop constipation because certain hormones are causing the muscles that push waste through your intestines to slow down.  You may develop hemorrhoids or swollen, bulging veins (varicose veins).  You may have back pain. This is caused by: ? Weight gain. ? Pregnancy hormones that are relaxing the joints in your pelvis. ? A shift in weight and the muscles that support your balance.  Your breasts will continue to grow and they will continue to become tender.  Your gums may bleed  and may be sensitive to brushing and flossing.  Dark spots or blotches (chloasma, mask of pregnancy) may develop on your face. This will likely fade after the baby is born.  A dark line from your belly button to the pubic area (linea nigra) may appear. This will likely fade after the baby is born.  You may have changes in your hair. These can include thickening of your hair, rapid growth, and changes in texture. Some women also have hair loss during or after pregnancy, or hair that feels dry or thin. Your hair will most likely return to normal after your baby is born.  What to expect at prenatal visits During a routine prenatal visit:  You will be weighed to make sure you and the fetus are growing normally.  Your blood pressure will be taken.  Your abdomen will be measured to track your baby's growth.  The fetal heartbeat will be listened to.  Any test results from the previous visit will be discussed.  Your health care provider may ask you:  How you are feeling.  If you are feeling the baby move.  If you have had any abnormal symptoms, such as leaking fluid, bleeding, severe headaches, or abdominal cramping.  If you are using any tobacco products, including cigarettes, chewing tobacco, and electronic cigarettes.  If you have any questions.  Other tests that may be performed during your second trimester include:  Blood tests that check for: ? Low iron levels (anemia). ? High  blood sugar that affects pregnant women (gestational diabetes) between 3 and 28 weeks. ? Rh antibodies. This is to check for a protein on red blood cells (Rh factor).  Urine tests to check for infections, diabetes, or protein in the urine.  An ultrasound to confirm the proper growth and development of the baby.  An amniocentesis to check for possible genetic problems.  Fetal screens for spina bifida and Down syndrome.  HIV (human immunodeficiency virus) testing. Routine prenatal testing includes  screening for HIV, unless you choose not to have this test.  Follow these instructions at home: Medicines  Follow your health care provider's instructions regarding medicine use. Specific medicines may be either safe or unsafe to take during pregnancy.  Take a prenatal vitamin that contains at least 600 micrograms (mcg) of folic acid.  If you develop constipation, try taking a stool softener if your health care provider approves. Eating and drinking  Eat a balanced diet that includes fresh fruits and vegetables, whole grains, good sources of protein such as meat, eggs, or tofu, and low-fat dairy. Your health care provider will help you determine the amount of weight gain that is right for you.  Avoid raw meat and uncooked cheese. These carry germs that can cause birth defects in the baby.  If you have low calcium intake from food, talk to your health care provider about whether you should take a daily calcium supplement.  Limit foods that are high in fat and processed sugars, such as fried and sweet foods.  To prevent constipation: ? Drink enough fluid to keep your urine clear or pale yellow. ? Eat foods that are high in fiber, such as fresh fruits and vegetables, whole grains, and beans. Activity  Exercise only as directed by your health care provider. Most women can continue their usual exercise routine during pregnancy. Try to exercise for 30 minutes at least 5 days a week. Stop exercising if you experience uterine contractions.  Avoid heavy lifting, wear low heel shoes, and practice good posture.  A sexual relationship may be continued unless your health care provider directs you otherwise. Relieving pain and discomfort  Wear a good support bra to prevent discomfort from breast tenderness.  Take warm sitz baths to soothe any pain or discomfort caused by hemorrhoids. Use hemorrhoid cream if your health care provider approves.  Rest with your legs elevated if you have leg cramps  or low back pain.  If you develop varicose veins, wear support hose. Elevate your feet for 15 minutes, 3-4 times a day. Limit salt in your diet. Prenatal Care  Write down your questions. Take them to your prenatal visits.  Keep all your prenatal visits as told by your health care provider. This is important. Safety  Wear your seat belt at all times when driving.  Make a list of emergency phone numbers, including numbers for family, friends, the hospital, and police and fire departments. General instructions  Ask your health care provider for a referral to a local prenatal education class. Begin classes no later than the beginning of month 6 of your pregnancy.  Ask for help if you have counseling or nutritional needs during pregnancy. Your health care provider can offer advice or refer you to specialists for help with various needs.  Do not use hot tubs, steam rooms, or saunas.  Do not douche or use tampons or scented sanitary pads.  Do not cross your legs for long periods of time.  Avoid cat litter boxes and soil  used by cats. These carry germs that can cause birth defects in the baby and possibly loss of the fetus by miscarriage or stillbirth.  Avoid all smoking, herbs, alcohol, and unprescribed drugs. Chemicals in these products can affect the formation and growth of the baby.  Do not use any products that contain nicotine or tobacco, such as cigarettes and e-cigarettes. If you need help quitting, ask your health care provider.  Visit your dentist if you have not gone yet during your pregnancy. Use a soft toothbrush to brush your teeth and be gentle when you floss. Contact a health care provider if:  You have dizziness.  You have mild pelvic cramps, pelvic pressure, or nagging pain in the abdominal area.  You have persistent nausea, vomiting, or diarrhea.  You have a bad smelling vaginal discharge.  You have pain when you urinate. Get help right away if:  You have a  fever.  You are leaking fluid from your vagina.  You have spotting or bleeding from your vagina.  You have severe abdominal cramping or pain.  You have rapid weight gain or weight loss.  You have shortness of breath with chest pain.  You notice sudden or extreme swelling of your face, hands, ankles, feet, or legs.  You have not felt your baby move in over an hour.  You have severe headaches that do not go away when you take medicine.  You have vision changes. Summary  The second trimester is from week 14 through week 27 (months 4 through 6). It is also a time when the fetus is growing rapidly.  Your body goes through many changes during pregnancy. The changes vary from woman to woman.  Avoid all smoking, herbs, alcohol, and unprescribed drugs. These chemicals affect the formation and growth your baby.  Do not use any tobacco products, such as cigarettes, chewing tobacco, and e-cigarettes. If you need help quitting, ask your health care provider.  Contact your health care provider if you have any questions. Keep all prenatal visits as told by your health care provider. This is important. This information is not intended to replace advice given to you by your health care provider. Make sure you discuss any questions you have with your health care provider. Document Released: 07/03/2001 Document Revised: 12/15/2015 Document Reviewed: 09/09/2012 Elsevier Interactive Patient Education  2017 [employer].

## 2017-10-21 NOTE — Progress Notes (Signed)
[place] 12+3 wks,measurements c/w dates,normal ovaries bilat,anterior pl gr 0,NB present,NT 1.4 mm,crl 63.43 mm

## 2017-10-23 LAB — INTEGRATED 1
CROWN RUMP LENGTH MAT SCREEN: 63.4 mm
Gest. Age on Collection Date: 12.6 weeks
MATERNAL AGE AT EDD: 33.3 a
NUMBER OF FETUSES: 1
Nuchal Translucency (NT): 1.4 mm
PAPP-A Value: 774.9 ng/mL
Weight: 222 [lb_av]

## 2017-10-24 ENCOUNTER — Other Ambulatory Visit: Payer: Self-pay | Admitting: Women's Health

## 2017-10-24 ENCOUNTER — Telehealth: Payer: Self-pay | Admitting: *Deleted

## 2017-10-24 DIAGNOSIS — O9989 Other specified diseases and conditions complicating pregnancy, childbirth and the puerperium: Principal | ICD-10-CM

## 2017-10-24 DIAGNOSIS — O99891 Other specified diseases and conditions complicating pregnancy: Secondary | ICD-10-CM

## 2017-10-24 DIAGNOSIS — R8271 Bacteriuria: Secondary | ICD-10-CM

## 2017-10-24 LAB — URINE CULTURE

## 2017-10-24 MED ORDER — NITROFURANTOIN MONOHYD MACRO 100 MG PO CAPS: 100.0000 mg | ORAL_CAPSULE | Freq: Two times a day (BID) | ORAL | 0 refills | Status: DC

## 2017-10-24 NOTE — Telephone Encounter (Signed)
Informed patient urine culture showed UTI and Macrobid was sent to pharmacy.  Advised to take all of the medication even if her symptoms are gone.  Verbalized understanding.

## 2017-11-11 ENCOUNTER — Other Ambulatory Visit: Payer: Federal, State, Local not specified - PPO

## 2017-11-11 ENCOUNTER — Telehealth: Payer: Self-pay | Admitting: *Deleted

## 2017-11-11 ENCOUNTER — Other Ambulatory Visit: Payer: Self-pay | Admitting: Women's Health

## 2017-11-11 ENCOUNTER — Encounter: Payer: Self-pay | Admitting: *Deleted

## 2017-11-11 DIAGNOSIS — R319 Hematuria, unspecified: Secondary | ICD-10-CM

## 2017-11-11 DIAGNOSIS — R350 Frequency of micturition: Secondary | ICD-10-CM

## 2017-11-11 DIAGNOSIS — Z1389 Encounter for screening for other disorder: Secondary | ICD-10-CM

## 2017-11-11 DIAGNOSIS — Z331 Pregnant state, incidental: Secondary | ICD-10-CM

## 2017-11-11 MED ORDER — SULFAMETHOXAZOLE-TRIMETHOPRIM 800-160 MG PO TABS: 1.0000 | ORAL_TABLET | Freq: Two times a day (BID) | ORAL | 0 refills | Status: DC

## 2017-11-11 NOTE — Progress Notes (Signed)
Pt called w/ uti sx, rx bactrim, come leave urine specimen prior to starting meds. If urine cx pos, will start nightly suppression after finished w/ tx d/t this being 3rd uti during pregnancy.  Roma Schanz, CNM, [facility] 11/11/2017 11:08 AM

## 2017-11-11 NOTE — Progress Notes (Signed)
Pt came by office and left a urine sample. Urine dip showed a trace of blood. Urine sent to lab for culture per Maudie Mercury, CNM. Pt having urinary frequency. Bactrim sent to pharmacy also. Pt aware. [place]

## 2017-11-11 NOTE — Telephone Encounter (Signed)
Patient states over the weekend she has had urinary frequency, cloudy urine and has an odor.  Took all of the antibiotic prescribed for UTI earlier this month.  No fever but is having lower back pain. Please advise.

## 2017-11-11 NOTE — Telephone Encounter (Signed)
Informed patient to come leave urine culture so we can dip and send for culture.  Verbalized understanding

## 2017-11-12 ENCOUNTER — Telehealth: Payer: Self-pay | Admitting: Obstetrics & Gynecology

## 2017-11-13 ENCOUNTER — Other Ambulatory Visit: Payer: Self-pay | Admitting: Women's Health

## 2017-11-13 ENCOUNTER — Encounter: Payer: Self-pay | Admitting: *Deleted

## 2017-11-13 ENCOUNTER — Telehealth: Payer: Self-pay | Admitting: Obstetrics & Gynecology

## 2017-11-13 DIAGNOSIS — R8271 Bacteriuria: Secondary | ICD-10-CM

## 2017-11-13 DIAGNOSIS — O9989 Other specified diseases and conditions complicating pregnancy, childbirth and the puerperium: Principal | ICD-10-CM

## 2017-11-13 DIAGNOSIS — O99891 Other specified diseases and conditions complicating pregnancy: Secondary | ICD-10-CM

## 2017-11-13 LAB — URINE CULTURE

## 2017-11-13 MED ORDER — CEPHALEXIN 500 MG PO CAPS: 500.0000 mg | ORAL_CAPSULE | Freq: Every day | ORAL | 6 refills | Status: DC

## 2017-11-20 ENCOUNTER — Encounter: Payer: Self-pay | Admitting: Women's Health

## 2017-11-20 ENCOUNTER — Other Ambulatory Visit: Payer: Self-pay

## 2017-11-20 ENCOUNTER — Ambulatory Visit (INDEPENDENT_AMBULATORY_CARE_PROVIDER_SITE_OTHER): Payer: Federal, State, Local not specified - PPO | Admitting: Women's Health

## 2017-11-20 VITALS — BP 108/64 | HR 97 | Wt 222.0 lb

## 2017-11-20 DIAGNOSIS — Z3A16 16 weeks gestation of pregnancy: Secondary | ICD-10-CM

## 2017-11-20 DIAGNOSIS — N898 Other specified noninflammatory disorders of vagina: Secondary | ICD-10-CM | POA: Diagnosis not present

## 2017-11-20 DIAGNOSIS — N390 Urinary tract infection, site not specified: Secondary | ICD-10-CM

## 2017-11-20 DIAGNOSIS — Z1389 Encounter for screening for other disorder: Secondary | ICD-10-CM

## 2017-11-20 DIAGNOSIS — Z1379 Encounter for other screening for genetic and chromosomal anomalies: Secondary | ICD-10-CM

## 2017-11-20 DIAGNOSIS — Z87440 Personal history of urinary (tract) infections: Secondary | ICD-10-CM | POA: Diagnosis not present

## 2017-11-20 DIAGNOSIS — O2342 Unspecified infection of urinary tract in pregnancy, second trimester: Secondary | ICD-10-CM

## 2017-11-20 DIAGNOSIS — Z331 Pregnant state, incidental: Secondary | ICD-10-CM

## 2017-11-20 DIAGNOSIS — O26892 Other specified pregnancy related conditions, second trimester: Secondary | ICD-10-CM | POA: Diagnosis not present

## 2017-11-20 DIAGNOSIS — Z3402 Encounter for supervision of normal first pregnancy, second trimester: Secondary | ICD-10-CM | POA: Diagnosis not present

## 2017-11-20 DIAGNOSIS — L299 Pruritus, unspecified: Secondary | ICD-10-CM | POA: Diagnosis not present

## 2017-11-20 DIAGNOSIS — Z363 Encounter for antenatal screening for malformations: Secondary | ICD-10-CM

## 2017-11-20 LAB — POCT URINALYSIS DIPSTICK
Glucose, UA: NEGATIVE
Ketones, UA: NEGATIVE
Leukocytes, UA: NEGATIVE
Nitrite, UA: NEGATIVE
Protein, UA: NEGATIVE

## 2017-11-20 LAB — POCT WET PREP (WET MOUNT)
Clue Cells Wet Prep Whiff POC: NEGATIVE
TRICHOMONAS WET PREP HPF POC: ABSENT

## 2017-11-20 NOTE — Patient Instructions (Addendum)
Melody Stewart, I greatly value your feedback.  If you receive a survey following your visit with [place] today, we appreciate you taking the time to fill it out.  Thanks, Knute Neu, CNM, WHNP-BC   Monistat 7 if needed for yeast (no sex while taking)  Second Trimester of Pregnancy The second trimester is from week 14 through week 27 (months 4 through 6). The second trimester is often a time when you feel your best. Your body has adjusted to being pregnant, and you begin to feel better physically. Usually, morning sickness has lessened or quit completely, you may have more energy, and you may have an increase in appetite. The second trimester is also a time when the fetus is growing rapidly. At the end of the sixth month, the fetus is about 9 inches long and weighs about 1 pounds. You will likely begin to feel the baby move (quickening) between 16 and 20 weeks of pregnancy. Body changes during your second trimester Your body continues to go through many changes during your second trimester. The changes vary from woman to woman.  Your weight will continue to increase. You will notice your lower abdomen bulging out.  You may begin to get stretch marks on your hips, abdomen, and breasts.  You may develop headaches that can be relieved by medicines. The medicines should be approved by your health care provider.  You may urinate more often because the fetus is pressing on your bladder.  You may develop or continue to have heartburn as a result of your pregnancy.  You may develop constipation because certain hormones are causing the muscles that push waste through your intestines to slow down.  You may develop hemorrhoids or swollen, bulging veins (varicose veins).  You may have back pain. This is caused by: ? Weight gain. ? Pregnancy hormones that are relaxing the joints in your pelvis. ? A shift in weight and the muscles that support your balance.  Your breasts will continue to grow and they  will continue to become tender.  Your gums may bleed and may be sensitive to brushing and flossing.  Dark spots or blotches (chloasma, mask of pregnancy) may develop on your face. This will likely fade after the baby is born.  A dark line from your belly button to the pubic area (linea nigra) may appear. This will likely fade after the baby is born.  You may have changes in your hair. These can include thickening of your hair, rapid growth, and changes in texture. Some women also have hair loss during or after pregnancy, or hair that feels dry or thin. Your hair will most likely return to normal after your baby is born.  What to expect at prenatal visits During a routine prenatal visit:  You will be weighed to make sure you and the fetus are growing normally.  Your blood pressure will be taken.  Your abdomen will be measured to track your baby's growth.  The fetal heartbeat will be listened to.  Any test results from the previous visit will be discussed.  Your health care provider may ask you:  How you are feeling.  If you are feeling the baby move.  If you have had any abnormal symptoms, such as leaking fluid, bleeding, severe headaches, or abdominal cramping.  If you are using any tobacco products, including cigarettes, chewing tobacco, and electronic cigarettes.  If you have any questions.  Other tests that may be performed during your second trimester include:  Blood  tests that check for: ? Low iron levels (anemia). ? High blood sugar that affects pregnant women (gestational diabetes) between 49 and 28 weeks. ? Rh antibodies. This is to check for a protein on red blood cells (Rh factor).  Urine tests to check for infections, diabetes, or protein in the urine.  An ultrasound to confirm the proper growth and development of the baby.  An amniocentesis to check for possible genetic problems.  Fetal screens for spina bifida and Down syndrome.  HIV (human  immunodeficiency virus) testing. Routine prenatal testing includes screening for HIV, unless you choose not to have this test.  Follow these instructions at home: Medicines  Follow your health care provider's instructions regarding medicine use. Specific medicines may be either safe or unsafe to take during pregnancy.  Take a prenatal vitamin that contains at least 600 micrograms (mcg) of folic acid.  If you develop constipation, try taking a stool softener if your health care provider approves. Eating and drinking  Eat a balanced diet that includes fresh fruits and vegetables, whole grains, good sources of protein such as meat, eggs, or tofu, and low-fat dairy. Your health care provider will help you determine the amount of weight gain that is right for you.  Avoid raw meat and uncooked cheese. These carry germs that can cause birth defects in the baby.  If you have low calcium intake from food, talk to your health care provider about whether you should take a daily calcium supplement.  Limit foods that are high in fat and processed sugars, such as fried and sweet foods.  To prevent constipation: ? Drink enough fluid to keep your urine clear or pale yellow. ? Eat foods that are high in fiber, such as fresh fruits and vegetables, whole grains, and beans. Activity  Exercise only as directed by your health care provider. Most women can continue their usual exercise routine during pregnancy. Try to exercise for 30 minutes at least 5 days a week. Stop exercising if you experience uterine contractions.  Avoid heavy lifting, wear low heel shoes, and practice good posture.  A sexual relationship may be continued unless your health care provider directs you otherwise. Relieving pain and discomfort  Wear a good support bra to prevent discomfort from breast tenderness.  Take warm sitz baths to soothe any pain or discomfort caused by hemorrhoids. Use hemorrhoid cream if your health care  provider approves.  Rest with your legs elevated if you have leg cramps or low back pain.  If you develop varicose veins, wear support hose. Elevate your feet for 15 minutes, 3-4 times a day. Limit salt in your diet. Prenatal Care  Write down your questions. Take them to your prenatal visits.  Keep all your prenatal visits as told by your health care provider. This is important. Safety  Wear your seat belt at all times when driving.  Make a list of emergency phone numbers, including numbers for family, friends, the hospital, and police and fire departments. General instructions  Ask your health care provider for a referral to a local prenatal education class. Begin classes no later than the beginning of month 6 of your pregnancy.  Ask for help if you have counseling or nutritional needs during pregnancy. Your health care provider can offer advice or refer you to specialists for help with various needs.  Do not use hot tubs, steam rooms, or saunas.  Do not douche or use tampons or scented sanitary pads.  Do not cross your legs for  long periods of time.  Avoid cat litter boxes and soil used by cats. These carry germs that can cause birth defects in the baby and possibly loss of the fetus by miscarriage or stillbirth.  Avoid all smoking, herbs, alcohol, and unprescribed drugs. Chemicals in these products can affect the formation and growth of the baby.  Do not use any products that contain nicotine or tobacco, such as cigarettes and e-cigarettes. If you need help quitting, ask your health care provider.  Visit your dentist if you have not gone yet during your pregnancy. Use a soft toothbrush to brush your teeth and be gentle when you floss. Contact a health care provider if:  You have dizziness.  You have mild pelvic cramps, pelvic pressure, or nagging pain in the abdominal area.  You have persistent nausea, vomiting, or diarrhea.  You have a bad smelling vaginal  discharge.  You have pain when you urinate. Get help right away if:  You have a fever.  You are leaking fluid from your vagina.  You have spotting or bleeding from your vagina.  You have severe abdominal cramping or pain.  You have rapid weight gain or weight loss.  You have shortness of breath with chest pain.  You notice sudden or extreme swelling of your face, hands, ankles, feet, or legs.  You have not felt your baby move in over an hour.  You have severe headaches that do not go away when you take medicine.  You have vision changes. Summary  The second trimester is from week 14 through week 27 (months 4 through 6). It is also a time when the fetus is growing rapidly.  Your body goes through many changes during pregnancy. The changes vary from woman to woman.  Avoid all smoking, herbs, alcohol, and unprescribed drugs. These chemicals affect the formation and growth your baby.  Do not use any tobacco products, such as cigarettes, chewing tobacco, and e-cigarettes. If you need help quitting, ask your health care provider.  Contact your health care provider if you have any questions. Keep all prenatal visits as told by your health care provider. This is important. This information is not intended to replace advice given to you by your health care provider. Make sure you discuss any questions you have with your health care provider. Document Released: 07/03/2001 Document Revised: 12/15/2015 Document Reviewed: 09/09/2012 Elsevier Interactive Patient Education  2017 [employer].

## 2017-11-20 NOTE — Progress Notes (Signed)
LOW-RISK PREGNANCY VISIT Patient name: Melody Stewart MRN [id number]  Date of birth: May 30, 1986 Chief Complaint:   Routine Prenatal Visit (2nd IT)  History of Present Illness:   Melody Stewart is a 32 y.o. G64P0000 female at [redacted]w[redacted]d with an Estimated Date of Delivery: 05/02/18 being seen today for ongoing management of a low-risk pregnancy.  Today she reports watery discharge- no big gush or leaking down leg, no odor/itching/irritation. Has been itching all over body, worse in evening, some on soles of feet. Fasting this am. Finished bactrim for persistent uti, and started on keflex nightly suppression. Feels better. Going to beach Sunday for a week.  . Vag. Bleeding: None.  Movement: Absent. denies leaking of fluid. Review of Systems:   Pertinent items are noted in HPI Denies abnormal vaginal discharge w/ itching/odor/irritation, headaches, visual changes, shortness of breath, chest pain, abdominal pain, severe nausea/vomiting, or problems with urination or bowel movements unless otherwise stated above. Pertinent History Reviewed:  Reviewed past medical,surgical, social, obstetrical and family history.  Reviewed problem list, medications and allergies. Physical Assessment:   Vitals:   11/20/17 0837  BP: 108/64  Pulse: 97  Weight: 222 lb (100.7 kg)  Body mass index is 35.83 kg/m.        Physical Examination:   General appearance: Well appearing, and in no distress  Mental status: Alert, oriented to person, place, and time  Skin: Warm & dry  Cardiovascular: Normal heart rate noted  Respiratory: Normal respiratory effort, no distress  Abdomen: Soft, gravid, nontender  Pelvic: SSE: cx visually closed, no pooling, no change w/ valsalva, fern & nitrazine neg. Does have some white thin nonodorous d/c, no thick/clumpy d/c          Extremities: Edema: Trace  Fetal Status: Fetal Heart Rate (bpm): 153   Movement: Absent    Results for orders placed or performed in visit on 11/20/17 (from  the past 24 hour(s))  POCT urinalysis dipstick   Collection Time: 11/20/17  8:38 AM  Result Value Ref Range   Color, UA     Clarity, UA     Glucose, UA neg    Bilirubin, UA     Ketones, UA neg    Spec Grav, UA  1.010 - 1.025   Blood, UA trace    pH, UA  5.0 - 8.0   Protein, UA neg    Urobilinogen, UA  0.2 or 1.0 E.U./dL   Nitrite, UA neg    Leukocytes, UA Negative Negative   Appearance     Odor    POCT Wet Prep [facility])   Collection Time: 11/20/17  9:09 AM  Result Value Ref Range   Source Wet Prep POC vaginal    WBC, Wet Prep HPF POC none    Bacteria Wet Prep HPF POC Few Few   BACTERIA WET PREP MORPHOLOGY POC     Clue Cells Wet Prep HPF POC None None   Clue Cells Wet Prep Whiff POC Negative Whiff    Yeast Wet Prep HPF POC None    KOH Wet Prep POC     Trichomonas Wet Prep HPF POC Absent Absent    Assessment & Plan:  1) Low-risk pregnancy G1P0000 at [redacted]w[redacted]d with an Estimated Date of Delivery: 05/02/18   2) Persistent UTI, send urine cx poc today, continue nightly keflex suppression  3) Thin vaginal d/c> neg for ROM, doesn't appear to be yeast but has been on a lot of antibiotics lately, if develops sx of  yeast can use otc monistat 7, if any changes in sx can let us recheck. Discussed ROM warning s/s  4) Generalized pruritus> will check fasting bile acids and cmp today. Cool showers/wash cloths, avoid hot, can try eucerin/aquaphor, benadryl, hydrocortisone cream  5) Traveling> Discussed she is at a high risk for DVT/PE when pregnancy and traveling increases that risk. Recommended walking around q 1hr, don't cross legs, and dorsiflex feet often to help decrease r/f DVT/PE.      Meds: No orders of the defined types were placed in this encounter.  Labs/procedures today: 2nd IT, bile acids, CMP, urine cx   Plan:  Continue routine obstetrical care   Reviewed: Preterm labor symptoms and general obstetric precautions including but not limited to vaginal bleeding, contractions,  leaking of fluid and fetal movement were reviewed in detail with the patient.  All questions were answered  Follow-up: Return in about 2 weeks (around 12/04/2017) for LROB, [id number].  Orders Placed This Encounter  Procedures  . Urine Culture  . US OB Comp + 14 Wk  . INTEGRATED 2  . Comprehensive metabolic panel  . Bile acids, total  . POCT urinalysis dipstick  . POCT Wet Prep [facility] [place])   Roma Schanz CNM, [facility] 11/20/2017 9:15 AM

## 2017-11-21 ENCOUNTER — Telehealth: Payer: Self-pay | Admitting: *Deleted

## 2017-11-21 LAB — COMPREHENSIVE METABOLIC PANEL
ALBUMIN: 3.9 g/dL (ref 3.5–5.5)
ALT: 44 IU/L — AB (ref 0–32)
AST: 23 IU/L (ref 0–40)
Albumin/Globulin Ratio: 2 (ref 1.2–2.2)
Alkaline Phosphatase: 54 IU/L (ref 39–117)
BILIRUBIN TOTAL: 0.3 mg/dL (ref 0.0–1.2)
BUN / CREAT RATIO: 15 (ref 9–23)
BUN: 7 mg/dL (ref 6–20)
CHLORIDE: 106 mmol/L (ref 96–106)
CO2: 20 mmol/L (ref 20–29)
Calcium: 8.9 mg/dL (ref 8.7–10.2)
Creatinine, Ser: 0.46 mg/dL — ABNORMAL LOW (ref 0.57–1.00)
GFR calc non Af Amer: 132 mL/min/{1.73_m2} (ref >59)
GFR, EST AFRICAN AMERICAN: 152 mL/min/{1.73_m2} (ref >59)
GLUCOSE: 93 mg/dL (ref 65–99)
Globulin, Total: 2 g/dL (ref 1.5–4.5)
Potassium: 4.2 mmol/L (ref 3.5–5.2)
Sodium: 138 mmol/L (ref 134–144)
TOTAL PROTEIN: 5.9 g/dL — AB (ref 6.0–8.5)

## 2017-11-21 LAB — BILE ACIDS, TOTAL: BILE ACIDS TOTAL: 7.7 umol/L (ref 4.7–24.5)

## 2017-11-21 NOTE — Telephone Encounter (Signed)
Spoke with pt giving Kim's recommendations. Pt states she is taking a probiotic. Pt voiced understanding. [place]

## 2017-11-21 NOTE — Telephone Encounter (Signed)
Pt has had cramping. Last BM was normal, not diarrhea. Pt is cramping like she does have diarrhea. Pt has slept all day and stomach is sore to touch. Pt had diarrhea all night Tuesday night. Pt leaves for beach Sunday. Any further recommendations? I advised again good handwashing. Thanks!! [place]

## 2017-11-22 ENCOUNTER — Other Ambulatory Visit: Payer: Self-pay

## 2017-11-22 ENCOUNTER — Encounter: Payer: Federal, State, Local not specified - PPO | Admitting: Women's Health

## 2017-11-22 ENCOUNTER — Emergency Department (HOSPITAL_COMMUNITY)
Admission: EM | Admit: 2017-11-22 | Discharge: 2017-11-22 | Disposition: A | Discharge status: Home / Self Care | Payer: Federal, State, Local not specified - PPO | Attending: Emergency Medicine | Admitting: Emergency Medicine

## 2017-11-22 ENCOUNTER — Encounter (HOSPITAL_COMMUNITY): Payer: Self-pay

## 2017-11-22 DIAGNOSIS — Z87891 Personal history of nicotine dependence: Secondary | ICD-10-CM | POA: Diagnosis not present

## 2017-11-22 DIAGNOSIS — O26892 Other specified pregnancy related conditions, second trimester: Secondary | ICD-10-CM | POA: Diagnosis not present

## 2017-11-22 DIAGNOSIS — R197 Diarrhea, unspecified: Secondary | ICD-10-CM | POA: Insufficient documentation

## 2017-11-22 DIAGNOSIS — O9989 Other specified diseases and conditions complicating pregnancy, childbirth and the puerperium: Secondary | ICD-10-CM | POA: Diagnosis not present

## 2017-11-22 DIAGNOSIS — Z79899 Other long term (current) drug therapy: Secondary | ICD-10-CM | POA: Insufficient documentation

## 2017-11-22 DIAGNOSIS — R1030 Lower abdominal pain, unspecified: Secondary | ICD-10-CM | POA: Diagnosis not present

## 2017-11-22 DIAGNOSIS — Z3A17 17 weeks gestation of pregnancy: Secondary | ICD-10-CM | POA: Insufficient documentation

## 2017-11-22 DIAGNOSIS — J45909 Unspecified asthma, uncomplicated: Secondary | ICD-10-CM | POA: Diagnosis not present

## 2017-11-22 LAB — INTEGRATED 2
AFP MOM: 1.81
Alpha-Fetoprotein: 45 ng/mL
Crown Rump Length: 63.4 mm
DIA MoM: 1.22
DIA VALUE: 162.4 pg/mL
Estriol, Unconjugated: 0.66 ng/mL
GEST. AGE ON COLLECTION DATE: 12.6 wk
GESTATIONAL AGE: 16.9 wk
HCG VALUE: 40.5 [IU]/mL
MATERNAL AGE AT EDD: 33.3 a
NUCHAL TRANSLUCENCY (NT): 1.4 mm
Nuchal Translucency MoM: 0.99
Number of Fetuses: 1
PAPP-A MOM: 1.23
PAPP-A VALUE: 774.9 ng/mL
Test Results:: NEGATIVE
WEIGHT: 222 [lb_av]
Weight: 222 [lb_av]
hCG MoM: 1.88
uE3 MoM: 0.78

## 2017-11-22 LAB — COMPREHENSIVE METABOLIC PANEL
ALBUMIN: 3.2 g/dL — AB (ref 3.5–5.0)
ALK PHOS: 49 U/L (ref 38–126)
ALT: 51 U/L (ref 14–54)
ANION GAP: 9 (ref 5–15)
AST: 30 U/L (ref 15–41)
BILIRUBIN TOTAL: 0.6 mg/dL (ref 0.3–1.2)
BUN: 8 mg/dL (ref 6–20)
CO2: 19 mmol/L — ABNORMAL LOW (ref 22–32)
Calcium: 9.1 mg/dL (ref 8.9–10.3)
Chloride: 107 mmol/L (ref 101–111)
Creatinine, Ser: 0.48 mg/dL (ref 0.44–1.00)
GFR calc non Af Amer: >60 mL/min (ref >60)
Glucose, Bld: 120 mg/dL — ABNORMAL HIGH (ref 65–99)
POTASSIUM: 3.2 mmol/L — AB (ref 3.5–5.1)
SODIUM: 135 mmol/L (ref 135–145)
TOTAL PROTEIN: 6.3 g/dL — AB (ref 6.5–8.1)

## 2017-11-22 LAB — URINALYSIS, ROUTINE W REFLEX MICROSCOPIC
BILIRUBIN URINE: NEGATIVE
Glucose, UA: NEGATIVE mg/dL
KETONES UR: NEGATIVE mg/dL
LEUKOCYTES UA: NEGATIVE
NITRITE: NEGATIVE
PROTEIN: 30 mg/dL — AB
Specific Gravity, Urine: 1.024 (ref 1.005–1.030)
pH: 6 (ref 5.0–8.0)

## 2017-11-22 LAB — CBC
HEMATOCRIT: 38.8 % (ref 36.0–46.0)
HEMOGLOBIN: 13 g/dL (ref 12.0–15.0)
MCH: 28.9 pg (ref 26.0–34.0)
MCHC: 33.5 g/dL (ref 30.0–36.0)
MCV: 86.2 fL (ref 78.0–100.0)
Platelets: 378 10*3/uL (ref 150–400)
RBC: 4.5 MIL/uL (ref 3.87–5.11)
RDW: 13.5 % (ref 11.5–15.5)
WBC: 10.6 10*3/uL — ABNORMAL HIGH (ref 4.0–10.5)

## 2017-11-22 LAB — LIPASE, BLOOD: Lipase: 25 U/L (ref 11–51)

## 2017-11-22 LAB — URINE CULTURE: Organism ID, Bacteria: NO GROWTH

## 2017-11-22 MED ORDER — SODIUM CHLORIDE 0.9 % IV BOLUS
1000.0000 mL | Freq: Once | INTRAVENOUS | Status: AC
  Administered 2017-11-22: 1000 mL via INTRAVENOUS

## 2017-11-22 MED ORDER — POTASSIUM CHLORIDE CRYS ER 20 MEQ PO TBCR
40.0000 meq | EXTENDED_RELEASE_TABLET | Freq: Once | ORAL | Status: AC
  Administered 2017-11-22: 40 meq via ORAL
  Filled 2017-11-22: qty 2

## 2017-11-22 NOTE — Discharge Instructions (Addendum)
Follow-up with your OB/GYN doctor next week.  Drink plenty of fluids.  If you have heavy bleeding or severe pain or fever over the weekend then you should go to [facility]

## 2017-11-22 NOTE — ED Provider Notes (Signed)
[facility] EMERGENCY DEPARTMENT Provider Note   CSN: [id number] Arrival date & time: 11/22/17  1909     History   Chief Complaint Chief Complaint  Patient presents with  . Abdominal Pain    HPI Melody Stewart is a 32 y.o. female.  Patient complains of lower abdominal pain and diarrhea.  No fever no vomiting.  The history is provided by the patient. No language interpreter was used.  Abdominal Pain   This is a new problem. The current episode started more than 2 days ago. The problem occurs constantly. The problem has not changed since onset.The pain is associated with an unknown factor. The pain is located in the suprapubic region. The quality of the pain is aching. The pain is at a severity of 5/10. The pain is mild. Associated symptoms include diarrhea. Pertinent negatives include flatus, frequency, hematuria and headaches.    Past Medical History:  Diagnosis Date  . Anxiety and depression 09/14/2015  . Asthma   . BV (bacterial vaginosis) 02/24/2016  . Colitis   . Contraceptive management 05/18/2015  . Fibroadenoma of right breast   . GERD (gastroesophageal reflux disease)   . IBS (irritable bowel syndrome)   . Screening for STD (sexually transmitted disease) 09/14/2015  . Vaginal itching 09/14/2015    Patient Active Problem List   Diagnosis Date Noted  . Persistent UTI during pregnancy 09/30/2017  . Supervision of normal first pregnancy 09/25/2017  . Abnormal Pap smear of cervix 05/25/2016  . BV (bacterial vaginosis) 02/24/2016  . Anxiety and depression 09/14/2015    Past Surgical History:  Procedure Laterality Date  . KNEE SURGERY    . TONSILLECTOMY    . WISDOM TOOTH EXTRACTION       OB History    Gravida  1   Para  0   Term  0   Preterm  0   AB  0   Living  0     SAB  0   TAB  0   Ectopic  0   Multiple  0   Live Births               Home Medications    Prior to Admission medications   Medication Sig Start Date End Date Taking?  Authorizing Provider  CALCIUM PO Take 1 tablet by mouth daily.    Yes [provider]  cephALEXin (KEFLEX) 500 MG capsule Take 1 capsule (500 mg total) by mouth at bedtime. 11/13/17  Yes Roma Schanz, CNM  Docosahexaenoic Acid (DHA PO) Take 1 tablet by mouth daily.    Yes [provider]  Prenatal Vit-Fe Fumarate-FA (PRENATAL VITAMIN PO) Take 1 tablet by mouth daily.    Yes [provider]  Probiotic Product (PROBIOTIC DAILY PO) Take 1 tablet by mouth daily.   Yes [provider]  VENTOLIN HFA 108 (90 Base) MCG/ACT inhaler Inhale 2 puffs into the lungs every 6 (six) hours as needed for wheezing.  07/27/15  Yes [provider]    Family History Family History  Problem Relation Age of Onset  . Hypertension Mother   . Other Mother        mother had child hydrocephic-died  . Hypertension Father   . Heart disease Paternal Grandfather   . Stroke Paternal Grandfather   . Thyroid disease Maternal Grandmother   . Heart disease Maternal Grandmother   . Cancer Paternal Grandmother        breast  . Thyroid disease Maternal  Aunt   . Cancer Paternal Aunt        breast    Social History Social History   Tobacco Use  . Smoking status: Former Smoker    Types: Cigarettes  . Smokeless tobacco: Never Used  Substance Use Topics  . Alcohol use: No    Alcohol/week: 0.5 oz    Types: 1 Standard drinks or equivalent per week    Frequency: Never    Comment: occ; not now  . Drug use: No     Allergies   Hydrocodone   Review of Systems Review of Systems  Constitutional: Negative for appetite change and fatigue.  HENT: Negative for congestion, ear discharge and sinus pressure.   Eyes: Negative for discharge.  Respiratory: Negative for cough.   Cardiovascular: Negative for chest pain.  Gastrointestinal: Positive for abdominal pain and diarrhea. Negative for flatus.  Genitourinary: Negative for frequency and hematuria.  Musculoskeletal: Negative  for back pain.  Skin: Negative for rash.  Neurological: Negative for seizures and headaches.  Psychiatric/Behavioral: Negative for hallucinations.     Physical Exam Updated Vital Signs BP 129/66 (BP Location: Right Arm)   Pulse (!) 124   Temp 98.6 F (37 C) (Oral)   Resp 16   Ht 5\' 6"  (1.676 m)   Wt 100.7 kg (222 lb)   LMP 07/20/2017   SpO2 100%   BMI 35.83 kg/m   Physical Exam  Constitutional: She is oriented to person, place, and time. She appears well-developed.  HENT:  Head: Normocephalic.  Eyes: Conjunctivae and EOM are normal. No scleral icterus.  Neck: Neck supple. No thyromegaly present.  Cardiovascular: Normal rate and regular rhythm. Exam reveals no gallop and no friction rub.  No murmur heard. Pulmonary/Chest: No stridor. She has no wheezes. She has no rales. She exhibits no tenderness.  Abdominal: She exhibits no distension. There is tenderness. There is no rebound.  Mild suprapubic tenderness  Musculoskeletal: Normal range of motion. She exhibits no edema.  Lymphadenopathy:    She has no cervical adenopathy.  Neurological: She is oriented to person, place, and time. She exhibits normal muscle tone. Coordination normal.  Skin: No rash noted. No erythema.  Psychiatric: She has a normal mood and affect. Her behavior is normal.     ED Treatments / Results  Labs (all labs ordered are listed, but only abnormal results are displayed) Labs Reviewed  COMPREHENSIVE METABOLIC PANEL - Abnormal; Notable for the following components:      Result Value   Potassium 3.2 (*)    CO2 19 (*)    Glucose, Bld 120 (*)    Total Protein 6.3 (*)    Albumin 3.2 (*)    All other components within normal limits  CBC - Abnormal; Notable for the following components:   WBC 10.6 (*)    All other components within normal limits  URINALYSIS, ROUTINE W REFLEX MICROSCOPIC - Abnormal; Notable for the following components:   APPearance HAZY (*)    Hgb urine dipstick LARGE (*)     Protein, ur 30 (*)    RBC / HPF >50 (*)    Bacteria, UA RARE (*)    All other components within normal limits  URINE CULTURE  LIPASE, BLOOD  I-STAT BETA HCG BLOOD, ED ([facility], [facility], [facility] ONLY)    EKG None  Radiology No results found.  Procedures Procedures (including critical care time)  Medications Ordered in ED Medications  sodium chloride 0.9 % bolus 1,000 mL (1,000 mLs Intravenous New Bag/Given  11/22/17 2205)  potassium chloride SA (K-DUR,KLOR-CON) CR tablet 40 mEq (40 mEq Oral Given 11/22/17 2158)     Initial Impression / Assessment and Plan / ED Course  I have reviewed the triage vital signs and the nursing notes.  Pertinent labs & imaging results that were available during my care of the patient were reviewed by me and considered in my medical decision making (see chart for details).     Patient improved with IV fluids.  Patient states that she has had a urinary tract infection on and off the entire pregnancy.  She is presently taking Keflex 1 pill a day prophylactic.  Patient will have a urine cultured and will increase her Keflex to 3 times a day she will follow-up with her OB/GYN doctor next week.  If she has problems over the weekend she is going to go to [facility]  Final Clinical Impressions(s) / ED Diagnoses   Final diagnoses:  Lower abdominal pain    ED Discharge Orders    None       Milton Ferguson, MD 11/22/17 2308

## 2017-11-22 NOTE — ED Triage Notes (Signed)
Pt reports she is approx 62 wees pregnant, started having lower abd pain worse with movement and also having diarrhea.   Pt reports her Mother recently diagnosed with c-diff and is in the hospital for same.   Pt is concerned for same although denies foul smelling diarrhea.

## 2017-11-25 ENCOUNTER — Telehealth: Payer: Self-pay | Admitting: *Deleted

## 2017-11-25 LAB — URINE CULTURE: CULTURE: NO GROWTH

## 2017-11-25 MED ORDER — PHENAZOPYRIDINE HCL 95 MG PO TABS
95.0000 mg | ORAL_TABLET | Freq: Three times a day (TID) | ORAL | 0 refills | Status: DC | PRN
Start: 2017-11-25 — End: 2018-03-19

## 2017-11-25 NOTE — Telephone Encounter (Signed)
Patient states she went to the ER this weekend for abdominal pain and blood in her urine.  ER told patient to take Keflex 3 times daily until urine culture results.  Not resulted yet. Patient states she still feels like she is peeing fire.  No lower back pain but mostly on her sides towards front is hurting.  No fever.  Please advise.

## 2017-11-25 NOTE — Telephone Encounter (Signed)
Informed patient pyridium was sent to pharmacy and should help with bladder pain.  Informed urine culture was negative and to go back to taking the nightly keflex. Verbalized understanding.

## 2017-12-04 ENCOUNTER — Encounter: Payer: Self-pay | Admitting: Women's Health

## 2017-12-04 ENCOUNTER — Ambulatory Visit (INDEPENDENT_AMBULATORY_CARE_PROVIDER_SITE_OTHER): Payer: Federal, State, Local not specified - PPO

## 2017-12-04 ENCOUNTER — Ambulatory Visit (INDEPENDENT_AMBULATORY_CARE_PROVIDER_SITE_OTHER): Payer: Federal, State, Local not specified - PPO | Admitting: Women's Health

## 2017-12-04 VITALS — BP 110/70 | HR 97 | Wt 230.0 lb

## 2017-12-04 DIAGNOSIS — Z331 Pregnant state, incidental: Secondary | ICD-10-CM

## 2017-12-04 DIAGNOSIS — Z3402 Encounter for supervision of normal first pregnancy, second trimester: Secondary | ICD-10-CM

## 2017-12-04 DIAGNOSIS — O2342 Unspecified infection of urinary tract in pregnancy, second trimester: Secondary | ICD-10-CM

## 2017-12-04 DIAGNOSIS — Z3A18 18 weeks gestation of pregnancy: Secondary | ICD-10-CM

## 2017-12-04 DIAGNOSIS — Z1389 Encounter for screening for other disorder: Secondary | ICD-10-CM

## 2017-12-04 DIAGNOSIS — Z363 Encounter for antenatal screening for malformations: Secondary | ICD-10-CM

## 2017-12-04 LAB — POCT URINALYSIS DIPSTICK
GLUCOSE UA: NEGATIVE
KETONES UA: NEGATIVE
LEUKOCYTES UA: NEGATIVE
Nitrite, UA: NEGATIVE
Protein, UA: NEGATIVE
RBC UA: NEGATIVE

## 2017-12-04 NOTE — Progress Notes (Signed)
   LOW-RISK PREGNANCY VISIT Patient name: Melody Stewart MRN [id number]  Date of birth: 1985/10/04 Chief Complaint:   low risk ob (ultrasound)  History of Present Illness:   Melody Stewart is a 32 y.o. G25P0000 female at [redacted]w[redacted]d with an Estimated Date of Delivery: 05/02/18 being seen today for ongoing management of a low-risk pregnancy.  Today she reports no complaints. Feels much better. Feels like UTI is gone. Went to [facility] 5/3 for cramping, all normal. Was peeing blood for a few days, now resolved. Thinks she may have had a kidney stone  .  Marland Kitchen  Movement: Absent. denies leaking of fluid. Review of Systems:   Pertinent items are noted in HPI Denies abnormal vaginal discharge w/ itching/odor/irritation, headaches, visual changes, shortness of breath, chest pain, abdominal pain, severe nausea/vomiting, or problems with urination or bowel movements unless otherwise stated above. Pertinent History Reviewed:  Reviewed past medical,surgical, social, obstetrical and family history.  Reviewed problem list, medications and allergies. Physical Assessment:   Vitals:   12/04/17 1006  BP: 110/70  Pulse: 97  Weight: 230 lb (104.3 kg)  Body mass index is 37.12 kg/m.        Physical Examination:   General appearance: Well appearing, and in no distress  Mental status: Alert, oriented to person, place, and time  Skin: Warm & dry  Cardiovascular: Normal heart rate noted  Respiratory: Normal respiratory effort, no distress  Abdomen: Soft, gravid, nontender  Pelvic: Cervical exam deferred         Extremities: Edema: None  Fetal Status: Fetal Heart Rate (bpm): +u/s   Movement: Absent    [place] 11+6 wks,cephalic,cx 3.8 cm,anterior pl gr 0,normal ovaries bilat,svp of fluid 3.6 cm,fhr 154 bpm,efw 269 g,anatomy complete,no obvious abnormalities  Results for orders placed or performed in visit on 12/04/17 (from the past 24 hour(s))  POCT urinalysis dipstick   Collection Time: 12/04/17 10:05 AM  Result Value Ref  Range   Color, UA     Clarity, UA     Glucose, UA neg    Bilirubin, UA     Ketones, UA neg    Spec Grav, UA  1.010 - 1.025   Blood, UA neg    pH, UA  5.0 - 8.0   Protein, UA neg    Urobilinogen, UA  0.2 or 1.0 E.U./dL   Nitrite, UA neg    Leukocytes, UA Negative Negative   Appearance     Odor      Assessment & Plan:  1) Low-risk pregnancy G1P0000 at [redacted]w[redacted]d with an Estimated Date of Delivery: 05/02/18   2) Persistent UTI, repeat urine cx today, continue nightly keflex suppression   Meds: No orders of the defined types were placed in this encounter.  Labs/procedures today: anatomy u/s  Plan:  Continue routine obstetrical care   Reviewed: Preterm labor symptoms and general obstetric precautions including but not limited to vaginal bleeding, contractions, leaking of fluid and fetal movement were reviewed in detail with the patient.  All questions were answered  Follow-up: Return in about 1 month (around 01/01/2018) for [place].  Orders Placed This Encounter  Procedures  . Urine Culture  . POCT urinalysis dipstick   Roma Schanz CNM, [facility] 12/04/2017 10:37 AM

## 2017-12-04 NOTE — Progress Notes (Signed)
[place] 78+9 wks,cephalic,cx 3.8 cm,anterior pl gr 0,normal ovaries bilat,svp of fluid 3.6 cm,fhr 154 bpm,efw 269 g,anatomy complete,no obvious abnormalities

## 2017-12-04 NOTE — Patient Instructions (Signed)
Melody Stewart, I greatly value your feedback.  If you receive a survey following your visit with [place] today, we appreciate you taking the time to fill it out.  Thanks, Knute Neu, CNM, WHNP-BC   Second Trimester of Pregnancy The second trimester is from week 14 through week 27 (months 4 through 6). The second trimester is often a time when you feel your best. Your body has adjusted to being pregnant, and you begin to feel better physically. Usually, morning sickness has lessened or quit completely, you may have more energy, and you may have an increase in appetite. The second trimester is also a time when the fetus is growing rapidly. At the end of the sixth month, the fetus is about 9 inches long and weighs about 1 pounds. You will likely begin to feel the baby move (quickening) between 16 and 20 weeks of pregnancy. Body changes during your second trimester Your body continues to go through many changes during your second trimester. The changes vary from woman to woman.  Your weight will continue to increase. You will notice your lower abdomen bulging out.  You may begin to get stretch marks on your hips, abdomen, and breasts.  You may develop headaches that can be relieved by medicines. The medicines should be approved by your health care provider.  You may urinate more often because the fetus is pressing on your bladder.  You may develop or continue to have heartburn as a result of your pregnancy.  You may develop constipation because certain hormones are causing the muscles that push waste through your intestines to slow down.  You may develop hemorrhoids or swollen, bulging veins (varicose veins).  You may have back pain. This is caused by: ? Weight gain. ? Pregnancy hormones that are relaxing the joints in your pelvis. ? A shift in weight and the muscles that support your balance.  Your breasts will continue to grow and they will continue to become tender.  Your gums may bleed  and may be sensitive to brushing and flossing.  Dark spots or blotches (chloasma, mask of pregnancy) may develop on your face. This will likely fade after the baby is born.  A dark line from your belly button to the pubic area (linea nigra) may appear. This will likely fade after the baby is born.  You may have changes in your hair. These can include thickening of your hair, rapid growth, and changes in texture. Some women also have hair loss during or after pregnancy, or hair that feels dry or thin. Your hair will most likely return to normal after your baby is born.  What to expect at prenatal visits During a routine prenatal visit:  You will be weighed to make sure you and the fetus are growing normally.  Your blood pressure will be taken.  Your abdomen will be measured to track your baby's growth.  The fetal heartbeat will be listened to.  Any test results from the previous visit will be discussed.  Your health care provider may ask you:  How you are feeling.  If you are feeling the baby move.  If you have had any abnormal symptoms, such as leaking fluid, bleeding, severe headaches, or abdominal cramping.  If you are using any tobacco products, including cigarettes, chewing tobacco, and electronic cigarettes.  If you have any questions.  Other tests that may be performed during your second trimester include:  Blood tests that check for: ? Low iron levels (anemia). ? High  blood sugar that affects pregnant women (gestational diabetes) between 3 and 28 weeks. ? Rh antibodies. This is to check for a protein on red blood cells (Rh factor).  Urine tests to check for infections, diabetes, or protein in the urine.  An ultrasound to confirm the proper growth and development of the baby.  An amniocentesis to check for possible genetic problems.  Fetal screens for spina bifida and Down syndrome.  HIV (human immunodeficiency virus) testing. Routine prenatal testing includes  screening for HIV, unless you choose not to have this test.  Follow these instructions at home: Medicines  Follow your health care provider's instructions regarding medicine use. Specific medicines may be either safe or unsafe to take during pregnancy.  Take a prenatal vitamin that contains at least 600 micrograms (mcg) of folic acid.  If you develop constipation, try taking a stool softener if your health care provider approves. Eating and drinking  Eat a balanced diet that includes fresh fruits and vegetables, whole grains, good sources of protein such as meat, eggs, or tofu, and low-fat dairy. Your health care provider will help you determine the amount of weight gain that is right for you.  Avoid raw meat and uncooked cheese. These carry germs that can cause birth defects in the baby.  If you have low calcium intake from food, talk to your health care provider about whether you should take a daily calcium supplement.  Limit foods that are high in fat and processed sugars, such as fried and sweet foods.  To prevent constipation: ? Drink enough fluid to keep your urine clear or pale yellow. ? Eat foods that are high in fiber, such as fresh fruits and vegetables, whole grains, and beans. Activity  Exercise only as directed by your health care provider. Most women can continue their usual exercise routine during pregnancy. Try to exercise for 30 minutes at least 5 days a week. Stop exercising if you experience uterine contractions.  Avoid heavy lifting, wear low heel shoes, and practice good posture.  A sexual relationship may be continued unless your health care provider directs you otherwise. Relieving pain and discomfort  Wear a good support bra to prevent discomfort from breast tenderness.  Take warm sitz baths to soothe any pain or discomfort caused by hemorrhoids. Use hemorrhoid cream if your health care provider approves.  Rest with your legs elevated if you have leg cramps  or low back pain.  If you develop varicose veins, wear support hose. Elevate your feet for 15 minutes, 3-4 times a day. Limit salt in your diet. Prenatal Care  Write down your questions. Take them to your prenatal visits.  Keep all your prenatal visits as told by your health care provider. This is important. Safety  Wear your seat belt at all times when driving.  Make a list of emergency phone numbers, including numbers for family, friends, the hospital, and police and fire departments. General instructions  Ask your health care provider for a referral to a local prenatal education class. Begin classes no later than the beginning of month 6 of your pregnancy.  Ask for help if you have counseling or nutritional needs during pregnancy. Your health care provider can offer advice or refer you to specialists for help with various needs.  Do not use hot tubs, steam rooms, or saunas.  Do not douche or use tampons or scented sanitary pads.  Do not cross your legs for long periods of time.  Avoid cat litter boxes and soil  used by cats. These carry germs that can cause birth defects in the baby and possibly loss of the fetus by miscarriage or stillbirth.  Avoid all smoking, herbs, alcohol, and unprescribed drugs. Chemicals in these products can affect the formation and growth of the baby.  Do not use any products that contain nicotine or tobacco, such as cigarettes and e-cigarettes. If you need help quitting, ask your health care provider.  Visit your dentist if you have not gone yet during your pregnancy. Use a soft toothbrush to brush your teeth and be gentle when you floss. Contact a health care provider if:  You have dizziness.  You have mild pelvic cramps, pelvic pressure, or nagging pain in the abdominal area.  You have persistent nausea, vomiting, or diarrhea.  You have a bad smelling vaginal discharge.  You have pain when you urinate. Get help right away if:  You have a  fever.  You are leaking fluid from your vagina.  You have spotting or bleeding from your vagina.  You have severe abdominal cramping or pain.  You have rapid weight gain or weight loss.  You have shortness of breath with chest pain.  You notice sudden or extreme swelling of your face, hands, ankles, feet, or legs.  You have not felt your baby move in over an hour.  You have severe headaches that do not go away when you take medicine.  You have vision changes. Summary  The second trimester is from week 14 through week 27 (months 4 through 6). It is also a time when the fetus is growing rapidly.  Your body goes through many changes during pregnancy. The changes vary from woman to woman.  Avoid all smoking, herbs, alcohol, and unprescribed drugs. These chemicals affect the formation and growth your baby.  Do not use any tobacco products, such as cigarettes, chewing tobacco, and e-cigarettes. If you need help quitting, ask your health care provider.  Contact your health care provider if you have any questions. Keep all prenatal visits as told by your health care provider. This is important. This information is not intended to replace advice given to you by your health care provider. Make sure you discuss any questions you have with your health care provider. Document Released: 07/03/2001 Document Revised: 12/15/2015 Document Reviewed: 09/09/2012 Elsevier Interactive Patient Education  2017 [employer].

## 2017-12-06 LAB — URINE CULTURE

## 2018-01-01 ENCOUNTER — Ambulatory Visit (INDEPENDENT_AMBULATORY_CARE_PROVIDER_SITE_OTHER): Payer: Federal, State, Local not specified - PPO | Admitting: Women's Health

## 2018-01-01 ENCOUNTER — Encounter: Payer: Self-pay | Admitting: Women's Health

## 2018-01-01 VITALS — BP 117/62 | HR 86 | Wt 236.3 lb

## 2018-01-01 DIAGNOSIS — Z3A22 22 weeks gestation of pregnancy: Secondary | ICD-10-CM

## 2018-01-01 DIAGNOSIS — Z3402 Encounter for supervision of normal first pregnancy, second trimester: Secondary | ICD-10-CM

## 2018-01-01 DIAGNOSIS — G5603 Carpal tunnel syndrome, bilateral upper limbs: Secondary | ICD-10-CM

## 2018-01-01 DIAGNOSIS — Z331 Pregnant state, incidental: Secondary | ICD-10-CM

## 2018-01-01 DIAGNOSIS — Z1389 Encounter for screening for other disorder: Secondary | ICD-10-CM

## 2018-01-01 DIAGNOSIS — O99352 Diseases of the nervous system complicating pregnancy, second trimester: Secondary | ICD-10-CM

## 2018-01-01 LAB — POCT URINALYSIS DIPSTICK
Glucose, UA: NEGATIVE
Ketones, UA: NEGATIVE
Leukocytes, UA: NEGATIVE
Nitrite, UA: NEGATIVE
Protein, UA: NEGATIVE

## 2018-01-01 NOTE — Progress Notes (Signed)
   LOW-RISK PREGNANCY VISIT Patient name: Melody Stewart MRN [id number]  Date of birth: [birth year] Chief Complaint:   low risk ob (hands and wrist hurt)  History of Present Illness:   Melody Stewart is a 32 y.o. G39P0000 female at [redacted]w[redacted]d with an Estimated Date of Delivery: 05/02/18 being seen today for ongoing management of a low-risk pregnancy.  Today she reports bilateral hand/wrist pain & numbness that wakes her up in night, wearing wrist splints and has started sleeping w/ pillow b/w hands- which is helping. Had very small dot of bright red blood the other day when wiping, thinks it came from urethra not vagina, none since. Still taking keflex nightly suppression for persistent uti, last cx finally negative.  .  .  Movement: Present. denies leaking of fluid. Review of Systems:   Pertinent items are noted in HPI Denies abnormal vaginal discharge w/ itching/odor/irritation, headaches, visual changes, shortness of breath, chest pain, abdominal pain, severe nausea/vomiting, or problems with urination or bowel movements unless otherwise stated above. Pertinent History Reviewed:  Reviewed past medical,surgical, social, obstetrical and family history.  Reviewed problem list, medications and allergies. Physical Assessment:   Vitals:   01/01/18 0836  BP: 117/62  Pulse: 86  Weight: 236 lb 4.8 oz (107.2 kg)  Body mass index is 38.14 kg/m.        Physical Examination:   General appearance: Well appearing, and in no distress  Mental status: Alert, oriented to person, place, and time  Skin: Warm & dry  Cardiovascular: Normal heart rate noted  Respiratory: Normal respiratory effort, no distress  Abdomen: Soft, gravid, nontender  Pelvic: Cervical exam deferred         Extremities: Edema: Trace  Fetal Status: Fetal Heart Rate (bpm): 155 Fundal Height: 24 cm Movement: Present    Results for orders placed or performed in visit on 01/01/18 (from the past 24 hour(s))  POCT urinalysis dipstick   Collection Time: 01/01/18  8:45 AM  Result Value Ref Range   Color, UA     Clarity, UA     Glucose, UA Negative Negative   Bilirubin, UA     Ketones, UA neg    Spec Grav, UA  1.010 - 1.025   Blood, UA small    pH, UA  5.0 - 8.0   Protein, UA Negative Negative   Urobilinogen, UA  0.2 or 1.0 E.U./dL   Nitrite, UA neg    Leukocytes, UA Negative Negative   Appearance     Odor      Assessment & Plan:  1) Low-risk pregnancy G1P0000 at [redacted]w[redacted]d with an Estimated Date of Delivery: 05/02/18   2) CTS, continue wrist splints, pillow b/w hands, if worsens let us know  3) Persistent UTI> last cx neg, continue nightly keflex suppression   Meds: No orders of the defined types were placed in this encounter.  Labs/procedures today: none  Plan:  Continue routine obstetrical care   Reviewed: Preterm labor symptoms and general obstetric precautions including but not limited to vaginal bleeding, contractions, leaking of fluid and fetal movement were reviewed in detail with the patient.  All questions were answered  Follow-up: Return in about 1 month (around 01/29/2018) for LROB, PN2.  Orders Placed This Encounter  Procedures  . POCT urinalysis dipstick   Roma Schanz CNM, [facility] 01/01/2018 9:01 AM

## 2018-01-01 NOTE — Patient Instructions (Signed)
Melody Stewart, I greatly value your feedback.  If you receive a survey following your visit with [place] today, we appreciate you taking the time to fill it out.  Thanks, Knute Neu, CNM, WHNP-BC   You will have your sugar test next visit.  Please do not eat or drink anything after midnight the night before you come, not even water.  You will be here for at least two hours.     Call the office [phone]) or go to [facility] if:  You begin to have strong, frequent contractions  Your water breaks.  Sometimes it is a big gush of fluid, sometimes it is just a trickle that keeps getting your panties wet or running down your legs  You have vaginal bleeding.  It is normal to have a small amount of spotting if your cervix was checked.   You don't feel your baby moving like normal.  If you don't, get you something to eat and drink and lay down and focus on feeling your baby move.   If your baby is still not moving like normal, you should call the office or go to [place] of Pregnancy The second trimester is from week 13 through week 28, months 4 through 6. The second trimester is often a time when you feel your best. Your body has also adjusted to being pregnant, and you begin to feel better physically. Usually, morning sickness has lessened or quit completely, you may have more energy, and you may have an increase in appetite. The second trimester is also a time when the fetus is growing rapidly. At the end of the sixth month, the fetus is about 9 inches long and weighs about 1 pounds. You will likely begin to feel the baby move (quickening) between 18 and 20 weeks of the pregnancy. BODY CHANGES Your body goes through many changes during pregnancy. The changes vary from woman to woman.   Your weight will continue to increase. You will notice your lower abdomen bulging out.  You may begin to get stretch marks on your hips, abdomen, and breasts.  You may develop  headaches that can be relieved by medicines approved by your health care provider.  You may urinate more often because the fetus is pressing on your bladder.  You may develop or continue to have heartburn as a result of your pregnancy.  You may develop constipation because certain hormones are causing the muscles that push waste through your intestines to slow down.  You may develop hemorrhoids or swollen, bulging veins (varicose veins).  You may have back pain because of the weight gain and pregnancy hormones relaxing your joints between the bones in your pelvis and as a result of a shift in weight and the muscles that support your balance.  Your breasts will continue to grow and be tender.  Your gums may bleed and may be sensitive to brushing and flossing.  Dark spots or blotches (chloasma, mask of pregnancy) may develop on your face. This will likely fade after the baby is born.  A dark line from your belly button to the pubic area (linea nigra) may appear. This will likely fade after the baby is born.  You may have changes in your hair. These can include thickening of your hair, rapid growth, and changes in texture. Some women also have hair loss during or after pregnancy, or hair that feels dry or thin. Your hair will most likely return to normal after your  baby is born. WHAT TO EXPECT AT YOUR PRENATAL VISITS During a routine prenatal visit:  You will be weighed to make sure you and the fetus are growing normally.  Your blood pressure will be taken.  Your abdomen will be measured to track your baby's growth.  The fetal heartbeat will be listened to.  Any test results from the previous visit will be discussed. Your health care provider may ask you:  How you are feeling.  If you are feeling the baby move.  If you have had any abnormal symptoms, such as leaking fluid, bleeding, severe headaches, or abdominal cramping.  If you have any questions. Other tests that may be  performed during your second trimester include:  Blood tests that check for:  Low iron levels (anemia).  Gestational diabetes (between 24 and 28 weeks).  Rh antibodies.  Urine tests to check for infections, diabetes, or protein in the urine.  An ultrasound to confirm the proper growth and development of the baby.  An amniocentesis to check for possible genetic problems.  Fetal screens for spina bifida and Down syndrome. HOME CARE INSTRUCTIONS   Avoid all smoking, herbs, alcohol, and unprescribed drugs. These chemicals affect the formation and growth of the baby.  Follow your health care provider's instructions regarding medicine use. There are medicines that are either safe or unsafe to take during pregnancy.  Exercise only as directed by your health care provider. Experiencing uterine cramps is a good sign to stop exercising.  Continue to eat regular, healthy meals.  Wear a good support bra for breast tenderness.  Do not use hot tubs, steam rooms, or saunas.  Wear your seat belt at all times when driving.  Avoid raw meat, uncooked cheese, cat litter boxes, and soil used by cats. These carry germs that can cause birth defects in the baby.  Take your prenatal vitamins.  Try taking a stool softener (if your health care provider approves) if you develop constipation. Eat more high-fiber foods, such as fresh vegetables or fruit and whole grains. Drink plenty of fluids to keep your urine clear or pale yellow.  Take warm sitz baths to soothe any pain or discomfort caused by hemorrhoids. Use hemorrhoid cream if your health care provider approves.  If you develop varicose veins, wear support hose. Elevate your feet for 15 minutes, 3-4 times a day. Limit salt in your diet.  Avoid heavy lifting, wear low heel shoes, and practice good posture.  Rest with your legs elevated if you have leg cramps or low back pain.  Visit your dentist if you have not gone yet during your pregnancy.  Use a soft toothbrush to brush your teeth and be gentle when you floss.  A sexual relationship may be continued unless your health care provider directs you otherwise.  Continue to go to all your prenatal visits as directed by your health care provider. SEEK MEDICAL CARE IF:   You have dizziness.  You have mild pelvic cramps, pelvic pressure, or nagging pain in the abdominal area.  You have persistent nausea, vomiting, or diarrhea.  You have a bad smelling vaginal discharge.  You have pain with urination. SEEK IMMEDIATE MEDICAL CARE IF:   You have a fever.  You are leaking fluid from your vagina.  You have spotting or bleeding from your vagina.  You have severe abdominal cramping or pain.  You have rapid weight gain or loss.  You have shortness of breath with chest pain.  You notice sudden or extreme   swelling of your face, hands, ankles, feet, or legs.  You have not felt your baby move in over an hour.  You have severe headaches that do not go away with medicine.  You have vision changes. Document Released: 07/03/2001 Document Revised: 07/14/2013 Document Reviewed: 09/09/2012 [facility] Patient Information 2015 [place], [place]. This information is not intended to replace advice given to you by your health care provider. Make sure you discuss any questions you have with your health care provider.

## 2018-01-27 ENCOUNTER — Inpatient Hospital Stay (HOSPITAL_COMMUNITY)
Admission: AD | Admit: 2018-01-27 | Discharge: 2018-01-27 | Disposition: A | Discharge status: Home / Self Care | Payer: Federal, State, Local not specified - PPO | Source: Ambulatory Visit | Attending: Obstetrics and Gynecology | Admitting: Obstetrics and Gynecology

## 2018-01-27 ENCOUNTER — Inpatient Hospital Stay (HOSPITAL_COMMUNITY): Payer: Federal, State, Local not specified - PPO

## 2018-01-27 ENCOUNTER — Other Ambulatory Visit: Payer: Self-pay

## 2018-01-27 ENCOUNTER — Encounter (HOSPITAL_COMMUNITY): Payer: Self-pay | Admitting: *Deleted

## 2018-01-27 DIAGNOSIS — N200 Calculus of kidney: Secondary | ICD-10-CM | POA: Diagnosis not present

## 2018-01-27 DIAGNOSIS — R1031 Right lower quadrant pain: Secondary | ICD-10-CM | POA: Insufficient documentation

## 2018-01-27 DIAGNOSIS — O26832 Pregnancy related renal disease, second trimester: Secondary | ICD-10-CM

## 2018-01-27 DIAGNOSIS — Z3A25 25 weeks gestation of pregnancy: Secondary | ICD-10-CM | POA: Diagnosis not present

## 2018-01-27 DIAGNOSIS — M545 Low back pain, unspecified: Secondary | ICD-10-CM

## 2018-01-27 DIAGNOSIS — Z87891 Personal history of nicotine dependence: Secondary | ICD-10-CM | POA: Insufficient documentation

## 2018-01-27 DIAGNOSIS — N132 Hydronephrosis with renal and ureteral calculous obstruction: Secondary | ICD-10-CM | POA: Diagnosis not present

## 2018-01-27 HISTORY — DX: Urinary tract infection, site not specified: N39.0

## 2018-01-27 LAB — URINALYSIS, ROUTINE W REFLEX MICROSCOPIC
BILIRUBIN URINE: NEGATIVE
Glucose, UA: NEGATIVE mg/dL
KETONES UR: NEGATIVE mg/dL
Leukocytes, UA: NEGATIVE
Nitrite: NEGATIVE
PH: 7 (ref 5.0–8.0)
Protein, ur: NEGATIVE mg/dL
Specific Gravity, Urine: 1.018 (ref 1.005–1.030)

## 2018-01-27 MED ORDER — PROMETHAZINE HCL 25 MG RE SUPP: 25.0000 mg | Freq: Four times a day (QID) | RECTAL | 0 refills | Status: DC | PRN

## 2018-01-27 MED ORDER — PROMETHAZINE HCL 25 MG/ML IJ SOLN
25.0000 mg | Freq: Once | INTRAMUSCULAR | Status: AC
  Administered 2018-01-27: 25 mg via INTRAVENOUS
  Filled 2018-01-27: qty 1

## 2018-01-27 MED ORDER — HYDROMORPHONE HCL 1 MG/ML IJ SOLN
2.0000 mg | Freq: Once | INTRAMUSCULAR | Status: AC
Start: 2018-01-27 — End: 2018-01-27
  Administered 2018-01-27: 2 mg via INTRAVENOUS
  Filled 2018-01-27: qty 2

## 2018-01-27 MED ORDER — ONDANSETRON HCL 4 MG/2ML IJ SOLN
4.0000 mg | Freq: Once | INTRAMUSCULAR | Status: AC
  Administered 2018-01-27: 4 mg via INTRAMUSCULAR
  Filled 2018-01-27: qty 2

## 2018-01-27 MED ORDER — KETOROLAC TROMETHAMINE 60 MG/2ML IM SOLN
60.0000 mg | INTRAMUSCULAR | Status: AC
  Administered 2018-01-27: 60 mg via INTRAMUSCULAR
  Filled 2018-01-27: qty 2

## 2018-01-27 MED ORDER — TAMSULOSIN HCL 0.4 MG PO CAPS: 0.4000 mg | ORAL_CAPSULE | Freq: Every day | ORAL | 0 refills | Status: DC

## 2018-01-27 MED ORDER — LACTATED RINGERS IV BOLUS
1000.0000 mL | Freq: Once | INTRAVENOUS | Status: AC
  Administered 2018-01-27: 1000 mL via INTRAVENOUS

## 2018-01-27 MED ORDER — HYDROMORPHONE HCL 2 MG PO TABS: 1.0000 mg | ORAL_TABLET | Freq: Four times a day (QID) | ORAL | 0 refills | Status: DC | PRN

## 2018-01-27 NOTE — MAU Provider Note (Signed)
History     CSN: [id number]  Arrival date and time: 01/27/18 1137   First Provider Initiated Contact with Patient 01/27/18 1241      Chief Complaint  Patient presents with  . Back Pain   HPI  Ms. Melody Stewart is a 32 year old G1P0 [redacted]w[redacted]d who presents with a chief complaint of lower right back and lower right abdominal pain. She states the pain started around 9:30 this morning and describes it as both sharp and aching. She has not tried anything to help the pain go away. She describes the pain as a 10/10 and that she has not felt pain like this before.   OB History    Gravida  1   Para  0   Term  0   Preterm  0   AB  0   Living  0     SAB  0   TAB  0   Ectopic  0   Multiple  0   Live Births              Past Medical History:  Diagnosis Date  . Anxiety and depression 09/14/2015  . Asthma   . BV (bacterial vaginosis) 02/24/2016  . Colitis   . Contraceptive management 05/18/2015  . Fibroadenoma of right breast   . GERD (gastroesophageal reflux disease)   . IBS (irritable bowel syndrome)   . Screening for STD (sexually transmitted disease) 09/14/2015  . UTI (urinary tract infection)   . Vaginal itching 09/14/2015    Past Surgical History:  Procedure Laterality Date  . KNEE SURGERY    . TONSILLECTOMY    . WISDOM TOOTH EXTRACTION      Family History  Problem Relation Age of Onset  . Hypertension Mother   . Other Mother        mother had child hydrocephic-died  . Hypertension Father   . Heart disease Paternal Grandfather   . Stroke Paternal Grandfather   . Thyroid disease Maternal Grandmother   . Heart disease Maternal Grandmother   . Cancer Paternal Grandmother        breast  . Thyroid disease Maternal Aunt   . Cancer Paternal Aunt        breast    Social History   Tobacco Use  . Smoking status: Former Smoker    Types: Cigarettes  . Smokeless tobacco: Never Used  Substance Use Topics  . Alcohol use: No    Alcohol/week: 0.6 oz    Types:  1 Standard drinks or equivalent per week    Frequency: Never    Comment: occ; not now  . Drug use: No    Allergies:  Allergies  Allergen Reactions  . Hydrocodone     Make her sick and constipated.    Medications Prior to Admission  Medication Sig Dispense Refill Last Dose  . CALCIUM PO Take 1 tablet by mouth daily.    Taking  . cephALEXin (KEFLEX) 500 MG capsule Take 1 capsule (500 mg total) by mouth at bedtime. 30 capsule 6 Taking  . Docosahexaenoic Acid (DHA PO) Take 1 tablet by mouth daily.    Not Taking  . phenazopyridine (PYRIDIUM) 95 MG tablet Take 1 tablet (95 mg total) by mouth 3 (three) times daily as needed for pain. (Patient not taking: Reported on 12/04/2017) 10 tablet 0 Not Taking  . Prenatal Vit-Fe Fumarate-FA (PRENATAL VITAMIN PO) Take 1 tablet by mouth daily.    Taking  . Probiotic Product (PROBIOTIC DAILY PO)  Take 1 tablet by mouth daily.   Taking  . VENTOLIN HFA 108 (90 Base) MCG/ACT inhaler Inhale 2 puffs into the lungs every 6 (six) hours as needed for wheezing.    Taking    Review of Systems  Constitutional: Negative.   Eyes: Negative for visual disturbance.  Respiratory: Negative for shortness of breath.   Cardiovascular: Negative for chest pain and palpitations.       Mild swelling in hands in the morning  Gastrointestinal: Positive for abdominal pain and nausea. Negative for constipation and vomiting.  Genitourinary: Positive for hematuria. Negative for vaginal bleeding, vaginal discharge and vaginal pain.   Physical Exam   Blood pressure 122/63, pulse 68, temperature (!) 97.4 F (36.3 C), temperature source Oral, resp. rate (!) 22, weight 107.7 kg (237 lb 8 oz), last menstrual period 07/20/2017, SpO2 98 %.  Physical Exam  Constitutional: She appears well-developed. She appears distressed.  HENT:  Head: Normocephalic and atraumatic.  Cardiovascular: Normal rate and regular rhythm. Exam reveals no gallop and no friction rub.  No murmur  heard. Respiratory: Breath sounds normal.  GI: Soft. There is tenderness.  Skin: Skin is warm and dry.   US Renal  Result Date: 01/27/2018 CLINICAL DATA:  Low back pain radiating to right side, [redacted] weeks pregnant, persistent UTI EXAM: RENAL / URINARY TRACT ULTRASOUND COMPLETE COMPARISON:  None. FINDINGS: Right Kidney: Length: 13.2 cm.  Moderate right hydronephrosis. Left Kidney: Length: 12.5 cm.  8 mm nonobstructing calculus.  No hydronephrosis. Bladder: Within normal limits.  Right bladder jet not visualized. IMPRESSION: Moderate right hydronephrosis. In the setting of pregnancy, this may be physiologic related to extrinsic compression. However, this remains technically indeterminate. 8 mm nonobstructing left lower pole renal calculus. No hydronephrosis. Electronically Signed   By: Julian Hy M.D.   On: 01/27/2018 14:16   Results for orders placed or performed during the hospital encounter of 01/27/18 (from the past 24 hour(s))  Urinalysis, Routine w reflex microscopic     Status: Abnormal   Collection Time: 01/27/18 12:42 PM  Result Value Ref Range   Color, Urine YELLOW YELLOW   APPearance HAZY (A) CLEAR   Specific Gravity, Urine 1.018 1.005 - 1.030   pH 7.0 5.0 - 8.0   Glucose, UA NEGATIVE NEGATIVE mg/dL   Hgb urine dipstick SMALL (A) NEGATIVE   Bilirubin Urine NEGATIVE NEGATIVE   Ketones, ur NEGATIVE NEGATIVE mg/dL   Protein, ur NEGATIVE NEGATIVE mg/dL   Nitrite NEGATIVE NEGATIVE   Leukocytes, UA NEGATIVE NEGATIVE   RBC / HPF 21-50 0 - 5 RBC/hpf   WBC, UA 0-5 0 - 5 WBC/hpf   Bacteria, UA RARE (A) NONE SEEN   Squamous Epithelial / LPF 0-5 0 - 5   Mucus PRESENT    Ca Oxalate Crys, UA PRESENT     MAU Course  MDM Dilaudid Phenergen Lactated Ringers US Renal UA   Assessment and Plan  Kidney Stone Complicating Pregnancy in Second Trimester  Discharge to home Start Flomax 1 capsule daily  Continue Dilaudid as needed for pain Continue Phenergan for nausea Pt advised  to return to MAU if signs or symptoms worsen   Sherie Don 01/27/2018, 1:00 PM

## 2018-01-27 NOTE — Progress Notes (Signed)
[facility] called to clarify why patient was prescribed Dilaudid (as per new CDC guidelines).  Per dc instructions, pt sent home with Dilaudid for pain r/t kidney stones.  Pharmacist will dispense medication.

## 2018-01-27 NOTE — MAU Provider Note (Signed)
History     CSN: [id number]  Arrival date and time: 01/27/18 1137   First Provider Initiated Contact with Patient 01/27/18 1241      Chief Complaint  Patient presents with  . Back Pain   HPI  Ms.  Melody Stewart is a 32 y.o. year old G51P0000 female at [redacted]w[redacted]d weeks gestation who presents to MAU reporting dull to sharp RT lower back pain that radiates to lower pelvic area and nausea from the pain. She "has never had this kind of pain" in this pregnancy. She has a h/o UTI & kidney stones. She is on nightly dose of Keflex for UTI suppression. She states that this pain is worse than when she had kidney stones before. She also has N/V from the increasing pain. She reports good (+) FM.   Past Medical History:  Diagnosis Date  . Anxiety and depression 09/14/2015  . Asthma   . BV (bacterial vaginosis) 02/24/2016  . Colitis   . Contraceptive management 05/18/2015  . Fibroadenoma of right breast   . GERD (gastroesophageal reflux disease)   . IBS (irritable bowel syndrome)   . Screening for STD (sexually transmitted disease) 09/14/2015  . UTI (urinary tract infection)   . Vaginal itching 09/14/2015    Past Surgical History:  Procedure Laterality Date  . KNEE SURGERY    . TONSILLECTOMY    . WISDOM TOOTH EXTRACTION      Family History  Problem Relation Age of Onset  . Hypertension Mother   . Other Mother        mother had child hydrocephic-died  . Hypertension Father   . Heart disease Paternal Grandfather   . Stroke Paternal Grandfather   . Thyroid disease Maternal Grandmother   . Heart disease Maternal Grandmother   . Cancer Paternal Grandmother        breast  . Thyroid disease Maternal Aunt   . Cancer Paternal Aunt        breast    Social History   Tobacco Use  . Smoking status: Former Smoker    Types: Cigarettes  . Smokeless tobacco: Never Used  Substance Use Topics  . Alcohol use: No    Alcohol/week: 0.6 oz    Types: 1 Standard drinks or equivalent per week   Frequency: Never    Comment: occ; not now  . Drug use: No    Allergies:  Allergies  Allergen Reactions  . Hydrocodone     Make her sick and constipated.    Medications Prior to Admission  Medication Sig Dispense Refill Last Dose  . CALCIUM PO Take 1 tablet by mouth daily.    Taking  . cephALEXin (KEFLEX) 500 MG capsule Take 1 capsule (500 mg total) by mouth at bedtime. 30 capsule 6 Taking  . Docosahexaenoic Acid (DHA PO) Take 1 tablet by mouth daily.    Not Taking  . phenazopyridine (PYRIDIUM) 95 MG tablet Take 1 tablet (95 mg total) by mouth 3 (three) times daily as needed for pain. (Patient not taking: Reported on 12/04/2017) 10 tablet 0 Not Taking  . Prenatal Vit-Fe Fumarate-FA (PRENATAL VITAMIN PO) Take 1 tablet by mouth daily.    Taking  . Probiotic Product (PROBIOTIC DAILY PO) Take 1 tablet by mouth daily.   Taking  . VENTOLIN HFA 108 (90 Base) MCG/ACT inhaler Inhale 2 puffs into the lungs every 6 (six) hours as needed for wheezing.    Taking    Review of Systems  Constitutional: Negative.   HENT:  Negative.   Eyes: Negative.   Respiratory: Negative.   Cardiovascular: Negative.   Gastrointestinal: Positive for nausea and vomiting.  Endocrine: Negative.   Genitourinary: Negative.   Musculoskeletal: Positive for back pain (radiates to lower RT pelvis).  Skin: Negative.   Allergic/Immunologic: Negative.   Neurological: Negative.   Hematological: Negative.   Psychiatric/Behavioral: Negative.    Physical Exam   Blood pressure 122/63, pulse 68, temperature (!) 97.4 F (36.3 C), temperature source Oral, resp. rate (!) 22, weight 237 lb 8 oz (107.7 kg), last menstrual period 07/20/2017, SpO2 98 %.  Physical Exam  Nursing note and vitals reviewed. Constitutional: She is oriented to person, place, and time. She appears well-developed and well-nourished.  HENT:  Head: Normocephalic and atraumatic.  Eyes: Pupils are equal, round, and reactive to light.  Neck: Normal range of  motion.  Cardiovascular: Normal rate, regular rhythm and normal heart sounds.  Respiratory: Effort normal and breath sounds normal.  GI: Soft. Bowel sounds are normal. There is CVA tenderness.  Genitourinary:  Genitourinary Comments: Pelvic deferred  Musculoskeletal: Normal range of motion.  Neurological: She is alert and oriented to person, place, and time.  Skin: Skin is warm and dry.  Psychiatric: She has a normal mood and affect. Her behavior is normal. Judgment and thought content normal.    MAU Course  Procedures  MDM CCUA LR 1000 ml bolus Dilaudid 2 mg IVP -- improved pain Phenergan 25 mg IVP -- minimally improved N/V Zofran 4 mg IVP Toradol 60 mg IM injection -- improved pain Renal U/S NST - FHR: 140 bpm / moderate variability / accels present / decels absent / TOCO: none   *Consult with Dr. Rip Harbour @ 1550 - notified of patient's complaints, assessments, lab & U/S results, tx plan Rx Flomax and pain medication - ok to d/c home with Rx for Flomax and Dilaudid 1 mg every 6 hrs; give Toradol 60 mg IM injection prior to d/c home  Results for orders placed or performed during the hospital encounter of 01/27/18 (from the past 24 hour(s))  Urinalysis, Routine w reflex microscopic     Status: Abnormal   Collection Time: 01/27/18 12:42 PM  Result Value Ref Range   Color, Urine YELLOW YELLOW   APPearance HAZY (A) CLEAR   Specific Gravity, Urine 1.018 1.005 - 1.030   pH 7.0 5.0 - 8.0   Glucose, UA NEGATIVE NEGATIVE mg/dL   Hgb urine dipstick SMALL (A) NEGATIVE   Bilirubin Urine NEGATIVE NEGATIVE   Ketones, ur NEGATIVE NEGATIVE mg/dL   Protein, ur NEGATIVE NEGATIVE mg/dL   Nitrite NEGATIVE NEGATIVE   Leukocytes, UA NEGATIVE NEGATIVE   RBC / HPF 21-50 0 - 5 RBC/hpf   WBC, UA 0-5 0 - 5 WBC/hpf   Bacteria, UA RARE (A) NONE SEEN   Squamous Epithelial / LPF 0-5 0 - 5   Mucus PRESENT    Ca Oxalate Crys, UA PRESENT     US Renal  Result Date: 01/27/2018 CLINICAL DATA:  Low back  pain radiating to right side, [redacted] weeks pregnant, persistent UTI EXAM: RENAL / URINARY TRACT ULTRASOUND COMPLETE COMPARISON:  None. FINDINGS: Right Kidney: Length: 13.2 cm.  Moderate right hydronephrosis. Left Kidney: Length: 12.5 cm.  8 mm nonobstructing calculus.  No hydronephrosis. Bladder: Within normal limits.  Right bladder jet not visualized. IMPRESSION: Moderate right hydronephrosis. In the setting of pregnancy, this may be physiologic related to extrinsic compression. However, this remains technically indeterminate. 8 mm nonobstructing left lower pole renal calculus. No  hydronephrosis. Electronically Signed   By: Julian Hy M.D.   On: 01/27/2018 14:16    Assessment and Plan  Kidney stone complicating pregnancy, second trimester - Rx for Flomax 0.4 mg po daily after breakfast - Rx for Phenergan 25 mg rectally every 6 hrs  - Rx for Dilaudid 1 mg po every 6 hrs prn severe pain - F/U with [facility] about referral to Urologist - Information provided on kidney stones  - Discharge home - Keep scheduled appt with [facility] - Patient verbalized an understanding of the plan of care and agrees.    Laury Deep, MSN, CNM 01/27/2018, 12:50 PM

## 2018-01-27 NOTE — MAU Note (Signed)
Having really bad back pain, radiating to rt side. Started 2 hrs ago, constant- getting worse.  Hurts so bad she could puke. Spec of blood in pee this morning.

## 2018-01-29 ENCOUNTER — Ambulatory Visit (INDEPENDENT_AMBULATORY_CARE_PROVIDER_SITE_OTHER): Payer: Federal, State, Local not specified - PPO | Admitting: Advanced Practice Midwife

## 2018-01-29 ENCOUNTER — Other Ambulatory Visit: Payer: Federal, State, Local not specified - PPO

## 2018-01-29 VITALS — BP 129/76 | HR 92 | Wt 244.0 lb

## 2018-01-29 DIAGNOSIS — Z3A26 26 weeks gestation of pregnancy: Secondary | ICD-10-CM | POA: Diagnosis not present

## 2018-01-29 DIAGNOSIS — Z131 Encounter for screening for diabetes mellitus: Secondary | ICD-10-CM

## 2018-01-29 DIAGNOSIS — Z3402 Encounter for supervision of normal first pregnancy, second trimester: Secondary | ICD-10-CM | POA: Diagnosis not present

## 2018-01-29 DIAGNOSIS — Z1389 Encounter for screening for other disorder: Secondary | ICD-10-CM

## 2018-01-29 DIAGNOSIS — Z331 Pregnant state, incidental: Secondary | ICD-10-CM

## 2018-01-29 LAB — POCT URINALYSIS DIPSTICK
Glucose, UA: NEGATIVE
KETONES UA: NEGATIVE
LEUKOCYTES UA: NEGATIVE
NITRITE UA: NEGATIVE
PROTEIN UA: NEGATIVE

## 2018-01-29 NOTE — Patient Instructions (Signed)
Melody Stewart, I greatly value your feedback.  If you receive a survey following your visit with [place] today, we appreciate you taking the time to fill it out.  Thanks, Melody Stewart, CNM   Call the office [phone]) or go to [facility] if:  You begin to have strong, frequent contractions  Your water breaks.  Sometimes it is a big gush of fluid, sometimes it is just a trickle that keeps getting your panties wet or running down your legs  You have vaginal bleeding.  It is normal to have a small amount of spotting if your cervix was checked.   You don't feel your baby moving like normal.  If you don't, get you something to eat and drink and lay down and focus on feeling your baby move.  You should feel at least 10 movements in 2 hours.  If you don't, you should call the office or go to [facility].    Tdap Vaccine  It is recommended that you get the Tdap vaccine during the third trimester of EACH pregnancy to help protect your baby from getting pertussis (whooping cough)  27-36 weeks is the BEST time to do this so that you can pass the protection on to your baby. During pregnancy is better than after pregnancy, but if you are unable to get it during pregnancy it will be offered at the hospital.   You will be offered this vaccine in the office after 27 weeks. If you do not have health insurance, you can get this vaccine at the health department or your family doctor  Everyone who will be around your baby should also be up-to-date on their vaccines. Adults (who are not pregnant) only need 1 dose of Tdap during adulthood.   Third Trimester of Pregnancy The third trimester is from week 29 through week 42, months 7 through 9. The third trimester is a time when the fetus is growing rapidly. At the end of the ninth month, the fetus is about 20 inches in length and weighs 6-10 pounds.  BODY CHANGES Your body goes through many changes during pregnancy. The changes vary from woman to  woman.   Your weight will continue to increase. You can expect to gain 25-35 pounds (11-16 kg) by the end of the pregnancy.  You may begin to get stretch marks on your hips, abdomen, and breasts.  You may urinate more often because the fetus is moving lower into your pelvis and pressing on your bladder.  You may develop or continue to have heartburn as a result of your pregnancy.  You may develop constipation because certain hormones are causing the muscles that push waste through your intestines to slow down.  You may develop hemorrhoids or swollen, bulging veins (varicose veins).  You may have pelvic pain because of the weight gain and pregnancy hormones relaxing your joints between the bones in your pelvis. Backaches may result from overexertion of the muscles supporting your posture.  You may have changes in your hair. These can include thickening of your hair, rapid growth, and changes in texture. Some women also have hair loss during or after pregnancy, or hair that feels dry or thin. Your hair will most likely return to normal after your baby is born.  Your breasts will continue to grow and be tender. A yellow discharge may leak from your breasts called colostrum.  Your belly button may stick out.  You may feel short of breath because of your expanding uterus.  You may notice the fetus "dropping," or moving lower in your abdomen.  You may have a bloody mucus discharge. This usually occurs a few days to a week before labor begins.  Your cervix becomes thin and soft (effaced) near your due date. WHAT TO EXPECT AT YOUR PRENATAL EXAMS  You will have prenatal exams every 2 weeks until week 36. Then, you will have weekly prenatal exams. During a routine prenatal visit:  You will be weighed to make sure you and the fetus are growing normally.  Your blood pressure is taken.  Your abdomen will be measured to track your baby's growth.  The fetal heartbeat will be listened  to.  Any test results from the previous visit will be discussed.  You may have a cervical check near your due date to see if you have effaced. At around 36 weeks, your caregiver will check your cervix. At the same time, your caregiver will also perform a test on the secretions of the vaginal tissue. This test is to determine if a type of bacteria, Group B streptococcus, is present. Your caregiver will explain this further. Your caregiver may ask you:  What your birth plan is.  How you are feeling.  If you are feeling the baby move.  If you have had any abnormal symptoms, such as leaking fluid, bleeding, severe headaches, or abdominal cramping.  If you have any questions. Other tests or screenings that may be performed during your third trimester include:  Blood tests that check for low iron levels (anemia).  Fetal testing to check the health, activity level, and growth of the fetus. Testing is done if you have certain medical conditions or if there are problems during the pregnancy. FALSE LABOR You may feel small, irregular contractions that eventually go away. These are called Braxton Hicks contractions, or false labor. Contractions may last for hours, days, or even weeks before true labor sets in. If contractions come at regular intervals, intensify, or become painful, it is best to be seen by your caregiver.  SIGNS OF LABOR   Menstrual-like cramps.  Contractions that are 5 minutes apart or less.  Contractions that start on the top of the uterus and spread down to the lower abdomen and back.  A sense of increased pelvic pressure or back pain.  A watery or bloody mucus discharge that comes from the vagina. If you have any of these signs before the 37th week of pregnancy, call your caregiver right away. You need to go to the hospital to get checked immediately. HOME CARE INSTRUCTIONS   Avoid all smoking, herbs, alcohol, and unprescribed drugs. These chemicals affect the  formation and growth of the baby.  Follow your caregiver's instructions regarding medicine use. There are medicines that are either safe or unsafe to take during pregnancy.  Exercise only as directed by your caregiver. Experiencing uterine cramps is a good sign to stop exercising.  Continue to eat regular, healthy meals.  Wear a good support bra for breast tenderness.  Do not use hot tubs, steam rooms, or saunas.  Wear your seat belt at all times when driving.  Avoid raw meat, uncooked cheese, cat litter boxes, and soil used by cats. These carry germs that can cause birth defects in the baby.  Take your prenatal vitamins.  Try taking a stool softener (if your caregiver approves) if you develop constipation. Eat more high-fiber foods, such as fresh vegetables or fruit and whole grains. Drink plenty of fluids to keep your urine  clear or pale yellow.  Take warm sitz baths to soothe any pain or discomfort caused by hemorrhoids. Use hemorrhoid cream if your caregiver approves.  If you develop varicose veins, wear support hose. Elevate your feet for 15 minutes, 3-4 times a day. Limit salt in your diet.  Avoid heavy lifting, wear low heal shoes, and practice good posture.  Rest a lot with your legs elevated if you have leg cramps or low back pain.  Visit your dentist if you have not gone during your pregnancy. Use a soft toothbrush to brush your teeth and be gentle when you floss.  A sexual relationship may be continued unless your caregiver directs you otherwise.  Do not travel far distances unless it is absolutely necessary and only with the approval of your caregiver.  Take prenatal classes to understand, practice, and ask questions about the labor and delivery.  Make a trial run to the hospital.  Pack your hospital bag.  Prepare the baby's nursery.  Continue to go to all your prenatal visits as directed by your caregiver. SEEK MEDICAL CARE IF:  You are unsure if you are in  labor or if your water has broken.  You have dizziness.  You have mild pelvic cramps, pelvic pressure, or nagging pain in your abdominal area.  You have persistent nausea, vomiting, or diarrhea.  You have a bad smelling vaginal discharge.  You have pain with urination. SEEK IMMEDIATE MEDICAL CARE IF:   You have a fever.  You are leaking fluid from your vagina.  You have spotting or bleeding from your vagina.  You have severe abdominal cramping or pain.  You have rapid weight loss or gain.  You have shortness of breath with chest pain.  You notice sudden or extreme swelling of your face, hands, ankles, feet, or legs.  You have not felt your baby move in over an hour.  You have severe headaches that do not go away with medicine.  You have vision changes. Document Released: 07/03/2001 Document Revised: 07/14/2013 Document Reviewed: 09/09/2012 [facility] Patient Information 2015 [place], [place]. This information is not intended to replace advice given to you by your health care provider. Make sure you discuss any questions you have with your health care provider.

## 2018-01-29 NOTE — Progress Notes (Signed)
  G1P0000 [redacted]w[redacted]d Estimated Date of Delivery: 05/02/18  Blood pressure 129/76, pulse 92, weight 244 lb (110.7 kg), last menstrual period 07/20/2017.   BP weight and urine results all reviewed and noted.  Please refer to the obstetrical flow sheet for the fundal height and fetal heart rate documentation:  Patient reports good fetal movement, denies any bleeding and no rupture of membranes symptoms or regular contractions. Patient is without complaints. All questions were answered.   Physical Assessment:   Vitals:   01/29/18 0926  BP: 129/76  Pulse: 92  Weight: 244 lb (110.7 kg)  Body mass index is 39.38 kg/m.        Physical Examination:   General appearance: Well appearing, and in no distress  Mental status: Alert, oriented to person, place, and time  Skin: Warm & dry  Cardiovascular: Normal heart rate noted  Respiratory: Normal respiratory effort, no distress  Abdomen: Soft, gravid, nontender  Pelvic: Cervical exam deferred         Extremities: Edema: Trace  Fetal Status:     Movement: Present    Results for orders placed or performed in visit on 01/29/18 (from the past 24 hour(s))  POCT Urinalysis Dipstick   Collection Time: 01/29/18  9:32 AM  Result Value Ref Range   Color, UA     Clarity, UA     Glucose, UA Negative Negative   Bilirubin, UA     Ketones, UA neg    Spec Grav, UA  1.010 - 1.025   Blood, UA large    pH, UA  5.0 - 8.0   Protein, UA Negative Negative   Urobilinogen, UA  0.2 or 1.0 E.U./dL   Nitrite, UA negative    Leukocytes, UA Negative Negative   Appearance     Odor       Orders Placed This Encounter  Procedures  . POCT Urinalysis Dipstick   Went to MAU 7/8 w/ right LB/flank pain.  Renal US:  Moderate right hydronephrosis. In the setting of pregnancy, this may be physiologic related to extrinsic compression. However, this remains technically indeterminate.  Starated flomax, dilaudid PO prn. Feels better, still hasn't passed  8 mm  nonobstructing left lower pole renal calculus. No hydronephrosis. Plan:  Continued routine obstetrical care, PN2 today  Return in about 3 weeks (around 02/19/2018) for LROB.

## 2018-01-30 ENCOUNTER — Telehealth: Payer: Self-pay | Admitting: Advanced Practice Midwife

## 2018-01-30 LAB — GLUCOSE TOLERANCE, 2 HOURS W/ 1HR
GLUCOSE, FASTING: 89 mg/dL (ref 65–91)
Glucose, 1 hour: 129 mg/dL (ref 65–179)
Glucose, 2 hour: 116 mg/dL (ref 65–152)

## 2018-01-30 LAB — CBC
Hematocrit: 37 % (ref 34.0–46.6)
Hemoglobin: 11.9 g/dL (ref 11.1–15.9)
MCH: 28.9 pg (ref 26.6–33.0)
MCHC: 32.2 g/dL (ref 31.5–35.7)
MCV: 90 fL (ref 79–97)
PLATELETS: 333 10*3/uL (ref 150–450)
RBC: 4.12 x10E6/uL (ref 3.77–5.28)
RDW: 13.8 % (ref 12.3–15.4)
WBC: 14.4 10*3/uL — ABNORMAL HIGH (ref 3.4–10.8)

## 2018-01-30 LAB — HIV ANTIBODY (ROUTINE TESTING W REFLEX): HIV Screen 4th Generation wRfx: NONREACTIVE

## 2018-01-30 LAB — ANTIBODY SCREEN: Antibody Screen: NEGATIVE

## 2018-01-30 LAB — RPR: RPR Ser Ql: NONREACTIVE

## 2018-01-30 NOTE — Telephone Encounter (Signed)
Patient states she was seen yesterday by Manus Gunning and was fine and was told she could go back to work.  Says her back has started hurting again and did not go to work.  Discussed with Manus Gunning and stated note was fine to send. Will send note via mychart.

## 2018-01-30 NOTE — Telephone Encounter (Signed)
Patient called stating that she was given a letter stating that she was able to stay out of work for a couple of days due to her back, pt states that she was not able to go into work today do to her back and would like her letter extended. Please contact pt

## 2018-02-03 ENCOUNTER — Telehealth: Payer: Self-pay | Admitting: Women's Health

## 2018-02-03 NOTE — Telephone Encounter (Signed)
Pt still having some problems with kidney stone. She thinks that the Flomax is making her feel worse. She gets dizzy and has diarrhea since taking it. She didn't take it today and does feel a little better. She's unsure if its just the kidney stone making her feel bad or if its the medicine.

## 2018-02-03 NOTE — Telephone Encounter (Signed)
In al ot of pain from Kidney stone needs to talk to someone/

## 2018-02-04 ENCOUNTER — Telehealth: Payer: Self-pay | Admitting: Women's Health

## 2018-02-04 NOTE — Telephone Encounter (Signed)
Patient called stating that she called yesterday regarding a problem she did say she spoke with a nurse and they were going to speak with a provider and give her a call back. Pt states she did not receive a phone call, please contact pt

## 2018-02-04 NOTE — Telephone Encounter (Signed)
Says diarrhea, dizziness and nausea has subsided.  Only having back pain at night which is better than before but still there.  Advised to stop Flomax.

## 2018-02-19 ENCOUNTER — Ambulatory Visit (INDEPENDENT_AMBULATORY_CARE_PROVIDER_SITE_OTHER): Payer: Federal, State, Local not specified - PPO | Admitting: Obstetrics and Gynecology

## 2018-02-19 ENCOUNTER — Encounter: Payer: Self-pay | Admitting: Obstetrics and Gynecology

## 2018-02-19 VITALS — BP 128/74 | HR 100 | Wt 239.0 lb

## 2018-02-19 DIAGNOSIS — N200 Calculus of kidney: Secondary | ICD-10-CM

## 2018-02-19 DIAGNOSIS — Z3403 Encounter for supervision of normal first pregnancy, third trimester: Secondary | ICD-10-CM

## 2018-02-19 DIAGNOSIS — F32A Depression, unspecified: Secondary | ICD-10-CM

## 2018-02-19 DIAGNOSIS — B354 Tinea corporis: Secondary | ICD-10-CM | POA: Insufficient documentation

## 2018-02-19 DIAGNOSIS — F419 Anxiety disorder, unspecified: Secondary | ICD-10-CM

## 2018-02-19 DIAGNOSIS — O26832 Pregnancy related renal disease, second trimester: Secondary | ICD-10-CM

## 2018-02-19 DIAGNOSIS — O99342 Other mental disorders complicating pregnancy, second trimester: Secondary | ICD-10-CM

## 2018-02-19 DIAGNOSIS — O98812 Other maternal infectious and parasitic diseases complicating pregnancy, second trimester: Secondary | ICD-10-CM

## 2018-02-19 DIAGNOSIS — F329 Major depressive disorder, single episode, unspecified: Secondary | ICD-10-CM

## 2018-02-19 DIAGNOSIS — Z331 Pregnant state, incidental: Secondary | ICD-10-CM

## 2018-02-19 DIAGNOSIS — Z1389 Encounter for screening for other disorder: Secondary | ICD-10-CM

## 2018-02-19 LAB — POCT URINALYSIS DIPSTICK OB
Glucose, UA: NEGATIVE — AB
KETONES UA: NEGATIVE
NITRITE UA: NEGATIVE
PROTEIN: NEGATIVE

## 2018-02-19 MED ORDER — NYSTATIN-TRIAMCINOLONE 100000-0.1 UNIT/GM-% EX OINT: 1.0000 "application " | TOPICAL_OINTMENT | Freq: Two times a day (BID) | CUTANEOUS | 99 refills | Status: DC

## 2018-02-19 NOTE — Progress Notes (Signed)
Patient ID: Melody Stewart, female   DOB: [birth year], 32 y.o.   MRN: [id number]    LOW-RISK PREGNANCY VISIT Patient name: Melody Stewart MRN [id number]  Date of birth: 1985/09/03 Chief Complaint:   Routine Prenatal Visit  History of Present Illness:   Melody Stewart is a 32 y.o. G96P0000 female at [redacted]w[redacted]d with an Estimated Date of Delivery: 05/02/18 being seen today for ongoing management of a low-risk pregnancy.  Today she reports She has a rash under her breasts. She thought it was work related, and tried topical neosporin. The rash was scabbing over but has been itching consistently for the past 2 weeks. 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  Krista Blue. Bleeding: None.  Movement: Present. She says that she is peeing blood due to kidney stone and dehydration but denies any leaking of fluid. Review of Systems:   Pertinent items are noted in HPI Denies abnormal vaginal discharge w/ itching/odor/irritation, headaches, visual changes, shortness of breath, chest pain, abdominal pain, severe nausea/vomiting, bowel movements unless otherwise stated above. Pertinent History Reviewed:  Reviewed past medical,surgical, social, obstetrical and family history.  Reviewed problem list, medications and allergies. Physical Assessment:   Vitals:   02/19/18 0837  BP: 128/74  Pulse: 100  Weight: 239 lb (108.4 kg)  Body mass index is 38.58 kg/m.        Physical Examination:   General appearance: Well appearing, and in no distress  Mental status: Alert, oriented to person, place, and time  Skin: Warm & dry, mild hyperpigmented skin under breast  Cardiovascular: Normal heart rate noted  Respiratory: Normal respiratory effort, no distress  Abdomen: Soft, gravid, nontender  Pelvic: Cervical exam deferred         Extremities: Edema: Trace  Fetal Status: Fetal Heart Rate (bpm): 146 Fundal Height:  29 cm Movement: Present    Results for orders placed or performed in visit on 02/19/18 (from the past 24 hour(s))  POC Urinalysis Dipstick OB   Collection Time: 02/19/18  8:41 AM  Result Value Ref Range   Color, UA     Clarity, UA     Glucose, UA Negative (A) (none)   Bilirubin, UA     Ketones, UA neg    Spec Grav, UA  1.010 - 1.025   Blood, UA large    pH, UA  5.0 - 8.0   POC Protein UA Negative Negative, Trace   Urobilinogen, UA  0.2 or 1.0 E.U./dL   Nitrite, UA neg    Leukocytes, UA Large (3+) (A) Negative   Appearance     Odor      Assessment & Plan:  1) Low-risk pregnancy G1P0000 at [redacted]w[redacted]d with an Estimated Date of Delivery: 05/02/18   2) Tinea corporis   Meds: No orders of the defined types were placed in this encounter.  Labs/procedures today: None  Plan:  Continue routine obstetrical care Rx Mycolog    Follow-up: No follow-ups on file.  Orders Placed This Encounter  Procedures  . POC Urinalysis Dipstick OB   By signing my name below, I, Samul Dada, attest that this documentation has been prepared under the direction and in the presence of Jonnie Kind, MD. Electronically Signed: Parker. 02/19/18. 8:59 AM.  I personally performed the services described in this documentation, which was SCRIBED in my presence. The recorded information has been  reviewed and considered accurate. It has been edited as necessary during review. Jonnie Kind, MD

## 2018-02-19 NOTE — Assessment & Plan Note (Deleted)
Add SSRI or SSNRI  At 36 wks.

## 2018-02-21 LAB — URINE CULTURE: ORGANISM ID, BACTERIA: NO GROWTH

## 2018-03-05 ENCOUNTER — Other Ambulatory Visit: Payer: Self-pay

## 2018-03-05 ENCOUNTER — Ambulatory Visit (INDEPENDENT_AMBULATORY_CARE_PROVIDER_SITE_OTHER): Payer: Federal, State, Local not specified - PPO | Admitting: Obstetrics and Gynecology

## 2018-03-05 ENCOUNTER — Encounter: Payer: Self-pay | Admitting: Obstetrics and Gynecology

## 2018-03-05 VITALS — BP 115/65 | HR 107 | Wt 245.0 lb

## 2018-03-05 DIAGNOSIS — R319 Hematuria, unspecified: Secondary | ICD-10-CM | POA: Diagnosis not present

## 2018-03-05 DIAGNOSIS — Z23 Encounter for immunization: Secondary | ICD-10-CM

## 2018-03-05 DIAGNOSIS — Z1389 Encounter for screening for other disorder: Secondary | ICD-10-CM

## 2018-03-05 DIAGNOSIS — Z3A31 31 weeks gestation of pregnancy: Secondary | ICD-10-CM

## 2018-03-05 DIAGNOSIS — Z331 Pregnant state, incidental: Secondary | ICD-10-CM

## 2018-03-05 DIAGNOSIS — Z3403 Encounter for supervision of normal first pregnancy, third trimester: Secondary | ICD-10-CM

## 2018-03-05 LAB — POCT URINALYSIS DIPSTICK OB
Glucose, UA: NEGATIVE — AB
Ketones, UA: NEGATIVE
Leukocytes, UA: NEGATIVE
Nitrite, UA: NEGATIVE

## 2018-03-05 NOTE — Progress Notes (Signed)
Patient ID: [name], female   DOB: 1985/12/16, 32 y.o.   MRN: [id number]    LOW-RISK PREGNANCY VISIT Patient name: Melody Stewart MRN [id number]  Date of birth: 1986-06-30 Chief Complaint:   Routine Prenatal Visit  History of Present Illness:   Melody Stewart is a 32 y.o. G51P0000 female at [redacted]w[redacted]d with an Estimated Date of Delivery: 05/02/18 being seen today for ongoing management of a low-risk pregnancy.  Today she reports no complaints. Contractions: Not present. Vag. Bleeding: None.  Movement: Present. denies leaking of fluid. Review of Systems:   Pertinent items are noted in HPI Denies abnormal vaginal discharge w/ itching/odor/irritation, headaches, visual changes, shortness of breath, chest pain, abdominal pain, severe nausea/vomiting, or problems with urination or bowel movements unless otherwise stated above. Pertinent History Reviewed:  Reviewed past medical,surgical, social, obstetrical and family history.  Reviewed problem list, medications and allergies. Physical Assessment:   Vitals:   03/05/18 1124  BP: 115/65  Pulse: (!) 107  Weight: 245 lb (111.1 kg)  Body mass index is 39.54 kg/m.        Physical Examination:   General appearance: Well appearing, and in no distress  Mental status: Alert, oriented to person, place, and time  Skin: Warm & dry  Cardiovascular: Normal heart rate noted  Respiratory: Normal respiratory effort, no distress  Abdomen: Soft, gravid, nontender  Pelvic: Cervical exam deferred         Extremities: Edema: Trace  Fetal Status: Fetal Heart Rate (bpm): 149 Fundal Height: 34 cm Movement: Present    Results for orders placed or performed in visit on 03/05/18 (from the past 24 hour(s))  POC Urinalysis Dipstick OB   Collection Time: 03/05/18 11:25 AM  Result Value Ref Range   Color, UA     Clarity, UA     Glucose, UA Negative (A) (none)   Bilirubin, UA     Ketones, UA neg    Spec Grav, UA     Blood, UA small    pH, UA     POC Protein  UA Trace Negative, Trace   Urobilinogen, UA     Nitrite, UA neg    Leukocytes, UA Negative Negative   Appearance     Odor      Assessment & Plan:  1) Low-risk pregnancy G1P0000 at [redacted]w[redacted]d with an Estimated Date of Delivery: 05/02/18     Meds: No orders of the defined types were placed in this encounter.  Labs/procedures today: Urine culture, T-dap  Plan:  Continue routine obstetrical care F/u in 2 weeks  Follow-up: Return in about 2 weeks (around 03/19/2018) for [place].  Orders Placed This Encounter  Procedures  . Urine Culture  . Tdap vaccine greater than or equal to 7yo IM  . POC Urinalysis Dipstick OB   By signing my name below, I, Samul Dada, attest that this documentation has been prepared under the direction and in the presence of Jonnie Kind, MD. Electronically Signed: [place]. 03/05/18. 12:02 PM.  I personally performed the services described in this documentation, which was SCRIBED in my presence. The recorded information has been reviewed and considered accurate. It has been edited as necessary during review. Jonnie Kind, MD

## 2018-03-07 LAB — URINE CULTURE: Organism ID, Bacteria: NO GROWTH

## 2018-03-11 DIAGNOSIS — M5489 Other dorsalgia: Secondary | ICD-10-CM | POA: Diagnosis not present

## 2018-03-11 DIAGNOSIS — O26839 Pregnancy related renal disease, unspecified trimester: Secondary | ICD-10-CM | POA: Diagnosis not present

## 2018-03-11 DIAGNOSIS — Z3A32 32 weeks gestation of pregnancy: Secondary | ICD-10-CM | POA: Diagnosis not present

## 2018-03-11 DIAGNOSIS — N132 Hydronephrosis with renal and ureteral calculous obstruction: Secondary | ICD-10-CM | POA: Diagnosis not present

## 2018-03-11 DIAGNOSIS — R109 Unspecified abdominal pain: Secondary | ICD-10-CM | POA: Diagnosis not present

## 2018-03-11 DIAGNOSIS — O26893 Other specified pregnancy related conditions, third trimester: Secondary | ICD-10-CM | POA: Diagnosis not present

## 2018-03-12 ENCOUNTER — Telehealth: Payer: Self-pay | Admitting: *Deleted

## 2018-03-12 DIAGNOSIS — Z3A32 32 weeks gestation of pregnancy: Secondary | ICD-10-CM | POA: Diagnosis not present

## 2018-03-12 DIAGNOSIS — N132 Hydronephrosis with renal and ureteral calculous obstruction: Secondary | ICD-10-CM | POA: Diagnosis not present

## 2018-03-12 DIAGNOSIS — R109 Unspecified abdominal pain: Secondary | ICD-10-CM | POA: Diagnosis not present

## 2018-03-12 DIAGNOSIS — O26893 Other specified pregnancy related conditions, third trimester: Secondary | ICD-10-CM | POA: Diagnosis not present

## 2018-03-12 NOTE — Telephone Encounter (Signed)
Pt called stating that she was seen at [facility] in [place] yesterday for kidney stones. She states that she received treatment there and was discharged home. She is asking if she needs to be seen in our office sooner than next Wednesday. Advised pt that unless her s/s worsen or fail to improve then she does not. She states that baby is moving fine. No c/o bleeding, LOF or contractions.

## 2018-03-15 ENCOUNTER — Encounter (HOSPITAL_COMMUNITY): Payer: Self-pay | Admitting: Student

## 2018-03-15 ENCOUNTER — Inpatient Hospital Stay (HOSPITAL_COMMUNITY): Payer: Federal, State, Local not specified - PPO

## 2018-03-15 ENCOUNTER — Other Ambulatory Visit: Payer: Self-pay

## 2018-03-15 ENCOUNTER — Inpatient Hospital Stay (HOSPITAL_COMMUNITY)
Admission: AD | Admit: 2018-03-15 | Discharge: 2018-03-17 | DRG: 818 | Disposition: A | Discharge status: Home / Self Care | Payer: Federal, State, Local not specified - PPO | Attending: Obstetrics and Gynecology | Admitting: Obstetrics and Gynecology

## 2018-03-15 DIAGNOSIS — Z3A33 33 weeks gestation of pregnancy: Secondary | ICD-10-CM | POA: Diagnosis not present

## 2018-03-15 DIAGNOSIS — O26833 Pregnancy related renal disease, third trimester: Principal | ICD-10-CM

## 2018-03-15 DIAGNOSIS — R109 Unspecified abdominal pain: Secondary | ICD-10-CM | POA: Diagnosis not present

## 2018-03-15 DIAGNOSIS — N200 Calculus of kidney: Secondary | ICD-10-CM | POA: Diagnosis not present

## 2018-03-15 DIAGNOSIS — N179 Acute kidney failure, unspecified: Secondary | ICD-10-CM | POA: Diagnosis not present

## 2018-03-15 DIAGNOSIS — O99213 Obesity complicating pregnancy, third trimester: Secondary | ICD-10-CM | POA: Diagnosis present

## 2018-03-15 DIAGNOSIS — Z87891 Personal history of nicotine dependence: Secondary | ICD-10-CM

## 2018-03-15 DIAGNOSIS — M549 Dorsalgia, unspecified: Secondary | ICD-10-CM | POA: Diagnosis not present

## 2018-03-15 DIAGNOSIS — N132 Hydronephrosis with renal and ureteral calculous obstruction: Secondary | ICD-10-CM | POA: Diagnosis not present

## 2018-03-15 DIAGNOSIS — O9989 Other specified diseases and conditions complicating pregnancy, childbirth and the puerperium: Secondary | ICD-10-CM

## 2018-03-15 LAB — COMPREHENSIVE METABOLIC PANEL
ALT: 12 U/L (ref 0–44)
ANION GAP: 11 (ref 5–15)
AST: 16 U/L (ref 15–41)
Albumin: 2.9 g/dL — ABNORMAL LOW (ref 3.5–5.0)
Alkaline Phosphatase: 79 U/L (ref 38–126)
BUN: 15 mg/dL (ref 6–20)
CHLORIDE: 106 mmol/L (ref 98–111)
CO2: 18 mmol/L — ABNORMAL LOW (ref 22–32)
Calcium: 8.9 mg/dL (ref 8.9–10.3)
Creatinine, Ser: 1.28 mg/dL — ABNORMAL HIGH (ref 0.44–1.00)
GFR calc non Af Amer: 54 mL/min — ABNORMAL LOW (ref >60)
Glucose, Bld: 98 mg/dL (ref 70–99)
POTASSIUM: 4.4 mmol/L (ref 3.5–5.1)
Sodium: 135 mmol/L (ref 135–145)
Total Bilirubin: 0.2 mg/dL — ABNORMAL LOW (ref 0.3–1.2)
Total Protein: 6.3 g/dL — ABNORMAL LOW (ref 6.5–8.1)

## 2018-03-15 LAB — DIFFERENTIAL
BASOS ABS: 0 10*3/uL (ref 0.0–0.1)
Basophils Relative: 0 %
Eosinophils Absolute: 0.1 10*3/uL (ref 0.0–0.7)
Eosinophils Relative: 1 %
LYMPHS ABS: 1.5 10*3/uL (ref 0.7–4.0)
LYMPHS PCT: 11 %
Monocytes Absolute: 0.5 10*3/uL (ref 0.1–1.0)
Monocytes Relative: 4 %
NEUTROS PCT: 84 %
Neutro Abs: 11.7 10*3/uL — ABNORMAL HIGH (ref 1.7–7.7)

## 2018-03-15 LAB — URINALYSIS, ROUTINE W REFLEX MICROSCOPIC
BILIRUBIN URINE: NEGATIVE
Glucose, UA: NEGATIVE mg/dL
Ketones, ur: NEGATIVE mg/dL
Nitrite: NEGATIVE
PH: 6 (ref 5.0–8.0)
Protein, ur: NEGATIVE mg/dL
Specific Gravity, Urine: 1.011 (ref 1.005–1.030)

## 2018-03-15 LAB — CBC
HCT: 33.6 % — ABNORMAL LOW (ref 36.0–46.0)
Hemoglobin: 11.3 g/dL — ABNORMAL LOW (ref 12.0–15.0)
MCH: 29.4 pg (ref 26.0–34.0)
MCHC: 33.6 g/dL (ref 30.0–36.0)
MCV: 87.5 fL (ref 78.0–100.0)
PLATELETS: 326 10*3/uL (ref 150–400)
RBC: 3.84 MIL/uL — ABNORMAL LOW (ref 3.87–5.11)
RDW: 14.2 % (ref 11.5–15.5)
WBC: 14.2 10*3/uL — AB (ref 4.0–10.5)

## 2018-03-15 MED ORDER — SODIUM CHLORIDE 0.9 % IV SOLN
INTRAVENOUS | Status: AC
  Administered 2018-03-15 – 2018-03-16 (×2): via INTRAVENOUS

## 2018-03-15 MED ORDER — HYDROMORPHONE HCL 1 MG/ML IJ SOLN
1.0000 mg | Freq: Once | INTRAMUSCULAR | Status: AC
  Administered 2018-03-15: 1 mg via INTRAVENOUS
  Filled 2018-03-15: qty 1

## 2018-03-15 MED ORDER — ACETAMINOPHEN 325 MG PO TABS: 650.0000 mg | ORAL_TABLET | ORAL | Status: DC | PRN

## 2018-03-15 MED ORDER — SODIUM CHLORIDE 0.9 % IV SOLN
2.0000 g | INTRAVENOUS | Status: DC
  Administered 2018-03-15 – 2018-03-16 (×2): 2 g via INTRAVENOUS
  Filled 2018-03-15: qty 20
  Filled 2018-03-15: qty 2
  Filled 2018-03-15: qty 20

## 2018-03-15 MED ORDER — ONDANSETRON HCL 4 MG/2ML IJ SOLN
4.0000 mg | Freq: Once | INTRAMUSCULAR | Status: AC
  Administered 2018-03-15: 4 mg via INTRAVENOUS
  Filled 2018-03-15: qty 2

## 2018-03-15 MED ORDER — PRENATAL MULTIVITAMIN CH: 1.0000 | ORAL_TABLET | Freq: Every day | ORAL | Status: DC

## 2018-03-15 MED ORDER — DOCUSATE SODIUM 100 MG PO CAPS: 100.0000 mg | ORAL_CAPSULE | Freq: Two times a day (BID) | ORAL | Status: DC | PRN

## 2018-03-15 MED ORDER — SODIUM CHLORIDE 0.9 % IV SOLN
INTRAVENOUS | Status: AC
  Administered 2018-03-15 (×2): via INTRAVENOUS

## 2018-03-15 MED ORDER — CALCIUM CARBONATE ANTACID 500 MG PO CHEW: 2.0000 | CHEWABLE_TABLET | ORAL | Status: DC | PRN

## 2018-03-15 MED ORDER — ONDANSETRON 4 MG PO TBDP
4.0000 mg | ORAL_TABLET | Freq: Four times a day (QID) | ORAL | Status: DC | PRN
  Administered 2018-03-15: 4 mg via ORAL
  Filled 2018-03-15 (×2): qty 1

## 2018-03-15 MED ORDER — DOCUSATE SODIUM 100 MG PO CAPS: 100.0000 mg | ORAL_CAPSULE | Freq: Every day | ORAL | Status: DC

## 2018-03-15 MED ORDER — HYDROMORPHONE HCL 2 MG PO TABS
2.0000 mg | ORAL_TABLET | ORAL | Status: DC | PRN
  Administered 2018-03-15: 2 mg via ORAL
  Filled 2018-03-15: qty 1

## 2018-03-15 MED ORDER — HYDROMORPHONE HCL 2 MG PO TABS
2.0000 mg | ORAL_TABLET | ORAL | Status: DC | PRN
  Administered 2018-03-15 – 2018-03-16 (×2): 2 mg via ORAL
  Filled 2018-03-15 (×2): qty 1

## 2018-03-15 MED ORDER — LACTATED RINGERS IV SOLN
INTRAVENOUS | Status: DC
  Administered 2018-03-15: 10:00:00 via INTRAVENOUS

## 2018-03-15 MED ORDER — ZOLPIDEM TARTRATE 5 MG PO TABS: 5.0000 mg | ORAL_TABLET | Freq: Every evening | ORAL | Status: DC | PRN

## 2018-03-15 MED ORDER — HYDROMORPHONE HCL 1 MG/ML IJ SOLN
1.0000 mg | INTRAMUSCULAR | Status: AC | PRN
  Administered 2018-03-15 – 2018-03-16 (×3): 1 mg via INTRAVENOUS
  Filled 2018-03-15 (×3): qty 1

## 2018-03-15 MED ORDER — LACTATED RINGERS IV BOLUS
1000.0000 mL | Freq: Once | INTRAVENOUS | Status: AC
  Administered 2018-03-15: 1000 mL via INTRAVENOUS

## 2018-03-15 MED ORDER — HYDROMORPHONE HCL 1 MG/ML IJ SOLN
0.5000 mg | INTRAMUSCULAR | Status: DC | PRN
  Administered 2018-03-15: 0.5 mg via INTRAVENOUS
  Filled 2018-03-15: qty 1

## 2018-03-15 MED ORDER — PROMETHAZINE HCL 25 MG/ML IJ SOLN
12.5000 mg | Freq: Four times a day (QID) | INTRAMUSCULAR | Status: DC | PRN
  Administered 2018-03-15: 25 mg via INTRAVENOUS
  Administered 2018-03-16: 12.5 mg via INTRAVENOUS
  Administered 2018-03-16: 25 mg via INTRAVENOUS
  Filled 2018-03-15 (×3): qty 1

## 2018-03-15 NOTE — MAU Provider Note (Signed)
Chief Complaint:  Flank Pain and Dysuria   First Provider Initiated Contact with Patient 03/15/18 [phone]     HPI: Melody Stewart is a 32 y.o. G1P0000 at [redacted]w[redacted]d who presents to maternity admissions reporting flank pain.  During this pregnancy she has had persistent UTI w/3 positive urine cultures & was diagnosed with a kidney stone.  She is taking keflex nightly for suppression. Was prescribed flomax for stone, but stopped taking it after a week d/t side effects of diarrhea & dizziness.  Was seen at [facility] on Tuesday for flank pain & was admitted overnight for IV fluids and pain management. Symptoms have worsened since last night. Has had burning with urination for the last few days, gross hematuria x 1 episode last night (has had hematuria intermittently during this pregnancy), and worsening flank pain. Vomited once last night & has waves of nausea associated with her pain.  Denies fever/chills, LOF, vaginal bleeding, or abdominal pain.  Positive fetal movement.   Location: left flank Quality: sharp & throbbing Severity: 9/10 in pain scale Duration: 4 days Timing: constant Modifying factors: took dilaudid without relief. Nothing makes better or worse.  Associated signs and symptoms: nausea   Past Medical History:  Diagnosis Date  . Anxiety and depression 09/14/2015  . Asthma   . BV (bacterial vaginosis) 02/24/2016  . Colitis   . Fibroadenoma of right breast   . GERD (gastroesophageal reflux disease)   . IBS (irritable bowel syndrome)   . Screening for STD (sexually transmitted disease) 09/14/2015  . UTI (urinary tract infection)   . Vaginal itching 09/14/2015   OB History  Gravida Para Term Preterm AB Living  1 0 0 0 0 0  SAB TAB Ectopic Multiple Live Births  0 0 0 0      # Outcome Date GA Lbr Len/2nd Weight Sex Delivery Anes PTL Lv  1 Current            Past Surgical History:  Procedure Laterality Date  . KNEE SURGERY    . TONSILLECTOMY    . WISDOM TOOTH EXTRACTION      Family History  Problem Relation Age of Onset  . Hypertension Mother   . Other Mother        mother had child hydrocephic-died  . Hypertension Father   . Heart disease Paternal Grandfather   . Stroke Paternal Grandfather   . Thyroid disease Maternal Grandmother   . Heart disease Maternal Grandmother   . Cancer Paternal Grandmother        breast  . Thyroid disease Maternal Aunt   . Cancer Paternal Aunt        breast   Social History   Tobacco Use  . Smoking status: Former Smoker    Types: Cigarettes  . Smokeless tobacco: Never Used  Substance Use Topics  . Alcohol use: No    Alcohol/week: 1.0 standard drinks    Types: 1 Standard drinks or equivalent per week    Frequency: Never    Comment: occ; not now  . Drug use: No   Allergies  Allergen Reactions  . Hydrocodone     Make her sick and constipated.   Medications Prior to Admission  Medication Sig Dispense Refill Last Dose  . CALCIUM PO Take 1 tablet by mouth daily.    Not Taking  . cephALEXin (KEFLEX) 500 MG capsule Take 1 capsule (500 mg total) by mouth at bedtime. 30 capsule 6 Taking  . Docosahexaenoic Acid (DHA PO) Take 1  tablet by mouth daily.    Taking  . nystatin-triamcinolone ointment (MYCOLOG) Apply 1 application topically 2 (two) times daily. To affected area. (Patient not taking: Reported on 03/05/2018) 30 g prn Not Taking  . phenazopyridine (PYRIDIUM) 95 MG tablet Take 1 tablet (95 mg total) by mouth 3 (three) times daily as needed for pain. (Patient not taking: Reported on 12/04/2017) 10 tablet 0 Not Taking  . Prenatal Vit-Fe Fumarate-FA (PRENATAL VITAMIN PO) Take 1 tablet by mouth daily.    Taking  . Probiotic Product (PROBIOTIC DAILY PO) Take 1 tablet by mouth daily.   Taking  . promethazine (PHENERGAN) 25 MG suppository Place 1 suppository (25 mg total) rectally every 6 (six) hours as needed for nausea or vomiting. MAY ALSO BE USED VAGINALLY (Patient not taking: Reported on 01/29/2018) 12 each 0 Not Taking   . tamsulosin (FLOMAX) 0.4 MG CAPS capsule Take 1 capsule (0.4 mg total) by mouth daily after breakfast. (Patient not taking: Reported on 02/19/2018) 30 capsule 0 Not Taking  . VENTOLIN HFA 108 (90 Base) MCG/ACT inhaler Inhale 2 puffs into the lungs every 6 (six) hours as needed for wheezing.    Taking    I have reviewed patient's Past Medical Hx, Surgical Hx, Family Hx, Social Hx, medications and allergies.   ROS:  Review of Systems  Constitutional: Negative.   Gastrointestinal: Positive for nausea and vomiting (once last night). Negative for abdominal pain, constipation and diarrhea.  Genitourinary: Positive for dysuria, flank pain and hematuria. Negative for frequency, vaginal bleeding and vaginal discharge.    Physical Exam   Patient Vitals for the past 24 hrs:  BP Temp Temp src Pulse Resp SpO2 Weight  03/15/18 1124 - 97.9 F (36.6 C) Oral - - - -  03/15/18 6063 124/70 (!) 97.4 F (36.3 C) Oral 94 19 100 % 113.4 kg    Constitutional: Well-developed, well-nourished female in no acute distress.  Cardiovascular: normal rate & rhythm, no murmur Respiratory: normal effort, lung sounds clear throughout GI: Abd soft, non-tender, gravid appropriate for gestational age. Pos BS x 4. Positive  left sided CVAT MS: Extremities nontender, no edema, normal ROM Neurologic: Alert and oriented x 4.   NST:  Baseline: 145 bpm, Variability: Good {> 6 bpm), Accelerations: Reactive and Decelerations: Absent   Labs: Results for orders placed or performed during the hospital encounter of 03/15/18 (from the past 24 hour(s))  Urinalysis, Routine w reflex microscopic     Status: Abnormal   Collection Time: 03/15/18  8:35 AM  Result Value Ref Range   Color, Urine YELLOW YELLOW   APPearance HAZY (A) CLEAR   Specific Gravity, Urine 1.011 1.005 - 1.030   pH 6.0 5.0 - 8.0   Glucose, UA NEGATIVE NEGATIVE mg/dL   Hgb urine dipstick MODERATE (A) NEGATIVE   Bilirubin Urine NEGATIVE NEGATIVE   Ketones, ur  NEGATIVE NEGATIVE mg/dL   Protein, ur NEGATIVE NEGATIVE mg/dL   Nitrite NEGATIVE NEGATIVE   Leukocytes, UA MODERATE (A) NEGATIVE   RBC / HPF 11-20 0 - 5 RBC/hpf   WBC, UA 21-50 0 - 5 WBC/hpf   Bacteria, UA MANY (A) NONE SEEN   Squamous Epithelial / LPF 0-5 0 - 5   Mucus PRESENT   CBC     Status: Abnormal   Collection Time: 03/15/18  9:10 AM  Result Value Ref Range   WBC 14.2 (H) 4.0 - 10.5 K/uL   RBC 3.84 (L) 3.87 - 5.11 MIL/uL   Hemoglobin 11.3 (L) 12.0 -  15.0 g/dL   HCT 33.6 (L) 36.0 - 46.0 %   MCV 87.5 78.0 - 100.0 fL   MCH 29.4 26.0 - 34.0 pg   MCHC 33.6 30.0 - 36.0 g/dL   RDW 14.2 11.5 - 15.5 %   Platelets 326 150 - 400 K/uL  Differential     Status: Abnormal   Collection Time: 03/15/18  9:10 AM  Result Value Ref Range   Neutrophils Relative % 84 %   Neutro Abs 11.7 (H) 1.7 - 7.7 K/uL   Lymphocytes Relative 11 %   Lymphs Abs 1.5 0.7 - 4.0 K/uL   Monocytes Relative 4 %   Monocytes Absolute 0.5 0.1 - 1.0 K/uL   Eosinophils Relative 1 %   Eosinophils Absolute 0.1 0.0 - 0.7 K/uL   Basophils Relative 0 %   Basophils Absolute 0.0 0.0 - 0.1 K/uL  Comprehensive metabolic panel     Status: Abnormal   Collection Time: 03/15/18 10:37 AM  Result Value Ref Range   Sodium 135 135 - 145 mmol/L   Potassium 4.4 3.5 - 5.1 mmol/L   Chloride 106 98 - 111 mmol/L   CO2 18 (L) 22 - 32 mmol/L   Glucose, Bld 98 70 - 99 mg/dL   BUN 15 6 - 20 mg/dL   Creatinine, Ser 1.28 (H) 0.44 - 1.00 mg/dL   Calcium 8.9 8.9 - 10.3 mg/dL   Total Protein 6.3 (L) 6.5 - 8.1 g/dL   Albumin 2.9 (L) 3.5 - 5.0 g/dL   AST 16 15 - 41 U/L   ALT 12 0 - 44 U/L   Alkaline Phosphatase 79 38 - 126 U/L   Total Bilirubin 0.2 (L) 0.3 - 1.2 mg/dL   GFR calc non Af Amer 54 (L) >60 mL/min   GFR calc Af Amer >60 >60 mL/min   Anion gap 11 5 - 15    Imaging:  US Renal  Result Date: 03/15/2018 CLINICAL DATA:  Pregnant female in the third trimester. Left flank pain. Known renal stone. EXAM: RENAL / URINARY TRACT  ULTRASOUND COMPLETE COMPARISON:  03/11/2018 renal sonogram FINDINGS: Right Kidney: Length: 12.5 cm. Normal right renal parenchymal thickness and echogenicity. Moderate right hydroureteronephrosis is not appreciably changed. No right renal mass. Left Kidney: Length: 12.7 cm. Normal left renal parenchymal thickness and echogenicity. Moderate left hydroureteronephrosis is stable. No change in position of obstructing 1 cm stone in the proximal left ureter. No left renal mass. Bladder: Bladder completely collapsed and not well evaluated on this scan. IMPRESSION: 1. Stable position of obstructing 1 cm proximal left ureteral stone. Stable moderate left hydroureteronephrosis. 2. Stable moderate right hydroureteronephrosis. 3. Collapsed bladder, not well evaluated. Electronically Signed   By: Ilona Sorrel M.D.   On: 03/15/2018 10:30    MAU Course: Orders Placed This Encounter  Procedures  . Culture, OB Urine  . US RENAL  . Urinalysis, Routine w reflex microscopic  . CBC  . Comprehensive metabolic panel  . Comprehensive metabolic panel  . Differential  . Diet regular Room service appropriate? Yes; Fluid consistency: Thin  . SCDs  . Notify physician (specify)  . Vital signs  . Bed rest with bathroom privileges  . Defer vaginal exam for vaginal bleeding or PROM <37 weeks  . Initiate Oral Care Protocol  . Initiate Carrier Fluid Protocol  . Full code  . Fetal nonstress test  . Place in observation (patient's expected length of stay will be less than 2 midnights)   Meds ordered this encounter  Medications  . FOLLOWED BY Linked Order Group   . lactated ringers bolus 1,000 mL   . lactated ringers infusion  . ondansetron (ZOFRAN) injection 4 mg  . HYDROmorphone (DILAUDID) injection 1 mg  . HYDROmorphone (DILAUDID) injection 0.5 mg  . 0.9 %  sodium chloride infusion  . acetaminophen (TYLENOL) tablet 650 mg  . zolpidem (AMBIEN) tablet 5 mg  . docusate sodium (COLACE) capsule 100 mg  . calcium  carbonate (TUMS - dosed in mg elemental calcium) chewable tablet 400 mg of elemental calcium  . prenatal multivitamin tablet 1 tablet  . HYDROmorphone (DILAUDID) tablet 2 mg  . ondansetron (ZOFRAN-ODT) disintegrating tablet 4 mg  . cefTRIAXone (ROCEPHIN) 2 g in sodium chloride 0.9 % 100 mL IVPB    Order Specific Question:   Antibiotic Indication:    Answer:   UTI    MDM: VSS, pt afebrile. Left CVAT with known kidney stone & hx UTIs this pregnancy. U/a with moderate hgb & moderate leuks, will send for culture. Renal ultrasound shows left hydro & obstructing stone. CBC shows leukocytosis with shift.  Pt given IVF bolus x 1 bag followed by 250/hr. IV dilaudid helped some with pain but pain is returning & pt requesting medication.   Obstructing stone & ?UTI. D/w Dr. Ilda Basset, will admit.    Assessment: 1. Kidney stone complicating pregnancy, third trimester   2. Left flank pain    P: Admit to 3rd floor per c/w Dr. Ilda Basset Will likely have urology consult NST daily starting tomorrow IV fluids 250cc/hr x 6 hrs, then saline lock Urine culture & CMP pending Rocephin 2 gm Q24 hrs Oral pain management  Jorje Guild, NP 03/15/2018 11:39 AM

## 2018-03-15 NOTE — MAU Note (Signed)
Melody Stewart is a 32 y.o. at [redacted]w[redacted]d here in MAU reporting:  +left sided back pain (flank area) Sharp in nature Onset of complaint: ongoing since Tuesday but got constant and worsened around 10pm last night. States was up and down all night.  Pain score: 9/10. Has tried the pain medication she received at her last visit but it has not helped. Endorses going to the hospital Tuesday and was told she had a kidney stone; was discharged Wednesday.  Vitals:   03/15/18 0822  BP: 124/70  Pulse: 94  Resp: 19  Temp: (!) 97.4 F (36.3 C)  SpO2: 100%     FHT:154 Lab orders placed from triage: ua

## 2018-03-15 NOTE — Progress Notes (Addendum)
OB Note  Urology paged again at [phone] Aletha Halim, Brooke Bonito MD [facility] (Faculty Practice) 03/15/2018 Time: 1430  I was able to talk to Dr. Junious Silk at [phone]. Since clinically fine (no fever, able to take PO, WBC stable), will plan on PO hydration and repeat labs in morning. If worse (clinically or labs), then may need surgical intervention.  Durene Romans MD [facility] (Faculty [facility]) 03/15/2018 Time: [phone]

## 2018-03-15 NOTE — MAU Provider Note (Signed)
H&P-OBGYN Chief Complaint:  Flank Pain and Dysuria   First Provider Initiated Contact with Patient 03/15/18 [phone]     HPI: Melody Stewart is a 32 y.o. G1P0000 at [redacted]w[redacted]d who presents to maternity admissions reporting flank pain.  During this pregnancy she has had persistent UTI w/3 positive urine cultures & was diagnosed with a kidney stone.  She is taking keflex nightly for suppression. Was prescribed flomax for stone, but stopped taking it after a week d/t side effects of diarrhea & dizziness.  Was seen at [facility] on Tuesday for flank pain & was admitted overnight for IV fluids and pain management. Symptoms have worsened since last night. Has had burning with urination for the last few days, gross hematuria x 1 episode last night (has had hematuria intermittently during this pregnancy), and worsening flank pain. Vomited once last night & has waves of nausea associated with her pain.  Denies fever/chills, LOF, vaginal bleeding, or abdominal pain.  Positive fetal movement.   Location: left flank Quality: sharp & throbbing Severity: 9/10 in pain scale Duration: 4 days Timing: constant Modifying factors: took dilaudid without relief. Nothing makes better or worse.  Associated signs and symptoms: nausea   Past Medical History:  Diagnosis Date  . Anxiety and depression 09/14/2015  . Asthma   . BV (bacterial vaginosis) 02/24/2016  . Colitis   . Fibroadenoma of right breast   . GERD (gastroesophageal reflux disease)   . IBS (irritable bowel syndrome)   . Screening for STD (sexually transmitted disease) 09/14/2015  . UTI (urinary tract infection)   . Vaginal itching 09/14/2015   OB History  Gravida Para Term Preterm AB Living  1 0 0 0 0 0  SAB TAB Ectopic Multiple Live Births  0 0 0 0      # Outcome Date GA Lbr Len/2nd Weight Sex Delivery Anes PTL Lv  1 Current            Past Surgical History:  Procedure Laterality Date  . KNEE SURGERY    . TONSILLECTOMY    . WISDOM TOOTH  EXTRACTION     Family History  Problem Relation Age of Onset  . Hypertension Mother   . Other Mother        mother had child hydrocephic-died  . Hypertension Father   . Heart disease Paternal Grandfather   . Stroke Paternal Grandfather   . Thyroid disease Maternal Grandmother   . Heart disease Maternal Grandmother   . Cancer Paternal Grandmother        breast  . Thyroid disease Maternal Aunt   . Cancer Paternal Aunt        breast   Social History   Tobacco Use  . Smoking status: Former Smoker    Types: Cigarettes  . Smokeless tobacco: Never Used  Substance Use Topics  . Alcohol use: No    Alcohol/week: 1.0 standard drinks    Types: 1 Standard drinks or equivalent per week    Frequency: Never    Comment: occ; not now  . Drug use: No   Allergies  Allergen Reactions  . Hydrocodone     Make her sick and constipated.   Medications Prior to Admission  Medication Sig Dispense Refill Last Dose  . CALCIUM PO Take 1 tablet by mouth daily.    Not Taking  . cephALEXin (KEFLEX) 500 MG capsule Take 1 capsule (500 mg total) by mouth at bedtime. 30 capsule 6 Taking  . Docosahexaenoic Acid (DHA PO) Take  1 tablet by mouth daily.    Taking  . nystatin-triamcinolone ointment (MYCOLOG) Apply 1 application topically 2 (two) times daily. To affected area. (Patient not taking: Reported on 03/05/2018) 30 g prn Not Taking  . phenazopyridine (PYRIDIUM) 95 MG tablet Take 1 tablet (95 mg total) by mouth 3 (three) times daily as needed for pain. (Patient not taking: Reported on 12/04/2017) 10 tablet 0 Not Taking  . Prenatal Vit-Fe Fumarate-FA (PRENATAL VITAMIN PO) Take 1 tablet by mouth daily.    Taking  . Probiotic Product (PROBIOTIC DAILY PO) Take 1 tablet by mouth daily.   Taking  . promethazine (PHENERGAN) 25 MG suppository Place 1 suppository (25 mg total) rectally every 6 (six) hours as needed for nausea or vomiting. MAY ALSO BE USED VAGINALLY (Patient not taking: Reported on 01/29/2018) 12 each 0  Not Taking  . tamsulosin (FLOMAX) 0.4 MG CAPS capsule Take 1 capsule (0.4 mg total) by mouth daily after breakfast. (Patient not taking: Reported on 02/19/2018) 30 capsule 0 Not Taking  . VENTOLIN HFA 108 (90 Base) MCG/ACT inhaler Inhale 2 puffs into the lungs every 6 (six) hours as needed for wheezing.    Taking    I have reviewed patient's Past Medical Hx, Surgical Hx, Family Hx, Social Hx, medications and allergies.   ROS:  Review of Systems  Constitutional: Negative.   Gastrointestinal: Positive for nausea and vomiting (once last night). Negative for abdominal pain, constipation and diarrhea.  Genitourinary: Positive for dysuria, flank pain and hematuria. Negative for frequency, vaginal bleeding and vaginal discharge.    Physical Exam   Patient Vitals for the past 24 hrs:  BP Temp Temp src Pulse Resp SpO2 Height Weight  03/15/18 1214 121/70 98.7 F (37.1 C) Oral 83 16 99 % 5\' 6"  (1.676 m) 113.4 kg  03/15/18 1124 - 97.9 F (36.6 C) Oral - - - - -  03/15/18 5885 124/70 (!) 97.4 F (36.3 C) Oral 94 19 100 % - 113.4 kg    Constitutional: Well-developed, well-nourished female in no acute distress.  Cardiovascular: normal rate & rhythm, no murmur Respiratory: normal effort, lung sounds clear throughout GI: Abd soft, non-tender, gravid appropriate for gestational age. Pos BS x 4. Positive  left sided CVAT MS: Extremities nontender, no edema, normal ROM Neurologic: Alert and oriented x 4.   NST:  Baseline: 145 bpm, Variability: Good {> 6 bpm), Accelerations: Reactive and Decelerations: Absent   Labs: Results for orders placed or performed during the hospital encounter of 03/15/18 (from the past 24 hour(s))  Urinalysis, Routine w reflex microscopic     Status: Abnormal   Collection Time: 03/15/18  8:35 AM  Result Value Ref Range   Color, Urine YELLOW YELLOW   APPearance HAZY (A) CLEAR   Specific Gravity, Urine 1.011 1.005 - 1.030   pH 6.0 5.0 - 8.0   Glucose, UA NEGATIVE  NEGATIVE mg/dL   Hgb urine dipstick MODERATE (A) NEGATIVE   Bilirubin Urine NEGATIVE NEGATIVE   Ketones, ur NEGATIVE NEGATIVE mg/dL   Protein, ur NEGATIVE NEGATIVE mg/dL   Nitrite NEGATIVE NEGATIVE   Leukocytes, UA MODERATE (A) NEGATIVE   RBC / HPF 11-20 0 - 5 RBC/hpf   WBC, UA 21-50 0 - 5 WBC/hpf   Bacteria, UA MANY (A) NONE SEEN   Squamous Epithelial / LPF 0-5 0 - 5   Mucus PRESENT   CBC     Status: Abnormal   Collection Time: 03/15/18  9:10 AM  Result Value Ref Range  WBC 14.2 (H) 4.0 - 10.5 K/uL   RBC 3.84 (L) 3.87 - 5.11 MIL/uL   Hemoglobin 11.3 (L) 12.0 - 15.0 g/dL   HCT 33.6 (L) 36.0 - 46.0 %   MCV 87.5 78.0 - 100.0 fL   MCH 29.4 26.0 - 34.0 pg   MCHC 33.6 30.0 - 36.0 g/dL   RDW 14.2 11.5 - 15.5 %   Platelets 326 150 - 400 K/uL  Differential     Status: Abnormal   Collection Time: 03/15/18  9:10 AM  Result Value Ref Range   Neutrophils Relative % 84 %   Neutro Abs 11.7 (H) 1.7 - 7.7 K/uL   Lymphocytes Relative 11 %   Lymphs Abs 1.5 0.7 - 4.0 K/uL   Monocytes Relative 4 %   Monocytes Absolute 0.5 0.1 - 1.0 K/uL   Eosinophils Relative 1 %   Eosinophils Absolute 0.1 0.0 - 0.7 K/uL   Basophils Relative 0 %   Basophils Absolute 0.0 0.0 - 0.1 K/uL  Comprehensive metabolic panel     Status: Abnormal   Collection Time: 03/15/18 10:37 AM  Result Value Ref Range   Sodium 135 135 - 145 mmol/L   Potassium 4.4 3.5 - 5.1 mmol/L   Chloride 106 98 - 111 mmol/L   CO2 18 (L) 22 - 32 mmol/L   Glucose, Bld 98 70 - 99 mg/dL   BUN 15 6 - 20 mg/dL   Creatinine, Ser 1.28 (H) 0.44 - 1.00 mg/dL   Calcium 8.9 8.9 - 10.3 mg/dL   Total Protein 6.3 (L) 6.5 - 8.1 g/dL   Albumin 2.9 (L) 3.5 - 5.0 g/dL   AST 16 15 - 41 U/L   ALT 12 0 - 44 U/L   Alkaline Phosphatase 79 38 - 126 U/L   Total Bilirubin 0.2 (L) 0.3 - 1.2 mg/dL   GFR calc non Af Amer 54 (L) >60 mL/min   GFR calc Af Amer >60 >60 mL/min   Anion gap 11 5 - 15    Imaging:  US Renal  Result Date: 03/15/2018 CLINICAL DATA:   Pregnant female in the third trimester. Left flank pain. Known renal stone. EXAM: RENAL / URINARY TRACT ULTRASOUND COMPLETE COMPARISON:  03/11/2018 renal sonogram FINDINGS: Right Kidney: Length: 12.5 cm. Normal right renal parenchymal thickness and echogenicity. Moderate right hydroureteronephrosis is not appreciably changed. No right renal mass. Left Kidney: Length: 12.7 cm. Normal left renal parenchymal thickness and echogenicity. Moderate left hydroureteronephrosis is stable. No change in position of obstructing 1 cm stone in the proximal left ureter. No left renal mass. Bladder: Bladder completely collapsed and not well evaluated on this scan. IMPRESSION: 1. Stable position of obstructing 1 cm proximal left ureteral stone. Stable moderate left hydroureteronephrosis. 2. Stable moderate right hydroureteronephrosis. 3. Collapsed bladder, not well evaluated. Electronically Signed   By: Ilona Sorrel M.D.   On: 03/15/2018 10:30    MAU Course: Orders Placed This Encounter  Procedures  . Culture, OB Urine  . US RENAL  . Urinalysis, Routine w reflex microscopic  . CBC  . Comprehensive metabolic panel  . Differential  . Basic metabolic panel  . CBC with Differential/Platelet  . Diet regular Room service appropriate? Yes; Fluid consistency: Thin  . SCDs  . Notify physician (specify)  . Vital signs  . Bed rest with bathroom privileges  . Defer vaginal exam for vaginal bleeding or PROM <37 weeks  . Initiate Oral Care Protocol  . Initiate Carrier Fluid Protocol  . Full code  .  Fetal nonstress test  . Place in observation (patient's expected length of stay will be less than 2 midnights)   Meds ordered this encounter  Medications  . lactated ringers bolus 1,000 mL  . ondansetron (ZOFRAN) injection 4 mg  . HYDROmorphone (DILAUDID) injection 1 mg  . DISCONTD: lactated ringers infusion  . DISCONTD: HYDROmorphone (DILAUDID) injection 0.5 mg  . 0.9 %  sodium chloride infusion  . acetaminophen  (TYLENOL) tablet 650 mg  . zolpidem (AMBIEN) tablet 5 mg  . docusate sodium (COLACE) capsule 100 mg  . calcium carbonate (TUMS - dosed in mg elemental calcium) chewable tablet 400 mg of elemental calcium  . prenatal multivitamin tablet 1 tablet  . DISCONTD: HYDROmorphone (DILAUDID) tablet 2 mg  . ondansetron (ZOFRAN-ODT) disintegrating tablet 4 mg  . cefTRIAXone (ROCEPHIN) 2 g in sodium chloride 0.9 % 100 mL IVPB    Order Specific Question:   Antibiotic Indication:    Answer:   UTI  . HYDROmorphone (DILAUDID) tablet 2-4 mg  . promethazine (PHENERGAN) injection 12.5-25 mg    MDM: VSS, pt afebrile. Left CVAT with known kidney stone & hx UTIs this pregnancy. U/a with moderate hgb & moderate leuks, will send for culture. Renal ultrasound shows left hydro & obstructing stone. CBC shows leukocytosis with shift.  Pt given IVF bolus x 1 bag followed by 250/hr. IV dilaudid helped some with pain but pain is returning & pt requesting medication.   Obstructing stone & ?UTI. D/w Dr. Ilda Basset, will admit.    Assessment: 1. Kidney stone complicating pregnancy, third trimester   2. Left flank pain    P: Admit to 3rd floor per c/w Dr. Ilda Basset Will likely have urology consult NST daily starting tomorrow IV fluids 250cc/hr x 6 hrs, then saline lock Urine culture & CMP pending Rocephin 2 gm Q24 hrs Oral pain management  Jorje Guild, NP  Attestation of Attending Supervision of Nurse Practitioner: Evaluation and management procedures were performed by the NP under my supervision.  I have seen and examined the patient,  reviewed the NP's note and chart, and I agree with the management and plan.   *Pregnacy: fetal status reassuring. Daily nonstress tests *Renal: d/w urology dr Junious Silk. F/u s/s and repeat labs in AM. Will touch base with them tomorrow. F/u UCx. Continue rocephin *Analgesia: continue PO PRNs. *FEN/GI: MIVF, regular diet. Continue IV PRN anti-emetics *PPx: OOBs, SCDs *Dispo:  pending renal s/s.    Durene Romans MD Attending Center for [employer])

## 2018-03-15 NOTE — H&P (Signed)
See MAU note. 

## 2018-03-16 ENCOUNTER — Encounter (HOSPITAL_COMMUNITY): Payer: Self-pay | Admitting: Anesthesiology

## 2018-03-16 ENCOUNTER — Encounter (HOSPITAL_COMMUNITY)
Admission: AD | Disposition: A | Discharge status: Home / Self Care | Payer: Self-pay | Source: Home / Self Care | Attending: Obstetrics and Gynecology

## 2018-03-16 ENCOUNTER — Inpatient Hospital Stay (HOSPITAL_COMMUNITY): Payer: Federal, State, Local not specified - PPO | Admitting: Anesthesiology

## 2018-03-16 ENCOUNTER — Inpatient Hospital Stay (HOSPITAL_COMMUNITY): Payer: Federal, State, Local not specified - PPO

## 2018-03-16 ENCOUNTER — Observation Stay (HOSPITAL_COMMUNITY): Payer: Federal, State, Local not specified - PPO

## 2018-03-16 DIAGNOSIS — N179 Acute kidney failure, unspecified: Secondary | ICD-10-CM | POA: Diagnosis not present

## 2018-03-16 DIAGNOSIS — R109 Unspecified abdominal pain: Secondary | ICD-10-CM | POA: Diagnosis present

## 2018-03-16 DIAGNOSIS — Z3A33 33 weeks gestation of pregnancy: Secondary | ICD-10-CM | POA: Diagnosis not present

## 2018-03-16 DIAGNOSIS — N200 Calculus of kidney: Secondary | ICD-10-CM | POA: Diagnosis not present

## 2018-03-16 DIAGNOSIS — Z331 Pregnant state, incidental: Secondary | ICD-10-CM | POA: Diagnosis not present

## 2018-03-16 DIAGNOSIS — N132 Hydronephrosis with renal and ureteral calculous obstruction: Secondary | ICD-10-CM | POA: Diagnosis not present

## 2018-03-16 DIAGNOSIS — Z87891 Personal history of nicotine dependence: Secondary | ICD-10-CM | POA: Diagnosis not present

## 2018-03-16 DIAGNOSIS — O26833 Pregnancy related renal disease, third trimester: Secondary | ICD-10-CM | POA: Diagnosis not present

## 2018-03-16 DIAGNOSIS — N1330 Unspecified hydronephrosis: Secondary | ICD-10-CM | POA: Diagnosis not present

## 2018-03-16 DIAGNOSIS — O99213 Obesity complicating pregnancy, third trimester: Secondary | ICD-10-CM | POA: Diagnosis present

## 2018-03-16 HISTORY — PX: CYSTOSCOPY W/ URETERAL STENT PLACEMENT: SHX1429

## 2018-03-16 LAB — BASIC METABOLIC PANEL
ANION GAP: 10 (ref 5–15)
BUN: 19 mg/dL (ref 6–20)
CALCIUM: 7.6 mg/dL — AB (ref 8.9–10.3)
CO2: 18 mmol/L — ABNORMAL LOW (ref 22–32)
Chloride: 106 mmol/L (ref 98–111)
Creatinine, Ser: 2.55 mg/dL — ABNORMAL HIGH (ref 0.44–1.00)
GFR, EST AFRICAN AMERICAN: 28 mL/min — AB (ref >60)
GFR, EST NON AFRICAN AMERICAN: 24 mL/min — AB (ref >60)
GLUCOSE: 97 mg/dL (ref 70–99)
POTASSIUM: 4.3 mmol/L (ref 3.5–5.1)
SODIUM: 134 mmol/L — AB (ref 135–145)

## 2018-03-16 LAB — CBC WITH DIFFERENTIAL/PLATELET
BASOS ABS: 0 10*3/uL (ref 0.0–0.1)
BASOS PCT: 0 %
Eosinophils Absolute: 0.2 10*3/uL (ref 0.0–0.7)
Eosinophils Relative: 1 %
HEMATOCRIT: 29.8 % — AB (ref 36.0–46.0)
HEMOGLOBIN: 9.9 g/dL — AB (ref 12.0–15.0)
LYMPHS PCT: 15 %
Lymphs Abs: 2 10*3/uL (ref 0.7–4.0)
MCH: 29.5 pg (ref 26.0–34.0)
MCHC: 33.2 g/dL (ref 30.0–36.0)
MCV: 88.7 fL (ref 78.0–100.0)
MONO ABS: 0.4 10*3/uL (ref 0.1–1.0)
Monocytes Relative: 3 %
NEUTROS ABS: 10.5 10*3/uL — AB (ref 1.7–7.7)
Neutrophils Relative %: 81 %
Platelets: 262 10*3/uL (ref 150–400)
RBC: 3.36 MIL/uL — AB (ref 3.87–5.11)
RDW: 14.4 % (ref 11.5–15.5)
WBC: 13.1 10*3/uL — ABNORMAL HIGH (ref 4.0–10.5)

## 2018-03-16 LAB — CULTURE, OB URINE: Culture: NO GROWTH

## 2018-03-16 SURGERY — CYSTOSCOPY, WITH RETROGRADE PYELOGRAM AND URETERAL STENT INSERTION
Anesthesia: Spinal | Site: Urethra | Laterality: Bilateral

## 2018-03-16 MED ORDER — LACTATED RINGERS IV SOLN: INTRAVENOUS | Status: DC

## 2018-03-16 MED ORDER — FENTANYL CITRATE (PF) 100 MCG/2ML IJ SOLN: 25.0000 ug | INTRAMUSCULAR | Status: DC | PRN

## 2018-03-16 MED ORDER — FENTANYL CITRATE (PF) 100 MCG/2ML IJ SOLN
100.0000 ug | Freq: Once | INTRAMUSCULAR | Status: AC
  Administered 2018-03-16: 100 ug via INTRAVENOUS
  Filled 2018-03-16: qty 2

## 2018-03-16 MED ORDER — CEFAZOLIN SODIUM-DEXTROSE 2-4 GM/100ML-% IV SOLN
2.0000 g | INTRAVENOUS | Status: AC
  Administered 2018-03-16: 2 g via INTRAVENOUS
  Filled 2018-03-16: qty 100

## 2018-03-16 MED ORDER — CEFAZOLIN SODIUM-DEXTROSE 2-4 GM/100ML-% IV SOLN: 2.0000 g | INTRAVENOUS | Status: DC

## 2018-03-16 MED ORDER — LACTATED RINGERS IV SOLN
INTRAVENOUS | Status: DC | PRN
  Administered 2018-03-16: 17:00:00 via INTRAVENOUS

## 2018-03-16 MED ORDER — HYDROMORPHONE HCL 1 MG/ML IJ SOLN
1.0000 mg | INTRAMUSCULAR | Status: AC | PRN
  Administered 2018-03-16 (×3): 1 mg via INTRAVENOUS
  Filled 2018-03-16 (×3): qty 1

## 2018-03-16 MED ORDER — ONDANSETRON HCL 4 MG/2ML IJ SOLN
INTRAMUSCULAR | Status: DC | PRN
  Administered 2018-03-16: 4 mg via INTRAVENOUS

## 2018-03-16 MED ORDER — KCL IN DEXTROSE-NACL 20-5-0.45 MEQ/L-%-% IV SOLN
INTRAVENOUS | Status: DC
  Administered 2018-03-16 – 2018-03-17 (×2): via INTRAVENOUS
  Filled 2018-03-16 (×3): qty 1000

## 2018-03-16 MED ORDER — ONDANSETRON HCL 4 MG/2ML IJ SOLN
INTRAMUSCULAR | Status: AC
  Filled 2018-03-16: qty 2

## 2018-03-16 SURGICAL SUPPLY — 12 items
DRAPE HYSTEROSCOPY (DRAPE) ×1 IMPLANT
GLOVE BIOGEL M STRL SZ7.5 (GLOVE) ×1 IMPLANT
GLOVE INDICATOR 7.0 STRL GRN (GLOVE) ×1 IMPLANT
GLOVE SKINSENSE NS SZ7.0 (GLOVE) ×2
GLOVE SKINSENSE STRL SZ7.0 (GLOVE) IMPLANT
GOWN STRL REUS W/TWL MED LVL3 (GOWN DISPOSABLE) ×3 IMPLANT
GUIDEWIRE ANG ZIPWIRE 038X150 (WIRE) ×1 IMPLANT
PACK CYSTOSCOPY (CUSTOM PROCEDURE TRAY) ×1 IMPLANT
STENT URET 6FRX26 CONTOUR (STENTS) ×2 IMPLANT
TOWEL OR 17X24 6PK STRL BLUE (TOWEL DISPOSABLE) ×2 IMPLANT
TRAY FOLEY W/BAG SLVR 16FR (SET/KITS/TRAYS/PACK) ×2
TRAY FOLEY W/BAG SLVR 16FR ST (SET/KITS/TRAYS/PACK) IMPLANT

## 2018-03-16 NOTE — Discharge Instructions (Signed)
Ureteral Stent Implantation, Care After Refer to this sheet in the next few weeks. These instructions provide you with information about caring for yourself after your procedure. Your health care provider may also give you more specific instructions. Your treatment has been planned according to current medical practices, but problems sometimes occur. Call your health care provider if you have any problems or questions after your procedure.  Be sure to keep follow-up with Dr. Junious Silk to plan removal of stents/stone.  What can I expect after the procedure? After the procedure, it is common to have:  Nausea.  Mild pain when you urinate. You may feel this pain in your lower back or lower abdomen. Pain should stop within a few minutes after you urinate. This may last for up to 1 week.  A small amount of blood in your urine for several days.  Follow these instructions at home:  Medicines  Take over-the-counter and prescription medicines only as told by your health care provider.  If you were prescribed an antibiotic medicine, take it as told by your health care provider. Do not stop taking the antibiotic even if you start to feel better.  Do not drive for 24 hours if you received a sedative.  Do not drive or operate heavy machinery while taking prescription pain medicines. Activity  Return to your normal activities as told by your health care provider. Ask your health care provider what activities are safe for you.  Do not lift anything that is heavier than 10 lb (4.5 kg). Follow this limit for 1 week after your procedure, or for as long as told by your health care provider. General instructions  Watch for any blood in your urine. Call your health care provider if the amount of blood in your urine increases.  If you have a catheter: ? Follow instructions from your health care provider about taking care of your catheter and collection bag. ? Do not take baths, swim, or use a hot tub  until your health care provider approves.  Drink enough fluid to keep your urine clear or pale yellow.  Keep all follow-up visits as told by your health care provider. This is important. Contact a health care provider if:  You have pain that gets worse or does not get better with medicine, especially pain when you urinate.  You have difficulty urinating.  You feel nauseous or you vomit repeatedly during a period of more than 2 days after the procedure. Get help right away if:  Your urine is dark red or has blood clots in it.  You are leaking urine (have incontinence).  The end of the stent comes out of your urethra.  You cannot urinate.  You have sudden, sharp, or severe pain in your abdomen or lower back.  You have a fever. This information is not intended to replace advice given to you by your health care provider. Make sure you discuss any questions you have with your health care provider. Document Released: 03/11/2013 Document Revised: 12/15/2015 Document Reviewed: 01/21/2015 Elsevier Interactive Patient Education  [employer].

## 2018-03-16 NOTE — Anesthesia Postprocedure Evaluation (Signed)
Anesthesia Post Note  Patient: Museum/gallery conservator Melody Stewart  Procedure(s) Performed: CYSTOSCOPY WITH RETROGRADE PYELOGRAM/URETERAL STENT PLACEMENT (Bilateral Urethra)     Patient location during evaluation: PACU Anesthesia Type: Spinal Level of consciousness: awake and alert Pain management: pain level controlled Vital Signs Assessment: post-procedure vital signs reviewed and stable Respiratory status: spontaneous breathing and respiratory function stable Cardiovascular status: blood pressure returned to baseline and stable Postop Assessment: no headache, no backache, spinal receding and no apparent nausea or vomiting Anesthetic complications: no    Last Vitals:  Vitals:   03/16/18 1830 03/16/18 1845  BP: 122/77   Pulse: 89 92  Resp: 17 20  Temp:    SpO2: 93% 96%    Last Pain:  Vitals:   03/16/18 1800  TempSrc:   PainSc: 0-No pain   Pain Goal: Patients Stated Pain Goal: 2 (03/16/18 1117)               Melody Stewart

## 2018-03-16 NOTE — Progress Notes (Signed)
Daily Antepartum Note  Admission Date: 03/15/2018 Current Date: 03/16/2018 7:27 AM  Melody Stewart is a 32 y.o. G1P0000 @ [redacted]w[redacted]d, HD#2, admitted for left sided stone and pain, ?pyelo.  Pregnancy complicated by: Patient Active Problem List   Diagnosis Date Noted  . Kidney stone complicating pregnancy, third trimester 03/15/2018  . AKI (acute kidney injury) ([place]) 03/15/2018  . Tinea corporis 02/19/2018  . Kidney stone complicating pregnancy, second trimester 01/27/2018  . Persistent UTI during pregnancy 09/30/2017  . Supervision of normal first pregnancy 09/25/2017  . Abnormal Pap smear of cervix 05/25/2016  . Anxiety and depression 09/14/2015    Overnight/24hr events:  PO meds not working and needing IV o/n  Subjective:  No fevers, chills. Some nausea w/o vomiting taking PO liquids and some solids fine. Still feels like it's hard to void completely. No OB s/s.   Objective:    Current Vital Signs 24h Vital Sign Ranges  T 98 F (36.7 C) Temp  Avg: 98.3 F (36.8 C)  Min: 97.4 F (36.3 C)  Max: 98.9 F (37.2 C)  BP (!) 115/59 BP  Min: 110/58  Max: 126/71  HR 95 Pulse  Avg: 90  Min: 82  Max: 95  RR 17 Resp  Avg: 17.5  Min: 16  Max: 19  SaO2 98 % Room Air SpO2  Avg: 99 %  Min: 98 %  Max: 100 %       24 Hour I/O Current Shift I/O  Time Ins Outs 08/24 0701 - 08/25 0700 In: 220 [P.O.:220] Out: 675 [Urine:675] No intake/output data recorded.   Patient Vitals for the past 24 hrs:  BP Temp Temp src Pulse Resp SpO2 Height Weight  03/16/18 0616 (!) 115/59 98 F (36.7 C) Oral 95 17 98 % - -  03/16/18 0026 (!) 110/58 98.1 F (36.7 C) Oral 94 19 98 % - -  03/15/18 2000 126/71 98.9 F (37.2 C) Oral 92 18 99 % - -  03/15/18 1652 122/74 98.9 F (37.2 C) Oral 82 16 100 % - -  03/15/18 1214 121/70 98.7 F (37.1 C) Oral 83 16 99 % 5\' 6"  (1.676 m) 113.4 kg  03/15/18 1124 - 97.9 F (36.6 C) Oral - - - - -  03/15/18 0175 124/70 (!) 97.4 F (36.3 C) Oral 94 19 100 % - 113.4 kg     Physical exam: General: Well nourished, well developed female in no acute distress. Abdomen: gravid nttp Cardiovascular: S1, S2 normal, no murmur, rub or gallop, regular rate and rhythm Respiratory: CTAB Extremities: no clubbing, cyanosis or edema Skin: Warm and dry.   Medications: Current Facility-Administered Medications  Medication Dose Route Frequency Provider Last Rate Last Dose  . acetaminophen (TYLENOL) tablet 650 mg  650 mg Oral Q4H PRN Jorje Guild, NP      . calcium carbonate (TUMS - dosed in mg elemental calcium) chewable tablet 400 mg of elemental calcium  2 tablet Oral Q4H PRN Jorje Guild, NP      . cefTRIAXone (ROCEPHIN) 2 g in sodium chloride 0.9 % 100 mL IVPB  2 g Intravenous Q24H Jorje Guild, NP 200 mL/hr at 03/15/18 1225 2 g at 03/15/18 1225  . docusate sodium (COLACE) capsule 100 mg  100 mg Oral BID PRN Aletha Halim, MD      . HYDROmorphone (DILAUDID) tablet 2-4 mg  2-4 mg Oral Q4H PRN Aletha Halim, MD   2 mg at 03/15/18 1515  . ondansetron (ZOFRAN-ODT) disintegrating tablet 4 mg  4  mg Oral Q6H PRN Jorje Guild, NP   4 mg at 03/15/18 1315  . prenatal multivitamin tablet 1 tablet  1 tablet Oral Q1200 Jorje Guild, NP      . promethazine (PHENERGAN) injection 12.5-25 mg  12.5-25 mg Intravenous Q6H PRN Aletha Halim, MD   12.5 mg at 03/16/18 0032    Labs:  Recent Labs  Lab 03/15/18 0910 03/16/18 0608  WBC 14.2* 13.1*  HGB 11.3* 9.9*  HCT 33.6* 29.8*  PLT 326 262    Recent Labs  Lab 03/15/18 1037 03/16/18 0608  NA 135 134*  K 4.4 4.3  CL 106 106  CO2 18* 18*  BUN 15 19  CREATININE 1.28* PENDING  CALCIUM 8.9 7.6*  PROT 6.3*  --   BILITOT 0.2*  --   ALKPHOS 79  --   ALT 12  --   AST 16  --   GLUCOSE 98 97   Pending: UCx.   Radiology: no new imaging  Assessment & Plan:  Pt stable to somewhat worse *Pregnancy: fetal status reassuring. Continue with daily non stress tests *Renal: continue rocephin. F/u UCx. F/u pending  labs and touch base with urology today given s/s seem worse *Preterm: no issues *PPx: SCDs, OOB ad lib *Analgesia: I did tell her that until the stone passes she won't be completely asymptomatic but her s/s do seem somewhat worse than on admission.  *FEN/GI:regular diet, SLIV *Dispo: pending improvement in s/s.   Durene Romans MD Attending Center for [facility])

## 2018-03-16 NOTE — Consult Note (Signed)
Consult: bilateral hydronephrosis, AKI, left proximal stone Requested by: Dr. Tania Ade  History of Present Illness: 32 yo at [redacted] weeks pregnant with persistent left flank pain. Also she has AKI with Cr going from .48 - 1.28 - 2.55. [place] 7/8 with poss LLP stone and right hydro. [place] yesterday with left prox stone and hydro and continued right hydro - both moderate. Pt notes decreased UOP over past two days (small volume voids). UA with 11-20 rbc and 21-50 wbc, many bac. She's been on nightly keflex supression and now on rocephin. Urine cx neg and neg GBS.   She denies h/o stones.   Past Medical History:  Diagnosis Date  . Anxiety and depression 09/14/2015  . Asthma   . BV (bacterial vaginosis) 02/24/2016  . Colitis   . Fibroadenoma of right breast   . GERD (gastroesophageal reflux disease)   . IBS (irritable bowel syndrome)   . Screening for STD (sexually transmitted disease) 09/14/2015  . UTI (urinary tract infection)   . Vaginal itching 09/14/2015   Past Surgical History:  Procedure Laterality Date  . KNEE SURGERY    . TONSILLECTOMY    . WISDOM TOOTH EXTRACTION      Home Medications:  Medications Prior to Admission  Medication Sig Dispense Refill Last Dose  . acetaminophen (TYLENOL) 325 MG tablet Take 650 mg by mouth every 6 (six) hours as needed for moderate pain.   03/14/2018 at Unknown time  . cephALEXin (KEFLEX) 500 MG capsule Take 1 capsule (500 mg total) by mouth at bedtime. 30 capsule 6 03/14/2018 at Unknown time  . Docosahexaenoic Acid (DHA PO) Take 1 tablet by mouth daily.    03/14/2018 at Unknown time  . HYDROmorphone (DILAUDID) 2 MG tablet Take 1 mg by mouth every 6 (six) hours as needed for severe pain.   03/14/2018 at Unknown time  . Prenatal Vit-Fe Fumarate-FA (PRENATAL VITAMIN PO) Take 1 tablet by mouth daily.    03/14/2018 at Unknown time  . Probiotic Product (PROBIOTIC DAILY PO) Take 1 tablet by mouth daily.   03/14/2018 at Unknown time  . VENTOLIN HFA 108 (90 Base) MCG/ACT  inhaler Inhale 2 puffs into the lungs every 6 (six) hours as needed for wheezing.    Past Month at Unknown time  . nystatin-triamcinolone ointment (MYCOLOG) Apply 1 application topically 2 (two) times daily. To affected area. (Patient not taking: Reported on 03/05/2018) 30 g prn Not Taking  . phenazopyridine (PYRIDIUM) 95 MG tablet Take 1 tablet (95 mg total) by mouth 3 (three) times daily as needed for pain. (Patient not taking: Reported on 12/04/2017) 10 tablet 0 Not Taking  . promethazine (PHENERGAN) 25 MG suppository Place 1 suppository (25 mg total) rectally every 6 (six) hours as needed for nausea or vomiting. MAY ALSO BE USED VAGINALLY (Patient not taking: Reported on 01/29/2018) 12 each 0 Not Taking  . tamsulosin (FLOMAX) 0.4 MG CAPS capsule Take 1 capsule (0.4 mg total) by mouth daily after breakfast. (Patient not taking: Reported on 02/19/2018) 30 capsule 0 Not Taking   Allergies:  Allergies  Allergen Reactions  . Hydrocodone Other (See Comments)    Make her sick and constipated.    Family History  Problem Relation Age of Onset  . Hypertension Mother   . Other Mother        mother had child hydrocephic-died  . Hypertension Father   . Heart disease Paternal Grandfather   . Stroke Paternal Grandfather   . Thyroid disease Maternal Grandmother   .  Heart disease Maternal Grandmother   . Cancer Paternal Grandmother        breast  . Thyroid disease Maternal Aunt   . Cancer Paternal Aunt        breast   Social History:  reports that she has quit smoking. Her smoking use included cigarettes. She has never used smokeless tobacco. She reports that she does not drink alcohol or use drugs.  ROS: A complete review of systems was performed.  All systems are negative except for pertinent findings as noted. Review of Systems  All other systems reviewed and are negative.    Physical Exam:  Vital signs in last 24 hours: Temp:  [98 F (36.7 C)-98.9 F (37.2 C)] 98.8 F (37.1 C) (08/25  1605) Pulse Rate:  [82-108] 103 (08/25 1605) Resp:  [16-19] 18 (08/25 1605) BP: (110-126)/(58-Melody) 110/58 (08/25 1605) SpO2:  [97 %-100 %] 98 % (08/25 1605) General:  Alert and oriented, No acute distress HEENT: Normocephalic, atraumatic Cardiovascular: Regular rate and rhythm Lungs: Regular rate and effort Abdomen: Soft, nontender, nondistended, no abdominal masses Back: No CVA tenderness Extremities: mild edema Neurologic: Grossly intact  Laboratory Data:  Results for orders placed or performed during the hospital encounter of 03/15/18 (from the past 24 hour(s))  Basic metabolic panel     Status: Abnormal   Collection Time: 03/16/18  6:08 AM  Result Value Ref Range   Sodium 134 (L) 135 - 145 mmol/L   Potassium 4.3 3.5 - 5.1 mmol/L   Chloride 106 98 - 111 mmol/L   CO2 18 (L) 22 - 32 mmol/L   Glucose, Bld 97 70 - 99 mg/dL   BUN 19 6 - 20 mg/dL   Creatinine, Ser 2.55 (H) 0.44 - 1.00 mg/dL   Calcium 7.6 (L) 8.9 - 10.3 mg/dL   GFR calc non Af Amer 24 (L) >60 mL/min   GFR calc Af Amer 28 (L) >60 mL/min   Anion gap 10 5 - 15  CBC with Differential/Platelet     Status: Abnormal   Collection Time: 03/16/18  6:08 AM  Result Value Ref Range   WBC 13.1 (H) 4.0 - 10.5 K/uL   RBC 3.36 (L) 3.87 - 5.11 MIL/uL   Hemoglobin 9.9 (L) 12.0 - 15.0 g/dL   HCT 29.8 (L) 36.0 - 46.0 %   MCV 88.7 78.0 - 100.0 fL   MCH 29.5 26.0 - 34.0 pg   MCHC 33.2 30.0 - 36.0 g/dL   RDW 14.4 11.5 - 15.5 %   Platelets 262 150 - 400 K/uL   Neutrophils Relative % 81 %   Neutro Abs 10.5 (H) 1.7 - 7.7 K/uL   Lymphocytes Relative 15 %   Lymphs Abs 2.0 0.7 - 4.0 K/uL   Monocytes Relative 3 %   Monocytes Absolute 0.4 0.1 - 1.0 K/uL   Eosinophils Relative 1 %   Eosinophils Absolute 0.2 0.0 - 0.7 K/uL   Basophils Relative 0 %   Basophils Absolute 0.0 0.0 - 0.1 K/uL   Recent Results (from the past 240 hour(s))  Culture, OB Urine     Status: None   Collection Time: 03/15/18  8:35 AM  Result Value Ref Range Status    Specimen Description   Final    OB CLEAN CATCH Performed at [facility], [address] [place], [place] [address]    Special Requests   Final    NONE Performed at [facility], [address], [place], [place] [address]    Culture  Final    NO GROWTH NO GROUP B STREP (S.AGALACTIAE) ISOLATED Performed at [facility] [address] [place], [place] [address]    Report Status 03/16/2018 FINAL  Final   Creatinine: Recent Labs    03/15/18 1037 03/16/18 0608  CREATININE 1.28* 2.55*    Impression/Assessment/plan:  -AKI - likely from bilateral hydronephrosis. Also decreased UOP a concern.   -bilateral hydronephrosis - likely from left renal stone on left and pregnancy on right but discussed ureters incompletely evaluated. I discussed with the patient the nature, potential benefits, risks and alternatives to cystoscopy, US guided poss RGP bilateral ureteral stent placement, including side effects of the proposed treatment, the likelihood of the patient achieving the goals of the procedure, and any potential problems that might occur during the procedure or recuperation. We discussed further eval with CT and MRI, but with bilateral hydro, decreased UOP and rising Cr she needs stents. We also discussed alternative such as perc nephrostomy tubes. We discussed risk of stent pain and UTI among others. Also risk of encrustation and plan to change stents in 4-6 weeks although she'll be near term. All questions answered. Patient elects to proceed.   Festus Aloe 03/16/2018, 4:32 PM

## 2018-03-16 NOTE — Op Note (Signed)
Preoperative diagnosis: Bilateral hydronephrosis, left proximal stone, acute kidney injury Postoperative diagnosis: Same  Procedure: Cystoscopy with bilateral ureteral stent placement under ultrasound guidance  Surgeon: Stephany Poorman  Anesthesia: Spinal  Indication for procedure: 32 year old white female who developed left flank pain and left hydronephrosis and likely left proximal stone who also had right hydronephrosis.  On a prior ultrasound of lower pole stone was noted on the left and when she developed flank pain it appeared the stone had dropped into the left proximal ureter.  She was noted to have decreased urine output over the weekend and climbing creatinine and was brought for bilateral ureteral stents.  Findings: On cystoscopy the urethra and bladder are unremarkable.  The wire and stents were visible in the collecting system under fluoroscopic guidance.  Description of procedure: After consent was obtained the patient brought to the operating room.  After adequate anesthesia she was placed in lithotomy position and prepped and draped in the usual sterile fashion.  A timeout was performed to confirm patient and procedure.  The cystoscope was passed per urethra and bladder inspected.  I was able to advance a Glidewire up the left ureteral orifice and it passed normally and met the usual resistance in the kidney.  I move the wire around and we could see it under ultrasonic imaging of the left kidney.  I then passed a 6 x 26 cm stent and remove the wire.  The stent was noted on the ultrasound in the kidney and a good coil was noted in the bladder.  After passing the wire, urine began to drain and also after deploying the stent urine began to drain through the stent holes.  Attention was then turned to the right ureteral orifice where it was cannulated with the Glidewire and advanced into the collecting system.  Again, I moved the wire back and forth and we could see it under ultrasound guidance.  I  then passed a 626 cm stent on the right and remove the wire with a good coil seen in the bladder and we are able to note the stent visible in the right kidney under ultrasonic guidance.  The stent also appeared to be draining urine.  The scope was removed and I placed a 16 French Foley to max drained the system.  Also the patient had a spinal anesthetic and we do anticipate a brisk diuresis.  She was taken to the recovery room in stable condition.  Complications: None  Blood loss: Minimal  Specimens: None  Drains: Bilateral 6 x 26 cm ureteral stents  Disposition: Patient stable to PACU  I discussed with the patient and her family postop again the follow-up plan for ureteroscopy after delivery.  In the recovery room, she already has about 500 cc of urine output.

## 2018-03-16 NOTE — Progress Notes (Signed)
Patient ID: Melody Stewart, female   DOB: 1985/09/17, 32 y.o.   MRN: [id number] Subjective: Interval History: sharp increase in serum creatinine which will require stent placement, discussed with Dr Junious Silk  Objective: Vital signs in last 24 hours: Temp:  [97.9 F (36.6 C)-98.9 F (37.2 C)] 98.9 F (37.2 C) (08/25 0753) Pulse Rate:  [82-95] 93 (08/25 0753) Resp:  [16-19] 18 (08/25 0753) BP: (110-126)/(58-74) 111/71 (08/25 0753) SpO2:  [98 %-100 %] 99 % (08/25 0753) Weight:  [113.4 kg] 113.4 kg (08/24 1214)  Intake/Output from previous day: 08/24 0701 - 08/25 0700 In: 220 [P.O.:220] Out: 675 [Urine:675] Intake/Output this shift: Total I/O In: -  Out: 100 [Urine:100]    Results for orders placed or performed during the hospital encounter of 03/15/18 (from the past 24 hour(s))  Comprehensive metabolic panel     Status: Abnormal   Collection Time: 03/15/18 10:37 AM  Result Value Ref Range   Sodium 135 135 - 145 mmol/L   Potassium 4.4 3.5 - 5.1 mmol/L   Chloride 106 98 - 111 mmol/L   CO2 18 (L) 22 - 32 mmol/L   Glucose, Bld 98 70 - 99 mg/dL   BUN 15 6 - 20 mg/dL   Creatinine, Ser 1.28 (H) 0.44 - 1.00 mg/dL   Calcium 8.9 8.9 - 10.3 mg/dL   Total Protein 6.3 (L) 6.5 - 8.1 g/dL   Albumin 2.9 (L) 3.5 - 5.0 g/dL   AST 16 15 - 41 U/L   ALT 12 0 - 44 U/L   Alkaline Phosphatase 79 38 - 126 U/L   Total Bilirubin 0.2 (L) 0.3 - 1.2 mg/dL   GFR calc non Af Amer 54 (L) >60 mL/min   GFR calc Af Amer >60 >60 mL/min   Anion gap 11 5 - 15  Basic metabolic panel     Status: Abnormal   Collection Time: 03/16/18  6:08 AM  Result Value Ref Range   Sodium 134 (L) 135 - 145 mmol/L   Potassium 4.3 3.5 - 5.1 mmol/L   Chloride 106 98 - 111 mmol/L   CO2 18 (L) 22 - 32 mmol/L   Glucose, Bld 97 70 - 99 mg/dL   BUN 19 6 - 20 mg/dL   Creatinine, Ser 2.55 (H) 0.44 - 1.00 mg/dL   Calcium 7.6 (L) 8.9 - 10.3 mg/dL   GFR calc non Af Amer 24 (L) >60 mL/min   GFR calc Af Amer 28 (L) >60 mL/min   Anion gap 10 5 - 15  CBC with Differential/Platelet     Status: Abnormal   Collection Time: 03/16/18  6:08 AM  Result Value Ref Range   WBC 13.1 (H) 4.0 - 10.5 K/uL   RBC 3.36 (L) 3.87 - 5.11 MIL/uL   Hemoglobin 9.9 (L) 12.0 - 15.0 g/dL   HCT 29.8 (L) 36.0 - 46.0 %   MCV 88.7 78.0 - 100.0 fL   MCH 29.5 26.0 - 34.0 pg   MCHC 33.2 30.0 - 36.0 g/dL   RDW 14.4 11.5 - 15.5 %   Platelets 262 150 - 400 K/uL   Neutrophils Relative % 81 %   Neutro Abs 10.5 (H) 1.7 - 7.7 K/uL   Lymphocytes Relative 15 %   Lymphs Abs 2.0 0.7 - 4.0 K/uL   Monocytes Relative 3 %   Monocytes Absolute 0.4 0.1 - 1.0 K/uL   Eosinophils Relative 1 %   Eosinophils Absolute 0.2 0.0 - 0.7 K/uL   Basophils Relative 0 %  Basophils Absolute 0.0 0.0 - 0.1 K/uL    Studies/Results: US Renal  Result Date: 03/15/2018 CLINICAL DATA:  Pregnant female in the third trimester. Left flank pain. Known renal stone. EXAM: RENAL / URINARY TRACT ULTRASOUND COMPLETE COMPARISON:  03/11/2018 renal sonogram FINDINGS: Right Kidney: Length: 12.5 cm. Normal right renal parenchymal thickness and echogenicity. Moderate right hydroureteronephrosis is not appreciably changed. No right renal mass. Left Kidney: Length: 12.7 cm. Normal left renal parenchymal thickness and echogenicity. Moderate left hydroureteronephrosis is stable. No change in position of obstructing 1 cm stone in the proximal left ureter. No left renal mass. Bladder: Bladder completely collapsed and not well evaluated on this scan. IMPRESSION: 1. Stable position of obstructing 1 cm proximal left ureteral stone. Stable moderate left hydroureteronephrosis. 2. Stable moderate right hydroureteronephrosis. 3. Collapsed bladder, not well evaluated. Electronically Signed   By: Ilona Sorrel M.D.   On: 03/15/2018 10:30    Scheduled Meds: . prenatal multivitamin  1 tablet Oral Q1200   Continuous Infusions: . cefTRIAXone (ROCEPHIN)  IV 2 g (03/15/18 1225)   PRN Meds:acetaminophen, calcium  carbonate, docusate sodium, HYDROmorphone (DILAUDID) injection, HYDROmorphone, ondansetron, promethazine  Assessment/Plan: [redacted]w[redacted]d Estimated Date of Delivery: 05/02/18 with bilateral hydronephrosis and an obstructing left ureteral calculus resulting in dramatic rise in serum creatinine necessitating stent placement, agree with bilateral  Pt now NPO and pt is posted for bilateral stent placement, orders placed   LOS: 0 days   Florian Buff

## 2018-03-16 NOTE — Anesthesia Preprocedure Evaluation (Signed)
Anesthesia Evaluation  Patient identified by MRN, date of birth, ID band Patient awake    Reviewed: Allergy & Precautions, NPO status , Patient's Chart, lab work & pertinent test results  Airway Mallampati: II  TM Distance: >3 FB Neck ROM: Full    Dental no notable dental hx.    Pulmonary neg pulmonary ROS, former smoker,    Pulmonary exam normal breath sounds clear to auscultation       Cardiovascular negative cardio ROS Normal cardiovascular exam Rhythm:Regular Rate:Normal     Neuro/Psych negative neurological ROS  negative psych ROS   GI/Hepatic negative GI ROS, Neg liver ROS,   Endo/Other  Morbid obesity  Renal/GU negative Renal ROS  negative genitourinary   Musculoskeletal negative musculoskeletal ROS (+)   Abdominal   Peds negative pediatric ROS (+)  Hematology negative hematology ROS (+)   Anesthesia Other Findings   Reproductive/Obstetrics (+) Pregnancy                             Anesthesia Physical Anesthesia Plan  ASA: II  Anesthesia Plan: Spinal   Post-op Pain Management:    Induction:   PONV Risk Score and Plan: 2 and Ondansetron  Airway Management Planned: Natural Airway  Additional Equipment:   Intra-op Plan:   Post-operative Plan:   Informed Consent: I have reviewed the patients History and Physical, chart, labs and discussed the procedure including the risks, benefits and alternatives for the proposed anesthesia with the patient or authorized representative who has indicated his/her understanding and acceptance.   Dental advisory given  Plan Discussed with:   Anesthesia Plan Comments:         Anesthesia Quick Evaluation

## 2018-03-16 NOTE — Progress Notes (Signed)
Pt transferred to [facility] without any post-op orders. RN called PACU, PACU nurse to contact Dr. Junious Silk.

## 2018-03-16 NOTE — Transfer of Care (Signed)
Immediate Anesthesia Transfer of Care Note  Patient: Melody Stewart  Procedure(s) Performed: CYSTOSCOPY WITH RETROGRADE PYELOGRAM/URETERAL STENT PLACEMENT (Bilateral Urethra)  Patient Location: PACU  Anesthesia Type:Spinal  Level of Consciousness: awake, alert  and oriented  Airway & Oxygen Therapy: Patient Spontanous Breathing  Post-op Assessment: Report given to RN and Post -op Vital signs reviewed and stable  Post vital signs: Reviewed and stable  Last Vitals:  Vitals Value Taken Time  BP    Temp    Pulse    Resp    SpO2      Last Pain:  Vitals:   03/16/18 1605  TempSrc: Oral  PainSc:       Patients Stated Pain Goal: 2 (83/47/58 3074)  Complications: No apparent anesthesia complications

## 2018-03-17 ENCOUNTER — Encounter (HOSPITAL_COMMUNITY): Payer: Self-pay | Admitting: Urology

## 2018-03-17 LAB — BASIC METABOLIC PANEL
Anion gap: 11 (ref 5–15)
BUN: 19 mg/dL (ref 6–20)
CO2: 18 mmol/L — ABNORMAL LOW (ref 22–32)
Calcium: 7.8 mg/dL — ABNORMAL LOW (ref 8.9–10.3)
Chloride: 107 mmol/L (ref 98–111)
Creatinine, Ser: 1.93 mg/dL — ABNORMAL HIGH (ref 0.44–1.00)
GFR calc Af Amer: 38 mL/min — ABNORMAL LOW (ref >60)
GFR calc non Af Amer: 33 mL/min — ABNORMAL LOW (ref >60)
Glucose, Bld: 108 mg/dL — ABNORMAL HIGH (ref 70–99)
POTASSIUM: 4 mmol/L (ref 3.5–5.1)
SODIUM: 136 mmol/L (ref 135–145)

## 2018-03-17 MED ORDER — HYDROMORPHONE HCL 2 MG PO TABS: 2.0000 mg | ORAL_TABLET | ORAL | 0 refills | Status: DC | PRN

## 2018-03-17 NOTE — Progress Notes (Signed)
1 Day Post-Op Subjective: Patient reports she feels well. Diuresed over 5 L.   Objective: Vital signs in last 24 hours: Temp:  [97.7 F (36.5 C)-99 F (37.2 C)] 98.7 F (37.1 C) (08/26 0748) Pulse Rate:  [79-108] 84 (08/26 0748) Resp:  [17-20] 18 (08/26 0748) BP: (100-128)/(55-77) 111/72 (08/26 0748) SpO2:  [93 %-99 %] 98 % (08/26 0748)  Intake/Output from previous day: 08/25 0701 - 08/26 0700 In: 1100 [I.V.:1100] Out: 4900 [Urine:4900] Intake/Output this shift: No intake/output data recorded.   Physical Exam:  Walking the halls  Urine light pink   Lab Results: Recent Labs    03/15/18 0910 03/16/18 0608  HGB 11.3* 9.9*  HCT 33.6* 29.8*   BMET Recent Labs    03/16/18 0608 03/17/18 0530  NA 134* 136  K 4.3 4.0  CL 106 107  CO2 18* 18*  GLUCOSE 97 108*  BUN 19 19  CREATININE 2.55* 1.93*  CALCIUM 7.6* 7.8*   No results for input(s): LABPT, INR in the last 72 hours. No results for input(s): LABURIN in the last 72 hours. Results for orders placed or performed during the hospital encounter of 03/15/18  Culture, OB Urine     Status: None   Collection Time: 03/15/18  8:35 AM  Result Value Ref Range Status   Specimen Description   Final    OB CLEAN CATCH Performed at [facility], [address] [place], [place] [address]    Special Requests   Final    NONE Performed at [facility], [address] [place], [place] [address]    Culture   Final    NO GROWTH NO GROUP B STREP (S.AGALACTIAE) ISOLATED Performed at [facility], [place] [address], [place],  [address]    Report Status 03/16/2018 FINAL  Final    Studies/Results: [place] Intraoperative  Result Date: 03/16/2018 CLINICAL DATA:  Ultrasound was provided for use by the ordering physician, and a technical charge was applied by the performing facility.  No radiologist interpretation/professional services rendered.   US Renal  Result Date: 03/15/2018 CLINICAL DATA:  Pregnant female in  the third trimester. Left flank pain. Known renal stone. EXAM: RENAL / URINARY TRACT ULTRASOUND COMPLETE COMPARISON:  03/11/2018 renal sonogram FINDINGS: Right Kidney: Length: 12.5 cm. Normal right renal parenchymal thickness and echogenicity. Moderate right hydroureteronephrosis is not appreciably changed. No right renal mass. Left Kidney: Length: 12.7 cm. Normal left renal parenchymal thickness and echogenicity. Moderate left hydroureteronephrosis is stable. No change in position of obstructing 1 cm stone in the proximal left ureter. No left renal mass. Bladder: Bladder completely collapsed and not well evaluated on this scan. IMPRESSION: 1. Stable position of obstructing 1 cm proximal left ureteral stone. Stable moderate left hydroureteronephrosis. 2. Stable moderate right hydroureteronephrosis. 3. Collapsed bladder, not well evaluated. Electronically Signed   By: Ilona Sorrel M.D.   On: 03/15/2018 10:30    Assessment/Plan:  Left proximal stone, bilateral hydro, AKI - s/p stents 8/25. Good UOP and Cr improving. OK to d/c foley. I would continue a nightly suppressive cephalexin until delivery. I'll see her back in 2-3 weeks to plan ureteroscopy in about 6 weeks.    LOS: 1 day   Festus Aloe 03/17/2018, 10:09 AM

## 2018-03-17 NOTE — Discharge Summary (Signed)
Physician Discharge Summary  Patient ID: Melody Stewart MRN: [id number] DOB/AGE: 33-May-1987 33 y.o.  Admit date: 03/15/2018 Discharge date: 03/17/2018  Admission Diagnoses: Obstructing ureteral calculus AKI due to obstruction  Discharge Diagnoses:  Active Problems:   Kidney stone complicating pregnancy, third trimester   AKI (acute kidney injury) [facility])   Discharged Condition: good  Hospital Course: presented with increaded pain from known stone Creatinine bumped from 1.28 to 2.55 Consulted Dr Junious Silk who place bilateral stents Pt feels much better and creatinine is dropping post stents x 2   Consults: urology  Significant Diagnostic Studies: labs:   Treatments: surgery: ureteral stent placement  Discharge Exam: Blood pressure 111/72, pulse 84, temperature 98.7 F (37.1 C), temperature source Oral, resp. rate 18, height 5\' 6"  (1.676 m), weight 113.4 kg, last menstrual period 07/20/2017, SpO2 98 %. General appearance: alert, cooperative and no distress Back: symmetric, no curvature. ROM normal. No CVA tenderness. GI: soft, non-tender; bowel sounds normal; no masses,  no organomegaly  Disposition: Discharge disposition: 01-Home or Self Care       Discharge Instructions    Call MD for:  persistant nausea and vomiting   Complete by:  As directed    Call MD for:  severe uncontrolled pain   Complete by:  As directed    Call MD for:  temperature >100.4   Complete by:  As directed    Diet - low sodium heart healthy   Complete by:  As directed    Increase activity slowly   Complete by:  As directed       Follow-up Information    Call Festus Aloe, MD.   Specialty:  Urology Contact information: [place] [address] [address] Follow up on 03/19/2018.   Contact information: [place] [address] [phone]          Signed: Florian Buff 03/17/2018, 10:12 AM

## 2018-03-17 NOTE — Progress Notes (Signed)
Rn reviewed DC instructions with patient.  Discussed medication changes and postoperative instructions.  IV removed

## 2018-03-19 ENCOUNTER — Ambulatory Visit (INDEPENDENT_AMBULATORY_CARE_PROVIDER_SITE_OTHER): Payer: Federal, State, Local not specified - PPO | Admitting: Advanced Practice Midwife

## 2018-03-19 VITALS — BP 125/76 | HR 107 | Wt 246.0 lb

## 2018-03-19 DIAGNOSIS — R8271 Bacteriuria: Secondary | ICD-10-CM

## 2018-03-19 DIAGNOSIS — Z3403 Encounter for supervision of normal first pregnancy, third trimester: Secondary | ICD-10-CM

## 2018-03-19 DIAGNOSIS — O9989 Other specified diseases and conditions complicating pregnancy, childbirth and the puerperium: Secondary | ICD-10-CM

## 2018-03-19 DIAGNOSIS — Z1389 Encounter for screening for other disorder: Secondary | ICD-10-CM

## 2018-03-19 DIAGNOSIS — O99891 Other specified diseases and conditions complicating pregnancy: Secondary | ICD-10-CM

## 2018-03-19 DIAGNOSIS — R1011 Right upper quadrant pain: Secondary | ICD-10-CM | POA: Diagnosis not present

## 2018-03-19 DIAGNOSIS — R3121 Asymptomatic microscopic hematuria: Secondary | ICD-10-CM

## 2018-03-19 DIAGNOSIS — Z331 Pregnant state, incidental: Secondary | ICD-10-CM

## 2018-03-19 DIAGNOSIS — Z3A33 33 weeks gestation of pregnancy: Secondary | ICD-10-CM

## 2018-03-19 LAB — POCT URINALYSIS DIPSTICK OB
GLUCOSE, UA: NEGATIVE
Ketones, UA: NEGATIVE
LEUKOCYTES UA: NEGATIVE
Nitrite, UA: NEGATIVE

## 2018-03-19 MED ORDER — PHENAZOPYRIDINE HCL 95 MG PO TABS: 95.0000 mg | ORAL_TABLET | Freq: Three times a day (TID) | ORAL | 0 refills | Status: DC | PRN

## 2018-03-19 NOTE — Patient Instructions (Signed)
Melody Stewart, I greatly value your feedback.  If you receive a survey following your visit with [place] today, we appreciate you taking the time to fill it out.  Thanks, [name], CNM   Call the office [phone]) or go to [facility] if:  You begin to have strong, frequent contractions  Your water breaks.  Sometimes it is a big gush of fluid, sometimes it is just a trickle that keeps getting your panties wet or running down your legs  You have vaginal bleeding.  It is normal to have a small amount of spotting if your cervix was checked.   You don't feel your baby moving like normal.  If you don't, get you something to eat and drink and lay down and focus on feeling your baby move.  You should feel at least 10 movements in 2 hours.  If you don't, you should call the office or go to [facility].    Tdap Vaccine  It is recommended that you get the Tdap vaccine during the third trimester of EACH pregnancy to help protect your baby from getting pertussis (whooping cough)  27-36 weeks is the BEST time to do this so that you can pass the protection on to your baby. During pregnancy is better than after pregnancy, but if you are unable to get it during pregnancy it will be offered at the hospital.   You will be offered this vaccine in the office after 27 weeks. If you do not have health insurance, you can get this vaccine at the health department or your family doctor  Everyone who will be around your baby should also be up-to-date on their vaccines. Adults (who are not pregnant) only need 1 dose of Tdap during adulthood.   Third Trimester of Pregnancy The third trimester is from week 29 through week 42, months 7 through 9. The third trimester is a time when the fetus is growing rapidly. At the end of the ninth month, the fetus is about 20 inches in length and weighs 6-10 pounds.  BODY CHANGES Your body goes through many changes during pregnancy. The changes vary from woman to  woman.   Your weight will continue to increase. You can expect to gain 25-35 pounds (11-16 kg) by the end of the pregnancy.  You may begin to get stretch marks on your hips, abdomen, and breasts.  You may urinate more often because the fetus is moving lower into your pelvis and pressing on your bladder.  You may develop or continue to have heartburn as a result of your pregnancy.  You may develop constipation because certain hormones are causing the muscles that push waste through your intestines to slow down.  You may develop hemorrhoids or swollen, bulging veins (varicose veins).  You may have pelvic pain because of the weight gain and pregnancy hormones relaxing your joints between the bones in your pelvis. Backaches may result from overexertion of the muscles supporting your posture.  You may have changes in your hair. These can include thickening of your hair, rapid growth, and changes in texture. Some women also have hair loss during or after pregnancy, or hair that feels dry or thin. Your hair will most likely return to normal after your baby is born.  Your breasts will continue to grow and be tender. A yellow discharge may leak from your breasts called colostrum.  Your belly button may stick out.  You may feel short of breath because of your expanding uterus.  You may notice the fetus "dropping," or moving lower in your abdomen.  You may have a bloody mucus discharge. This usually occurs a few days to a week before labor begins.  Your cervix becomes thin and soft (effaced) near your due date. WHAT TO EXPECT AT YOUR PRENATAL EXAMS  You will have prenatal exams every 2 weeks until week 36. Then, you will have weekly prenatal exams. During a routine prenatal visit:  You will be weighed to make sure you and the fetus are growing normally.  Your blood pressure is taken.  Your abdomen will be measured to track your baby's growth.  The fetal heartbeat will be listened  to.  Any test results from the previous visit will be discussed.  You may have a cervical check near your due date to see if you have effaced. At around 36 weeks, your caregiver will check your cervix. At the same time, your caregiver will also perform a test on the secretions of the vaginal tissue. This test is to determine if a type of bacteria, Group B streptococcus, is present. Your caregiver will explain this further. Your caregiver may ask you:  What your birth plan is.  How you are feeling.  If you are feeling the baby move.  If you have had any abnormal symptoms, such as leaking fluid, bleeding, severe headaches, or abdominal cramping.  If you have any questions. Other tests or screenings that may be performed during your third trimester include:  Blood tests that check for low iron levels (anemia).  Fetal testing to check the health, activity level, and growth of the fetus. Testing is done if you have certain medical conditions or if there are problems during the pregnancy. FALSE LABOR You may feel small, irregular contractions that eventually go away. These are called Braxton Hicks contractions, or false labor. Contractions may last for hours, days, or even weeks before true labor sets in. If contractions come at regular intervals, intensify, or become painful, it is best to be seen by your caregiver.  SIGNS OF LABOR   Menstrual-like cramps.  Contractions that are 5 minutes apart or less.  Contractions that start on the top of the uterus and spread down to the lower abdomen and back.  A sense of increased pelvic pressure or back pain.  A watery or bloody mucus discharge that comes from the vagina. If you have any of these signs before the 37th week of pregnancy, call your caregiver right away. You need to go to the hospital to get checked immediately. HOME CARE INSTRUCTIONS   Avoid all smoking, herbs, alcohol, and unprescribed drugs. These chemicals affect the  formation and growth of the baby.  Follow your caregiver's instructions regarding medicine use. There are medicines that are either safe or unsafe to take during pregnancy.  Exercise only as directed by your caregiver. Experiencing uterine cramps is a good sign to stop exercising.  Continue to eat regular, healthy meals.  Wear a good support bra for breast tenderness.  Do not use hot tubs, steam rooms, or saunas.  Wear your seat belt at all times when driving.  Avoid raw meat, uncooked cheese, cat litter boxes, and soil used by cats. These carry germs that can cause birth defects in the baby.  Take your prenatal vitamins.  Try taking a stool softener (if your caregiver approves) if you develop constipation. Eat more high-fiber foods, such as fresh vegetables or fruit and whole grains. Drink plenty of fluids to keep your urine  clear or pale yellow.  Take warm sitz baths to soothe any pain or discomfort caused by hemorrhoids. Use hemorrhoid cream if your caregiver approves.  If you develop varicose veins, wear support hose. Elevate your feet for 15 minutes, 3-4 times a day. Limit salt in your diet.  Avoid heavy lifting, wear low heal shoes, and practice good posture.  Rest a lot with your legs elevated if you have leg cramps or low back pain.  Visit your dentist if you have not gone during your pregnancy. Use a soft toothbrush to brush your teeth and be gentle when you floss.  A sexual relationship may be continued unless your caregiver directs you otherwise.  Do not travel far distances unless it is absolutely necessary and only with the approval of your caregiver.  Take prenatal classes to understand, practice, and ask questions about the labor and delivery.  Make a trial run to the hospital.  Pack your hospital bag.  Prepare the baby's nursery.  Continue to go to all your prenatal visits as directed by your caregiver. SEEK MEDICAL CARE IF:  You are unsure if you are in  labor or if your water has broken.  You have dizziness.  You have mild pelvic cramps, pelvic pressure, or nagging pain in your abdominal area.  You have persistent nausea, vomiting, or diarrhea.  You have a bad smelling vaginal discharge.  You have pain with urination. SEEK IMMEDIATE MEDICAL CARE IF:   You have a fever.  You are leaking fluid from your vagina.  You have spotting or bleeding from your vagina.  You have severe abdominal cramping or pain.  You have rapid weight loss or gain.  You have shortness of breath with chest pain.  You notice sudden or extreme swelling of your face, hands, ankles, feet, or legs.  You have not felt your baby move in over an hour.  You have severe headaches that do not go away with medicine.  You have vision changes. Document Released: 07/03/2001 Document Revised: 07/14/2013 Document Reviewed: 09/09/2012 [facility] Patient Information 2015 [place], [place]. This information is not intended to replace advice given to you by your health care provider. Make sure you discuss any questions you have with your health care provider.

## 2018-03-19 NOTE — Progress Notes (Signed)
  G1P0000 [redacted]w[redacted]d Estimated Date of Delivery: 05/02/18  Blood pressure 125/76, pulse (!) 107, weight 246 lb (111.6 kg), last menstrual period 07/20/2017.   BP weight and urine results all reviewed and noted.  Please refer to the obstetrical flow sheet for the fundal height and fetal heart rate documentation:  Patient reports good fetal movement, denies any bleeding and no rupture of membranes symptoms or regular contractions. Got 2 uretal stents placed last week (see note). Still feels kind of poorly, but not taking dilausdid and declines pyridium. .   Started having RUQ pain this am, mainly w/movement.  No N/V. Will check labs All questions were answered.   Physical Assessment:   Vitals:   03/19/18 1142  BP: 125/76  Pulse: (!) 107  Weight: 246 lb (111.6 kg)  Body mass index is 39.71 kg/m.        Physical Examination:   General appearance: Well appearing, and in no distress  Mental status: Alert, oriented to person, place, and time  Skin: Warm & dry  Cardiovascular: Normal heart rate noted  Respiratory: Normal respiratory effort, no distress  Abdomen: Soft, gravid, nontender  Pelvic: Cervical exam deferred         Extremities: Edema: Trace  Fetal Status:     Movement: Present    Results for orders placed or performed in visit on 03/19/18 (from the past 24 hour(s))  POC Urinalysis Dipstick OB   Collection Time: 03/19/18 11:51 AM  Result Value Ref Range   Color, UA     Clarity, UA     Glucose, UA Negative Negative   Bilirubin, UA     Ketones, UA neg    Spec Grav, UA     Blood, UA large    pH, UA     POC Protein UA Large (3+) (A) Negative, Trace   Urobilinogen, UA     Nitrite, UA neg    Leukocytes, UA Negative Negative   Appearance     Odor       Orders Placed This Encounter  Procedures  . Comprehensive metabolic panel  . POC Urinalysis Dipstick OB    Plan:  Continued routine obstetrical care,   Return in about 2 weeks (around 04/02/2018) for [place].

## 2018-03-20 ENCOUNTER — Telehealth: Payer: Self-pay | Admitting: Obstetrics & Gynecology

## 2018-03-20 ENCOUNTER — Other Ambulatory Visit: Payer: Self-pay | Admitting: Advanced Practice Midwife

## 2018-03-20 LAB — COMPREHENSIVE METABOLIC PANEL
ALT: 12 IU/L (ref 0–32)
AST: 9 IU/L (ref 0–40)
Albumin/Globulin Ratio: 1.4 (ref 1.2–2.2)
Albumin: 3.4 g/dL — ABNORMAL LOW (ref 3.5–5.5)
Alkaline Phosphatase: 94 IU/L (ref 39–117)
BUN/Creatinine Ratio: 17 (ref 9–23)
BUN: 13 mg/dL (ref 6–20)
Bilirubin Total: 0.3 mg/dL (ref 0.0–1.2)
CO2: 20 mmol/L (ref 20–29)
Calcium: 9 mg/dL (ref 8.7–10.2)
Chloride: 102 mmol/L (ref 96–106)
Creatinine, Ser: 0.78 mg/dL (ref 0.57–1.00)
GFR calc Af Amer: 116 mL/min/{1.73_m2} (ref >59)
GFR calc non Af Amer: 100 mL/min/{1.73_m2} (ref >59)
Globulin, Total: 2.5 g/dL (ref 1.5–4.5)
Glucose: 76 mg/dL (ref 65–99)
Potassium: 4.5 mmol/L (ref 3.5–5.2)
Sodium: 137 mmol/L (ref 134–144)
Total Protein: 5.9 g/dL — ABNORMAL LOW (ref 6.0–8.5)

## 2018-03-20 MED ORDER — PHENAZOPYRIDINE HCL 200 MG PO TABS: 200.0000 mg | ORAL_TABLET | Freq: Three times a day (TID) | ORAL | 2 refills | Status: DC | PRN

## 2018-03-20 NOTE — Telephone Encounter (Signed)
Pt called stating that the pharmacy informed her that the prescription for pyridium only comes in 100mg  and 200mg . The prescription that was sent is for 95mg . Informed pt that I would let Manus Gunning know to see if she is willing to change the dosage. Advised to check with her pharmacy later today. Pt verbalized understanding.

## 2018-03-20 NOTE — Telephone Encounter (Signed)
Dose changed

## 2018-03-23 DIAGNOSIS — O1490 Unspecified pre-eclampsia, unspecified trimester: Secondary | ICD-10-CM

## 2018-03-23 HISTORY — DX: Unspecified pre-eclampsia, unspecified trimester: O14.90

## 2018-03-24 ENCOUNTER — Encounter: Payer: Self-pay | Admitting: Obstetrics and Gynecology

## 2018-03-24 ENCOUNTER — Inpatient Hospital Stay (HOSPITAL_COMMUNITY)
Admission: AD | Admit: 2018-03-24 | Discharge: 2018-03-24 | Disposition: A | Discharge status: Home / Self Care | Payer: Federal, State, Local not specified - PPO | Source: Ambulatory Visit | Attending: Obstetrics and Gynecology | Admitting: Obstetrics and Gynecology

## 2018-03-24 ENCOUNTER — Encounter (HOSPITAL_COMMUNITY): Payer: Self-pay | Admitting: *Deleted

## 2018-03-24 ENCOUNTER — Other Ambulatory Visit: Payer: Self-pay

## 2018-03-24 ENCOUNTER — Inpatient Hospital Stay (HOSPITAL_COMMUNITY): Payer: Federal, State, Local not specified - PPO

## 2018-03-24 DIAGNOSIS — O99513 Diseases of the respiratory system complicating pregnancy, third trimester: Secondary | ICD-10-CM | POA: Insufficient documentation

## 2018-03-24 DIAGNOSIS — O26832 Pregnancy related renal disease, second trimester: Secondary | ICD-10-CM

## 2018-03-24 DIAGNOSIS — O99343 Other mental disorders complicating pregnancy, third trimester: Secondary | ICD-10-CM | POA: Diagnosis not present

## 2018-03-24 DIAGNOSIS — F329 Major depressive disorder, single episode, unspecified: Secondary | ICD-10-CM | POA: Insufficient documentation

## 2018-03-24 DIAGNOSIS — Z9689 Presence of other specified functional implants: Secondary | ICD-10-CM | POA: Diagnosis not present

## 2018-03-24 DIAGNOSIS — O26833 Pregnancy related renal disease, third trimester: Secondary | ICD-10-CM | POA: Diagnosis not present

## 2018-03-24 DIAGNOSIS — O26893 Other specified pregnancy related conditions, third trimester: Secondary | ICD-10-CM | POA: Diagnosis not present

## 2018-03-24 DIAGNOSIS — Z885 Allergy status to narcotic agent status: Secondary | ICD-10-CM | POA: Insufficient documentation

## 2018-03-24 DIAGNOSIS — R109 Unspecified abdominal pain: Secondary | ICD-10-CM | POA: Diagnosis not present

## 2018-03-24 DIAGNOSIS — Z87440 Personal history of urinary (tract) infections: Secondary | ICD-10-CM | POA: Insufficient documentation

## 2018-03-24 DIAGNOSIS — Z3A34 34 weeks gestation of pregnancy: Secondary | ICD-10-CM | POA: Diagnosis not present

## 2018-03-24 DIAGNOSIS — R310 Gross hematuria: Secondary | ICD-10-CM | POA: Diagnosis not present

## 2018-03-24 DIAGNOSIS — K589 Irritable bowel syndrome without diarrhea: Secondary | ICD-10-CM | POA: Insufficient documentation

## 2018-03-24 DIAGNOSIS — N1330 Unspecified hydronephrosis: Secondary | ICD-10-CM

## 2018-03-24 DIAGNOSIS — N200 Calculus of kidney: Secondary | ICD-10-CM | POA: Diagnosis not present

## 2018-03-24 DIAGNOSIS — Z87891 Personal history of nicotine dependence: Secondary | ICD-10-CM | POA: Insufficient documentation

## 2018-03-24 DIAGNOSIS — R319 Hematuria, unspecified: Secondary | ICD-10-CM

## 2018-03-24 DIAGNOSIS — N1339 Other hydronephrosis: Secondary | ICD-10-CM | POA: Diagnosis not present

## 2018-03-24 DIAGNOSIS — J45909 Unspecified asthma, uncomplicated: Secondary | ICD-10-CM | POA: Diagnosis not present

## 2018-03-24 DIAGNOSIS — O99613 Diseases of the digestive system complicating pregnancy, third trimester: Secondary | ICD-10-CM | POA: Insufficient documentation

## 2018-03-24 DIAGNOSIS — F419 Anxiety disorder, unspecified: Secondary | ICD-10-CM | POA: Diagnosis not present

## 2018-03-24 DIAGNOSIS — K219 Gastro-esophageal reflux disease without esophagitis: Secondary | ICD-10-CM | POA: Diagnosis not present

## 2018-03-24 DIAGNOSIS — Z3403 Encounter for supervision of normal first pregnancy, third trimester: Secondary | ICD-10-CM

## 2018-03-24 DIAGNOSIS — Z79899 Other long term (current) drug therapy: Secondary | ICD-10-CM | POA: Diagnosis not present

## 2018-03-24 DIAGNOSIS — N132 Hydronephrosis with renal and ureteral calculous obstruction: Secondary | ICD-10-CM | POA: Insufficient documentation

## 2018-03-24 HISTORY — DX: Unspecified hydronephrosis: N13.30

## 2018-03-24 LAB — URINALYSIS, MICROSCOPIC (REFLEX)

## 2018-03-24 LAB — COMPREHENSIVE METABOLIC PANEL
ALBUMIN: 3.1 g/dL — AB (ref 3.5–5.0)
ALK PHOS: 93 U/L (ref 38–126)
ALT: 14 U/L (ref 0–44)
AST: 13 U/L — AB (ref 15–41)
Anion gap: 11 (ref 5–15)
BUN: 18 mg/dL (ref 6–20)
CALCIUM: 9 mg/dL (ref 8.9–10.3)
CO2: 19 mmol/L — ABNORMAL LOW (ref 22–32)
CREATININE: 0.7 mg/dL (ref 0.44–1.00)
Chloride: 104 mmol/L (ref 98–111)
GFR calc Af Amer: >60 mL/min (ref >60)
GFR calc non Af Amer: >60 mL/min (ref >60)
GLUCOSE: 84 mg/dL (ref 70–99)
Potassium: 4.1 mmol/L (ref 3.5–5.1)
Sodium: 134 mmol/L — ABNORMAL LOW (ref 135–145)
TOTAL PROTEIN: 6.5 g/dL (ref 6.5–8.1)
Total Bilirubin: 0.4 mg/dL (ref 0.3–1.2)

## 2018-03-24 LAB — URINALYSIS, ROUTINE W REFLEX MICROSCOPIC

## 2018-03-24 LAB — CBC
HEMATOCRIT: 33.2 % — AB (ref 36.0–46.0)
HEMOGLOBIN: 11 g/dL — AB (ref 12.0–15.0)
MCH: 29.1 pg (ref 26.0–34.0)
MCHC: 33.1 g/dL (ref 30.0–36.0)
MCV: 87.8 fL (ref 78.0–100.0)
Platelets: 379 10*3/uL (ref 150–400)
RBC: 3.78 MIL/uL — ABNORMAL LOW (ref 3.87–5.11)
RDW: 13.9 % (ref 11.5–15.5)
WBC: 15.5 10*3/uL — AB (ref 4.0–10.5)

## 2018-03-24 MED ORDER — HYDROMORPHONE HCL 1 MG/ML IJ SOLN
1.0000 mg | Freq: Once | INTRAMUSCULAR | Status: AC
  Administered 2018-03-24: 1 mg via INTRAVENOUS

## 2018-03-24 MED ORDER — PROMETHAZINE HCL 25 MG/ML IJ SOLN
12.5000 mg | Freq: Four times a day (QID) | INTRAMUSCULAR | Status: DC | PRN
  Administered 2018-03-24: 12.5 mg via INTRAVENOUS
  Filled 2018-03-24: qty 1

## 2018-03-24 MED ORDER — HYDROMORPHONE HCL 1 MG/ML IJ SOLN
1.0000 mg | Freq: Once | INTRAMUSCULAR | Status: DC
  Filled 2018-03-24: qty 1

## 2018-03-24 MED ORDER — PROMETHAZINE HCL 25 MG PO TABS: 12.5000 mg | ORAL_TABLET | Freq: Four times a day (QID) | ORAL | 2 refills | Status: DC | PRN

## 2018-03-24 MED ORDER — HYDROMORPHONE HCL 1 MG/ML IJ SOLN
1.0000 mg | Freq: Once | INTRAMUSCULAR | Status: AC
  Administered 2018-03-24: 1 mg via INTRAVENOUS
  Filled 2018-03-24: qty 1

## 2018-03-24 MED ORDER — HYDROMORPHONE HCL 2 MG PO TABS: 2.0000 mg | ORAL_TABLET | Freq: Four times a day (QID) | ORAL | 0 refills | Status: DC | PRN

## 2018-03-24 MED ORDER — LACTATED RINGERS IV BOLUS
1000.0000 mL | Freq: Once | INTRAVENOUS | Status: AC
  Administered 2018-03-24: 1000 mL via INTRAVENOUS

## 2018-03-24 NOTE — MAU Provider Note (Signed)
Chief Complaint:  Abdominal Pain; Back Pain; and Hematuria   First Provider Initiated Contact with Patient 03/24/18 1240      HPI: Melody Stewart is a 32 y.o. G1P0000 at [redacted]w[redacted]d with current renal calculi and stent placement 03/16/18 who presents to maternity admissions reporting increased hematuria and cramping abdominal and back pain. She reports some hematuria since stents placed bilaterally by urology on 8/25 but amount of blood increased 4-5 days ago and she is seeing bleeding with small clots now.  There is also dysuria.  She reports abdominal cramping intermittently in her low abdomen radiating to her low back started yesterday.  She is managing her pain at home with PO Tylenol and Dilaudid and the medicine does help. She last took both last night.  There are no other associated symptoms. She has not tried other treatments.   She reports good fetal movement, denies LOF, vaginal bleeding, vaginal itching/burning, urinary symptoms, h/a, dizziness, n/v, or fever/chills.    HPI  Past Medical History: Past Medical History:  Diagnosis Date  . Anxiety and depression 09/14/2015  . Asthma   . BV (bacterial vaginosis) 02/24/2016  . Colitis   . Fibroadenoma of right breast   . GERD (gastroesophageal reflux disease)   . IBS (irritable bowel syndrome)   . Screening for STD (sexually transmitted disease) 09/14/2015  . UTI (urinary tract infection)   . Vaginal itching 09/14/2015    Past obstetric history: OB History  Gravida Para Term Preterm AB Living  1 0 0 0 0 0  SAB TAB Ectopic Multiple Live Births  0 0 0 0      # Outcome Date GA Lbr Len/2nd Weight Sex Delivery Anes PTL Lv  1 Current             Past Surgical History: Past Surgical History:  Procedure Laterality Date  . CYSTOSCOPY W/ URETERAL STENT PLACEMENT Bilateral 03/16/2018   Procedure: CYSTOSCOPY WITH RETROGRADE PYELOGRAM/URETERAL STENT PLACEMENT;  Surgeon: Festus Aloe, MD;  Location: [facility];  Service: Urology;  Laterality:  Bilateral;  . KNEE SURGERY    . TONSILLECTOMY    . WISDOM TOOTH EXTRACTION      Family History: Family History  Problem Relation Age of Onset  . Hypertension Mother   . Other Mother        mother had child hydrocephic-died  . Hypertension Father   . Heart disease Paternal Grandfather   . Stroke Paternal Grandfather   . Thyroid disease Maternal Grandmother   . Heart disease Maternal Grandmother   . Cancer Paternal Grandmother        breast  . Thyroid disease Maternal Aunt   . Cancer Paternal Aunt        breast    Social History: Social History   Tobacco Use  . Smoking status: Former Smoker    Types: Cigarettes  . Smokeless tobacco: Never Used  Substance Use Topics  . Alcohol use: No    Alcohol/week: 1.0 standard drinks    Types: 1 Standard drinks or equivalent per week    Frequency: Never    Comment: occ; not now  . Drug use: No    Allergies:  Allergies  Allergen Reactions  . Hydrocodone Other (See Comments)    Make her sick and constipated.    Meds:  No medications prior to admission.    ROS:  Review of Systems  Constitutional: Negative for chills, fatigue and fever.  Eyes: Negative for visual disturbance.  Respiratory: Negative for shortness of  breath.   Cardiovascular: Negative for chest pain.  Gastrointestinal: Positive for abdominal pain. Negative for nausea and vomiting.  Genitourinary: Positive for dysuria, hematuria and pelvic pain. Negative for difficulty urinating, flank pain, vaginal bleeding, vaginal discharge and vaginal pain.  Musculoskeletal: Positive for back pain.  Neurological: Negative for dizziness and headaches.  Psychiatric/Behavioral: Negative.      I have reviewed patient's Past Medical Hx, Surgical Hx, Family Hx, Social Hx, medications and allergies.   Physical Exam   Patient Vitals for the past 24 hrs:  BP Temp Temp src Pulse Resp SpO2 Weight  03/24/18 1615 126/70 98 F (36.7 C) Oral (!) 105 18 - -  03/24/18 1159 - - -  (!) 108 - - -  03/24/18 1141 119/69 98.1 F (36.7 C) Oral (!) 142 20 98 % 109.5 kg   Constitutional: Well-developed, well-nourished female in no acute distress.  Cardiovascular: normal rate Respiratory: normal effort GI: Abd soft, non-tender, gravid appropriate for gestational age.  MS: Extremities nontender, no edema, normal ROM Neurologic: Alert and oriented x 4.  GU: Neg CVAT.   Dilation: Closed Effacement (%): Thick Cervical Position: Posterior Exam by:: L Leftwich-Kirby, CNM  FHT:  Baseline 135 , moderate variability, accelerations present, no decelerations Contractions: rare, mild to palpation   Labs: Results for orders placed or performed during the hospital encounter of 03/24/18 (from the past 24 hour(s))  Urinalysis, Routine w reflex microscopic     Status: Abnormal   Collection Time: 03/24/18 12:37 PM  Result Value Ref Range   Color, Urine RED (A) YELLOW   APPearance TURBID (A) CLEAR   Specific Gravity, Urine  1.005 - 1.030    TEST NOT REPORTED DUE TO COLOR INTERFERENCE OF URINE PIGMENT   pH  5.0 - 8.0    TEST NOT REPORTED DUE TO COLOR INTERFERENCE OF URINE PIGMENT   Glucose, UA (A) NEGATIVE mg/dL    TEST NOT REPORTED DUE TO COLOR INTERFERENCE OF URINE PIGMENT   Hgb urine dipstick (A) NEGATIVE    TEST NOT REPORTED DUE TO COLOR INTERFERENCE OF URINE PIGMENT   Bilirubin Urine (A) NEGATIVE    TEST NOT REPORTED DUE TO COLOR INTERFERENCE OF URINE PIGMENT   Ketones, ur (A) NEGATIVE mg/dL    TEST NOT REPORTED DUE TO COLOR INTERFERENCE OF URINE PIGMENT   Protein, ur (A) NEGATIVE mg/dL    TEST NOT REPORTED DUE TO COLOR INTERFERENCE OF URINE PIGMENT   Nitrite (A) NEGATIVE    TEST NOT REPORTED DUE TO COLOR INTERFERENCE OF URINE PIGMENT   Leukocytes, UA (A) NEGATIVE    TEST NOT REPORTED DUE TO COLOR INTERFERENCE OF URINE PIGMENT  Urinalysis, Microscopic (reflex)     Status: Abnormal   Collection Time: 03/24/18 12:37 PM  Result Value Ref Range   RBC / HPF TOO NUMEROUS TO  COUNT 0 - 5 RBC/hpf   WBC, UA 0-5 0 - 5 WBC/hpf   Bacteria, UA FEW (A) NONE SEEN   Squamous Epithelial / LPF 0-5 0 - 5   Urine-Other MICROSCOPIC EXAM PERFORMED ON UNCONCENTRATED URINE   CBC     Status: Abnormal   Collection Time: 03/24/18  4:03 PM  Result Value Ref Range   WBC 15.5 (H) 4.0 - 10.5 K/uL   RBC 3.78 (L) 3.87 - 5.11 MIL/uL   Hemoglobin 11.0 (L) 12.0 - 15.0 g/dL   HCT 33.2 (L) 36.0 - 46.0 %   MCV 87.8 78.0 - 100.0 fL   MCH 29.1 26.0 - 34.0 pg  MCHC 33.1 30.0 - 36.0 g/dL   RDW 13.9 11.5 - 15.5 %   Platelets 379 150 - 400 K/uL  Comprehensive metabolic panel     Status: Abnormal   Collection Time: 03/24/18  4:03 PM  Result Value Ref Range   Sodium 134 (L) 135 - 145 mmol/L   Potassium 4.1 3.5 - 5.1 mmol/L   Chloride 104 98 - 111 mmol/L   CO2 19 (L) 22 - 32 mmol/L   Glucose, Bld 84 70 - 99 mg/dL   BUN 18 6 - 20 mg/dL   Creatinine, Ser 0.70 0.44 - 1.00 mg/dL   Calcium 9.0 8.9 - 10.3 mg/dL   Total Protein 6.5 6.5 - 8.1 g/dL   Albumin 3.1 (L) 3.5 - 5.0 g/dL   AST 13 (L) 15 - 41 U/L   ALT 14 0 - 44 U/L   Alkaline Phosphatase 93 38 - 126 U/L   Total Bilirubin 0.4 0.3 - 1.2 mg/dL   GFR calc non Af Amer >60 >60 mL/min   GFR calc Af Amer >60 >60 mL/min   Anion gap 11 5 - 15   B/Positive/-- (03/06 1138)  Imaging:  [place] Intraoperative  Result Date: 03/16/2018 CLINICAL DATA:  Ultrasound was provided for use by the ordering physician, and a technical charge was applied by the performing facility.  No radiologist interpretation/professional services rendered.   US Renal  Result Date: 03/24/2018 CLINICAL DATA:  Bilateral stent placement 03/16/2018, urinating blood, patient is [redacted] weeks pregnant EXAM: RENAL / URINARY TRACT ULTRASOUND COMPLETE COMPARISON:  03/15/2017, 03/11/2017 01/27/2017 FINDINGS: Right Kidney: Length: 12.4 cm. Cortical echogenicity is within normal limits. Mild to moderate right hydronephrosis, decreased compared with 03/15/2018. Left Kidney: Length: 12.4 cm.  Mild left hydronephrosis, decreased compared with 03/15/2018. Cortical echogenicity within normal limits. Previously noted obstructing proximal stone not clearly depicted on the current images. Bladder: Partially visible stents within the bladder. IMPRESSION: 1. Bilateral hydronephrosis, mild to moderate on the right and mild on the left, decreased compared to prior sonogram from 03/15/2018 2. Partially visible stents within the urinary bladder Electronically Signed   By: Donavan Foil M.D.   On: 03/24/2018 14:07   US Renal  Result Date: 03/15/2018 CLINICAL DATA:  Pregnant female in the third trimester. Left flank pain. Known renal stone. EXAM: RENAL / URINARY TRACT ULTRASOUND COMPLETE COMPARISON:  03/11/2018 renal sonogram FINDINGS: Right Kidney: Length: 12.5 cm. Normal right renal parenchymal thickness and echogenicity. Moderate right hydroureteronephrosis is not appreciably changed. No right renal mass. Left Kidney: Length: 12.7 cm. Normal left renal parenchymal thickness and echogenicity. Moderate left hydroureteronephrosis is stable. No change in position of obstructing 1 cm stone in the proximal left ureter. No left renal mass. Bladder: Bladder completely collapsed and not well evaluated on this scan. IMPRESSION: 1. Stable position of obstructing 1 cm proximal left ureteral stone. Stable moderate left hydroureteronephrosis. 2. Stable moderate right hydroureteronephrosis. 3. Collapsed bladder, not well evaluated. Electronically Signed   By: Ilona Sorrel M.D.   On: 03/15/2018 10:30    MAU Course/MDM: Renal US with improved hydronephrosis CBC, CMP wnl Pain improved with IV dilaudid, second dose given prior to discharge NST reviewed and reactive Cervix closed/thick/high, no evidence of preterm labor Consult Dr Glo Herring with assessment and findings F/U with Urology about hematuria Letter by Dr Glo Herring for pt to be out of work x 2 weeks, reevaluate this at [facility] after 2 weeks Rx for Dilaudid  PO renewed, add Phenergan 12.5-25 mg PO with Dilaudid for  improved pain management Pt discharge with strict return precautions.  Assessment: 1. Kidney stone complicating pregnancy, third trimester   2. Kidney stone complicating pregnancy, second trimester   3. Encounter for supervision of normal first pregnancy in third trimester   4. Renal calculus, bilateral   5. Hematuria   6. Gross hematuria   7. Abdominal pain during pregnancy in third trimester     Plan: Discharge home Labor precautions and fetal kick counts Follow-up Information    [facility] Follow up.   Why:  As scheduled for prenatal visits and as needed for worsening symptoms. Return to MAU as needed for emergencies. Contact information: [place] [address] [phone]         Allergies as of 03/24/2018      Reactions   Hydrocodone Other (See Comments)   Make her sick and constipated.      Medication List    TAKE these medications   acetaminophen 325 MG tablet Commonly known as:  TYLENOL Take 650 mg by mouth every 6 (six) hours as needed for moderate pain.   cephALEXin 500 MG capsule Commonly known as:  KEFLEX Take 1 capsule (500 mg total) by mouth at bedtime.   DHA PO Take 1 tablet by mouth daily.   HYDROmorphone 2 MG tablet Commonly known as:  DILAUDID Take 1 tablet (2 mg total) by mouth every 4 (four) hours as needed for severe pain. What changed:  Another medication with the same name was changed. Make sure you understand how and when to take each.   HYDROmorphone 2 MG tablet Commonly known as:  DILAUDID Take 1 tablet (2 mg total) by mouth every 6 (six) hours as needed for severe pain. What changed:  how much to take   nystatin-triamcinolone ointment Commonly known as:  MYCOLOG Apply 1 application topically 2 (two) times daily. To affected area.   phenazopyridine 200 MG tablet Commonly known as:  PYRIDIUM Take 1 tablet (200 mg total) by mouth 3  (three) times daily as needed for pain.   PRENATAL VITAMIN PO Take 1 tablet by mouth daily.   PROBIOTIC DAILY PO Take 1 tablet by mouth daily.   promethazine 25 MG suppository Commonly known as:  PHENERGAN Place 1 suppository (25 mg total) rectally every 6 (six) hours as needed for nausea or vomiting. MAY ALSO BE USED VAGINALLY What changed:  Another medication with the same name was added. Make sure you understand how and when to take each.   promethazine 25 MG tablet Commonly known as:  PHENERGAN Take 0.5-1 tablets (12.5-25 mg total) by mouth every 6 (six) hours as needed for nausea. What changed:  You were already taking a medication with the same name, and this prescription was added. Make sure you understand how and when to take each.   VENTOLIN HFA 108 (90 Base) MCG/ACT inhaler Generic drug:  albuterol Inhale 2 puffs into the lungs every 6 (six) hours as needed for wheezing.       Fatima Blank Certified Nurse-Midwife 03/24/2018 8:23 PM

## 2018-03-24 NOTE — MAU Note (Signed)
Had stints put in a wk ago.  Has been peeing blood.  Pain is getting worse.  Is cramping.

## 2018-03-24 NOTE — MAU Note (Signed)
Urine is very bloody and sent to lab

## 2018-04-02 ENCOUNTER — Ambulatory Visit (INDEPENDENT_AMBULATORY_CARE_PROVIDER_SITE_OTHER): Payer: Federal, State, Local not specified - PPO | Admitting: Obstetrics and Gynecology

## 2018-04-02 ENCOUNTER — Encounter: Payer: Self-pay | Admitting: Obstetrics and Gynecology

## 2018-04-02 ENCOUNTER — Other Ambulatory Visit: Payer: Self-pay

## 2018-04-02 VITALS — BP 138/86 | HR 123 | Wt 249.0 lb

## 2018-04-02 DIAGNOSIS — Z23 Encounter for immunization: Secondary | ICD-10-CM | POA: Diagnosis not present

## 2018-04-02 DIAGNOSIS — Z3403 Encounter for supervision of normal first pregnancy, third trimester: Secondary | ICD-10-CM

## 2018-04-02 DIAGNOSIS — Z331 Pregnant state, incidental: Secondary | ICD-10-CM

## 2018-04-02 DIAGNOSIS — Z3A35 35 weeks gestation of pregnancy: Secondary | ICD-10-CM

## 2018-04-02 DIAGNOSIS — Z1389 Encounter for screening for other disorder: Secondary | ICD-10-CM

## 2018-04-02 LAB — POCT URINALYSIS DIPSTICK OB
Glucose, UA: NEGATIVE
Ketones, UA: NEGATIVE
LEUKOCYTES UA: NEGATIVE
NITRITE UA: NEGATIVE
PROTEIN: NEGATIVE

## 2018-04-02 NOTE — Progress Notes (Addendum)
Patient ID: Melody Stewart, female   DOB: Oct 16, 1985, 32 y.o.   MRN: [id number]    LOW-RISK PREGNANCY VISIT Patient name: Melody Stewart MRN [id number]  Date of birth: Aug 24, 1985 Chief Complaint:   Routine Prenatal Visit  History of Present Illness:   Melody Stewart is a 32 y.o. G92P0000 female at [redacted]w[redacted]d with an Estimated Date of Delivery: 05/02/18 being seen today for ongoing management of a low-risk pregnancy. Is passing clots from ureteral stents. She passed a big clot and felt good for a few days afterwards. She described the clot as big with white streaks that looks "like grains of sand". She hasn't noticed the blood in urine but there is blood whenever she wipes. Is on pain meds and is "miserable" with constant back pain. She didn't get out of bed Monday and got around a little bit Tuesday but stayed mostly in bed.  Contractions: Not present. Vag. Bleeding: None.  Movement: Present. denies leaking of fluid. Review of Systems:   Pertinent items are noted in HPI Denies abnormal vaginal discharge w/ itching/odor/irritation, headaches, visual changes, shortness of breath, chest pain, abdominal pain, severe nausea/vomiting, or problems with urination or bowel movements unless otherwise stated above. Pertinent History Reviewed:  Reviewed past medical,surgical, social, obstetrical and family history.  Reviewed problem list, medications and allergies. Physical Assessment:   Vitals:   04/02/18 1113  BP: 138/86  Pulse: (!) 123  Weight: 249 lb (112.9 kg)  Body mass index is 40.19 kg/m.        Physical Examination:   General appearance: Well appearing, and in no distress  Mental status: Alert, oriented to person, place, and time  Skin: Warm & dry  Cardiovascular: Normal heart rate noted  Respiratory: Normal respiratory effort, no distress  Abdomen: Soft, gravid, nontender  Pelvic: Cervical exam deferred         Extremities: Edema: None  Fetal Status:     Movement: Present    Results for  orders placed or performed in visit on 04/02/18 (from the past 24 hour(s))  POC Urinalysis Dipstick OB   Collection Time: 04/02/18 11:13 AM  Result Value Ref Range   Color, UA     Clarity, UA     Glucose, UA Negative Negative   Bilirubin, UA     Ketones, UA neg    Spec Grav, UA     Blood, UA large    pH, UA     POC Protein UA Negative Negative, Trace   Urobilinogen, UA     Nitrite, UA neg    Leukocytes, UA Negative Negative   Appearance     Odor      Assessment & Plan:  1) Low-risk pregnancy G1P0000 at [redacted]w[redacted]d with an Estimated Date of Delivery: 05/02/18   2) s/p bilateral ureteral stents indwelling ,  3 chronic pain due to stents.  Meds: No orders of the defined types were placed in this encounter.  Labs/procedures today: None  Plan:  1 week f/u for cervical check, GBS            Consider IOL 39 wk due to pain of stents.  Follow-up: No follow-ups on file.                     Orders Placed This Encounter  Procedures  . POC Urinalysis Dipstick OB   By signing my name below, I, Samul Dada, attest that this documentation has been prepared under the direction and in the presence of  Jonnie Kind, MD. Electronically Signed: Samul Dada Medical Scribe. 04/02/18. 11:59 AM.  1

## 2018-04-09 ENCOUNTER — Ambulatory Visit (INDEPENDENT_AMBULATORY_CARE_PROVIDER_SITE_OTHER): Payer: Federal, State, Local not specified - PPO | Admitting: Obstetrics and Gynecology

## 2018-04-09 ENCOUNTER — Encounter: Payer: Self-pay | Admitting: Obstetrics and Gynecology

## 2018-04-09 ENCOUNTER — Other Ambulatory Visit: Payer: Self-pay

## 2018-04-09 VITALS — BP 124/77 | HR 117 | Wt 252.0 lb

## 2018-04-09 DIAGNOSIS — Z1389 Encounter for screening for other disorder: Secondary | ICD-10-CM

## 2018-04-09 DIAGNOSIS — Z3403 Encounter for supervision of normal first pregnancy, third trimester: Secondary | ICD-10-CM | POA: Diagnosis not present

## 2018-04-09 DIAGNOSIS — Z331 Pregnant state, incidental: Secondary | ICD-10-CM

## 2018-04-09 DIAGNOSIS — Z3A36 36 weeks gestation of pregnancy: Secondary | ICD-10-CM

## 2018-04-09 LAB — POCT URINALYSIS DIPSTICK OB
GLUCOSE, UA: NEGATIVE
KETONES UA: NEGATIVE
LEUKOCYTES UA: NEGATIVE
Nitrite, UA: NEGATIVE
POC,PROTEIN,UA: NEGATIVE

## 2018-04-09 LAB — OB RESULTS CONSOLE GBS: STREP GROUP B AG: NEGATIVE

## 2018-04-09 NOTE — Progress Notes (Signed)
   LOW-RISK PREGNANCY VISIT Patient name: Melody Stewart MRN [id number]  Date of birth: October 17, 1985 Chief Complaint:   Routine Prenatal Visit (gbs/gc)  History of Present Illness:   Melody Stewart is a 32 y.o. G24P0000 female at [redacted]w[redacted]d with an Estimated Date of Delivery: 05/02/18 being seen today for ongoing management of a low-risk pregnancy, complicated by [place]. She is still taking prescription dilaudid for her pain due to urinary stents. She has mild back pain that is a lot more relieved since her last visit. . She is sleeping well on lesser pain meds Today she reports mild back pain.  .  .   . denies leaking of fluid. No contractions  Review of Systems:   Pertinent items are noted in HPI Denies abnormal vaginal discharge w/ itching/odor/irritation, headaches, visual changes, shortness of breath, chest pain, abdominal pain, severe nausea/vomiting, or problems with urination or bowel movements unless otherwise stated above. Pertinent History Reviewed:  Reviewed past medical,surgical, social, obstetrical and family history.  Reviewed problem list, medications and allergies. Physical Assessment:  There were no vitals filed for this visit.There is no height or weight on file to calculate BMI.        Physical Examination:   General appearance: Well appearing, and in no distress  Mental status: Alert, oriented to person, place, and time  Skin: Warm & dry  Cardiovascular: Normal heart rate noted  Respiratory: Normal respiratory effort, no distress  Abdomen: Soft, gravid, nontender  Pelvic: Cervical exam performed posterior, long, and high, vertex          Extremities:    Fetal Status:        FHR 140  No results found for this or any previous visit (from the past 24 hour(s)).  Assessment & Plan:  1) Low-risk pregnancy G1P0000 at [redacted]w[redacted]d with an Estimated Date of Delivery: 05/02/18   2) s/p bilateral ureteral stents indwelling ,  3) chronic pain due to stents.   Meds: No  orders of the defined types were placed in this encounter.  Labs/procedures today: GBS/GC  Plan:  Continue routine obstetrical care IOL at 39 weeks   Reviewed: Preterm labor symptoms and general obstetric precautions including but not limited to vaginal bleeding, contractions, leaking of fluid and fetal movement were reviewed in detail with the patient.  All questions were answered  Follow-up: No follow-ups on file.  Orders Placed This Encounter  Procedures  . GC/Chlamydia Probe Amp  . Culture, beta strep (group b only)  . POC Urinalysis Dipstick OB   Jonnie Kind, MD 04/09/2018 9:41 AM  By signing my name below, I, Soijett Blue, attest that this documentation has been prepared under the direction and in the presence of Jonnie Kind, MD. Electronically Signed: Soijett Blue, Medical Scribe. 04/09/18. 9:41 AM.  I personally performed the services described in this documentation, which was SCRIBED in my presence. The recorded information has been reviewed and considered accurate. It has been edited as necessary during review. Jonnie Kind, MD

## 2018-04-12 LAB — GC/CHLAMYDIA PROBE AMP
Chlamydia trachomatis, NAA: NEGATIVE
Neisseria gonorrhoeae by PCR: NEGATIVE

## 2018-04-13 LAB — CULTURE, BETA STREP (GROUP B ONLY): Strep Gp B Culture: NEGATIVE

## 2018-04-15 ENCOUNTER — Inpatient Hospital Stay (HOSPITAL_COMMUNITY)
Admission: AD | Admit: 2018-04-15 | Discharge: 2018-04-20 | DRG: 786 | Disposition: A | Discharge status: Home / Self Care | Payer: Federal, State, Local not specified - PPO | Attending: Obstetrics & Gynecology | Admitting: Obstetrics & Gynecology

## 2018-04-15 ENCOUNTER — Encounter (HOSPITAL_COMMUNITY): Payer: Self-pay

## 2018-04-15 ENCOUNTER — Telehealth: Payer: Self-pay | Admitting: Obstetrics & Gynecology

## 2018-04-15 DIAGNOSIS — D649 Anemia, unspecified: Secondary | ICD-10-CM | POA: Diagnosis present

## 2018-04-15 DIAGNOSIS — Z87891 Personal history of nicotine dependence: Secondary | ICD-10-CM

## 2018-04-15 DIAGNOSIS — O1414 Severe pre-eclampsia complicating childbirth: Principal | ICD-10-CM | POA: Diagnosis present

## 2018-04-15 DIAGNOSIS — J45909 Unspecified asthma, uncomplicated: Secondary | ICD-10-CM | POA: Diagnosis not present

## 2018-04-15 DIAGNOSIS — O26833 Pregnancy related renal disease, third trimester: Secondary | ICD-10-CM | POA: Diagnosis present

## 2018-04-15 DIAGNOSIS — Z23 Encounter for immunization: Secondary | ICD-10-CM | POA: Diagnosis not present

## 2018-04-15 DIAGNOSIS — O99214 Obesity complicating childbirth: Secondary | ICD-10-CM | POA: Diagnosis not present

## 2018-04-15 DIAGNOSIS — N200 Calculus of kidney: Secondary | ICD-10-CM | POA: Diagnosis present

## 2018-04-15 DIAGNOSIS — O9902 Anemia complicating childbirth: Secondary | ICD-10-CM | POA: Diagnosis not present

## 2018-04-15 DIAGNOSIS — O411230 Chorioamnionitis, third trimester, not applicable or unspecified: Secondary | ICD-10-CM | POA: Diagnosis present

## 2018-04-15 DIAGNOSIS — O1413 Severe pre-eclampsia, third trimester: Secondary | ICD-10-CM

## 2018-04-15 DIAGNOSIS — O411290 Chorioamnionitis, unspecified trimester, not applicable or unspecified: Secondary | ICD-10-CM | POA: Diagnosis not present

## 2018-04-15 DIAGNOSIS — Z3A37 37 weeks gestation of pregnancy: Secondary | ICD-10-CM

## 2018-04-15 DIAGNOSIS — Z8249 Family history of ischemic heart disease and other diseases of the circulatory system: Secondary | ICD-10-CM | POA: Diagnosis not present

## 2018-04-15 DIAGNOSIS — O9952 Diseases of the respiratory system complicating childbirth: Secondary | ICD-10-CM | POA: Diagnosis not present

## 2018-04-15 HISTORY — DX: Calculus of kidney: N20.0

## 2018-04-15 LAB — COMPREHENSIVE METABOLIC PANEL
ALT: 12 U/L (ref 0–44)
AST: 16 U/L (ref 15–41)
Albumin: 2.7 g/dL — ABNORMAL LOW (ref 3.5–5.0)
Alkaline Phosphatase: 93 U/L (ref 38–126)
Anion gap: 9 (ref 5–15)
BUN: 20 mg/dL (ref 6–20)
CO2: 21 mmol/L — ABNORMAL LOW (ref 22–32)
Calcium: 9.2 mg/dL (ref 8.9–10.3)
Chloride: 108 mmol/L (ref 98–111)
Creatinine, Ser: 1.12 mg/dL — ABNORMAL HIGH (ref 0.44–1.00)
GFR calc Af Amer: >60 mL/min (ref >60)
GFR calc non Af Amer: >60 mL/min (ref >60)
Glucose, Bld: 115 mg/dL — ABNORMAL HIGH (ref 70–99)
Potassium: 4.1 mmol/L (ref 3.5–5.1)
Sodium: 138 mmol/L (ref 135–145)
Total Bilirubin: 0.7 mg/dL (ref 0.3–1.2)
Total Protein: 5.4 g/dL — ABNORMAL LOW (ref 6.5–8.1)

## 2018-04-15 LAB — URINALYSIS, ROUTINE W REFLEX MICROSCOPIC
Bilirubin Urine: NEGATIVE
Glucose, UA: NEGATIVE mg/dL
Ketones, ur: NEGATIVE mg/dL
Nitrite: POSITIVE — AB
Protein, ur: 100 mg/dL — AB
RBC / HPF: >50 RBC/hpf — ABNORMAL HIGH (ref 0–5)
Specific Gravity, Urine: 1.015 (ref 1.005–1.030)
WBC, UA: >50 WBC/hpf — ABNORMAL HIGH (ref 0–5)
pH: 7 (ref 5.0–8.0)

## 2018-04-15 LAB — PROTEIN / CREATININE RATIO, URINE
Creatinine, Urine: 106 mg/dL
Protein Creatinine Ratio: 1.28 mg/mg{Cre} — ABNORMAL HIGH (ref 0.00–0.15)
Total Protein, Urine: 136 mg/dL

## 2018-04-15 LAB — CBC
HCT: 27.6 % — ABNORMAL LOW (ref 36.0–46.0)
Hemoglobin: 9.4 g/dL — ABNORMAL LOW (ref 12.0–15.0)
MCH: 29.8 pg (ref 26.0–34.0)
MCHC: 34.1 g/dL (ref 30.0–36.0)
MCV: 87.6 fL (ref 78.0–100.0)
Platelets: 332 10*3/uL (ref 150–400)
RBC: 3.15 MIL/uL — ABNORMAL LOW (ref 3.87–5.11)
RDW: 14.8 % (ref 11.5–15.5)
WBC: 13.5 10*3/uL — ABNORMAL HIGH (ref 4.0–10.5)

## 2018-04-15 MED ORDER — SOD CITRATE-CITRIC ACID 500-334 MG/5ML PO SOLN
30.0000 mL | ORAL | Status: DC | PRN
  Filled 2018-04-15: qty 15

## 2018-04-15 MED ORDER — OXYTOCIN 40 UNITS IN LACTATED RINGERS INFUSION - SIMPLE MED
2.5000 [IU]/h | INTRAVENOUS | Status: DC
  Filled 2018-04-15: qty 1000

## 2018-04-15 MED ORDER — CYCLOBENZAPRINE HCL 10 MG PO TABS
10.0000 mg | ORAL_TABLET | Freq: Once | ORAL | Status: AC
  Administered 2018-04-15: 10 mg via ORAL
  Filled 2018-04-15: qty 1

## 2018-04-15 MED ORDER — ACETAMINOPHEN 325 MG PO TABS
650.0000 mg | ORAL_TABLET | ORAL | Status: DC | PRN
  Administered 2018-04-17: 650 mg via ORAL
  Filled 2018-04-15: qty 2

## 2018-04-15 MED ORDER — LACTATED RINGERS IV SOLN
INTRAVENOUS | Status: DC
  Administered 2018-04-15 – 2018-04-16 (×3): via INTRAVENOUS

## 2018-04-15 MED ORDER — TERBUTALINE SULFATE 1 MG/ML IJ SOLN: 0.2500 mg | Freq: Once | INTRAMUSCULAR | Status: DC | PRN

## 2018-04-15 MED ORDER — LIDOCAINE HCL (PF) 1 % IJ SOLN: 30.0000 mL | INTRAMUSCULAR | Status: DC | PRN

## 2018-04-15 MED ORDER — OXYTOCIN BOLUS FROM INFUSION: 500.0000 mL | Freq: Once | INTRAVENOUS | Status: DC

## 2018-04-15 MED ORDER — MISOPROSTOL 25 MCG QUARTER TABLET
25.0000 ug | ORAL_TABLET | ORAL | Status: DC | PRN
  Administered 2018-04-16 (×2): 25 ug via VAGINAL
  Filled 2018-04-15 (×2): qty 1

## 2018-04-15 MED ORDER — LACTATED RINGERS IV SOLN
500.0000 mL | INTRAVENOUS | Status: DC | PRN
  Administered 2018-04-16 (×3): 500 mL via INTRAVENOUS
  Administered 2018-04-16: 250 mL via INTRAVENOUS
  Administered 2018-04-17 (×2): 500 mL via INTRAVENOUS

## 2018-04-15 MED ORDER — MAGNESIUM SULFATE 40 G IN LACTATED RINGERS - SIMPLE
1.0000 g/h | INTRAVENOUS | Status: AC
  Administered 2018-04-16: 1 g/h via INTRAVENOUS
  Administered 2018-04-16: 2 g/h via INTRAVENOUS
  Filled 2018-04-15 (×2): qty 500

## 2018-04-15 MED ORDER — MAGNESIUM SULFATE 40 G IN LACTATED RINGERS - SIMPLE
2.0000 g/h | INTRAVENOUS | Status: DC
  Filled 2018-04-15: qty 500

## 2018-04-15 MED ORDER — MAGNESIUM SULFATE BOLUS VIA INFUSION
4.0000 g | Freq: Once | INTRAVENOUS | Status: AC
  Administered 2018-04-16: 4 g via INTRAVENOUS
  Filled 2018-04-15: qty 500

## 2018-04-15 MED ORDER — ONDANSETRON HCL 4 MG/2ML IJ SOLN
4.0000 mg | Freq: Four times a day (QID) | INTRAMUSCULAR | Status: DC | PRN
  Administered 2018-04-16: 4 mg via INTRAVENOUS
  Filled 2018-04-15: qty 2

## 2018-04-15 MED ORDER — FENTANYL CITRATE (PF) 100 MCG/2ML IJ SOLN
100.0000 ug | INTRAMUSCULAR | Status: DC | PRN
  Administered 2018-04-16 (×8): 100 ug via INTRAVENOUS
  Filled 2018-04-15 (×8): qty 2

## 2018-04-15 NOTE — H&P (Signed)
LABOR AND DELIVERY ADMISSION HISTORY AND PHYSICAL NOTE  Melody Stewart is a 32 y.o. G1P0 at [redacted]w[redacted]d who presents to [facility] with complaints of back pain, abdominal cramping and decreased fetal movement. She reports back pain has been occurring intermittent since diagnoses of Kidney stones in July, had bilateral stents placed 4 weeks ago and reports pain got better. Since 0400 this morning pain intensified, reports sharp constant pain- rates 7/10 has taken Dilaudid 1400 with no relief. She reports back pain is more specific to her right side. She reports abdominal cramping has been intermittent, rates pain 2/10- reports pressure more than abdominal cramping that has been occurring for the past 1-2 days. She reports DFM. Reports she is feeling movement it is "not as much as usual", she reports DFM has been occurring since last night. She denies complication during this pregnancy other than kidney stones, denies complications with hypertension. She reports having a HA that has been occurring since Sunday night, rates pain 6/10- has taken Tylenol and Dilaudid with no relief of HA. She denies RUQ abdominal pain or vision changes. She denies vaginal bleeding, vaginal discharge or LOF.   Prenatal History/Complications: PNC at [facility] Pregnancy complications:  - Persistent UTI during pregnancy, on Keflex  - Kidney stone complicating pregnancy, second trimester  - Anxiety and depression  - Abnormal pap smear of cervix   Past Medical History: Past Medical History:  Diagnosis Date  . Anxiety and depression 09/14/2015  . Asthma   . BV (bacterial vaginosis) 02/24/2016  . Colitis   . Fibroadenoma of right breast   . GERD (gastroesophageal reflux disease)   . IBS (irritable bowel syndrome)   . Nephrolithiasis   . Screening for STD (sexually transmitted disease) 09/14/2015  . UTI (urinary tract infection)   . Vaginal itching 09/14/2015    Past Surgical History: Past Surgical History:  Procedure Laterality Date  .  CYSTOSCOPY W/ URETERAL STENT PLACEMENT Bilateral 03/16/2018   Procedure: CYSTOSCOPY WITH RETROGRADE PYELOGRAM/URETERAL STENT PLACEMENT;  Surgeon: Festus Aloe, MD;  Location: [facility];  Service: Urology;  Laterality: Bilateral;  . KNEE SURGERY    . TONSILLECTOMY    . WISDOM TOOTH EXTRACTION      Obstetrical History: OB History    Gravida  1   Para  0   Term  0   Preterm  0   AB  0   Living  0     SAB  0   TAB  0   Ectopic  0   Multiple  0   Live Births              Social History: Social History   Socioeconomic History  . Marital status: Single    Spouse name: Not on file  . Number of children: Not on file  . Years of education: Not on file  . Highest education level: Not on file  Occupational History  . Not on file  Social Needs  . Financial resource strain: Not on file  . Food insecurity:    Worry: Not on file    Inability: Not on file  . Transportation needs:    Medical: Not on file    Non-medical: Not on file  Tobacco Use  . Smoking status: Former Smoker    Types: Cigarettes  . Smokeless tobacco: Never Used  Substance and Sexual Activity  . Alcohol use: No    Alcohol/week: 1.0 standard drinks    Types: 1 Standard drinks or equivalent per week  Frequency: Never    Comment: occ; not now  . Drug use: No  . Sexual activity: Yes    Birth control/protection: None  Lifestyle  . Physical activity:    Days per week: Not on file    Minutes per session: Not on file  . Stress: Not on file  Relationships  . Social connections:    Talks on phone: Not on file    Gets together: Not on file    Attends religious service: Not on file    Active member of club or organization: Not on file    Attends meetings of clubs or organizations: Not on file    Relationship status: Not on file  Other Topics Concern  . Not on file  Social History Narrative  . Not on file    Family History: Family History  Problem Relation Age of Onset  . Hypertension  Mother   . Other Mother        mother had child hydrocephic-died  . Hypertension Father   . Heart disease Paternal Grandfather   . Stroke Paternal Grandfather   . Thyroid disease Maternal Grandmother   . Heart disease Maternal Grandmother   . Cancer Paternal Grandmother        breast  . Thyroid disease Maternal Aunt   . Cancer Paternal Aunt        breast    Allergies: Allergies  Allergen Reactions  . Hydrocodone Other (See Comments)    Make her sick and constipated.    Medications Prior to Admission  Medication Sig Dispense Refill Last Dose  . cephALEXin (KEFLEX) 500 MG capsule Take 1 capsule (500 mg total) by mouth at bedtime. 30 capsule 6 04/14/2018 at Unknown time  . HYDROmorphone (DILAUDID) 2 MG tablet Take 1 tablet (2 mg total) by mouth every 6 (six) hours as needed for severe pain. 30 tablet 0 04/15/2018 at 1400  . acetaminophen (TYLENOL) 325 MG tablet Take 650 mg by mouth every 6 (six) hours as needed for moderate pain.   Taking  . Docosahexaenoic Acid (DHA PO) Take 1 tablet by mouth daily.    Taking  . phenazopyridine (PYRIDIUM) 200 MG tablet Take 1 tablet (200 mg total) by mouth 3 (three) times daily as needed for pain. 30 tablet 2 Taking  . Prenatal Vit-Fe Fumarate-FA (PRENATAL VITAMIN PO) Take 1 tablet by mouth daily.    Taking  . Probiotic Product (PROBIOTIC DAILY PO) Take 1 tablet by mouth daily.   Taking  . promethazine (PHENERGAN) 25 MG suppository Place 1 suppository (25 mg total) rectally every 6 (six) hours as needed for nausea or vomiting. MAY ALSO BE USED VAGINALLY (Patient not taking: Reported on 04/09/2018) 12 each 0 Not Taking  . promethazine (PHENERGAN) 25 MG tablet Take 0.5-1 tablets (12.5-25 mg total) by mouth every 6 (six) hours as needed for nausea. 30 tablet 2 Taking  . VENTOLIN HFA 108 (90 Base) MCG/ACT inhaler Inhale 2 puffs into the lungs every 6 (six) hours as needed for wheezing.    Taking     Review of Systems  All systems reviewed and negative  except as stated in HPI  Physical Exam Blood pressure (!) 147/75, pulse (!) 115, temperature 99 F (37.2 C), resp. rate (!) 21, last menstrual period 07/20/2017. General appearance: alert, cooperative and no distress Lungs: clear to auscultation bilaterally Heart: regular rate and rhythm Abdomen: soft, non-tender; bowel sounds normal Extremities: No calf swelling or tenderness, +1 bilateral pedal edema  Presentation: cephalic by  bedside US Fetal monitoring: 145/ moderate variability/ +accels/ no decels  Uterine activity: UI  Dilation: Closed Effacement (%): Thick Exam by:: Esau Grew, RN  MDM: PEC labs reviewed  NST reactive   Results for orders placed or performed during the hospital encounter of 04/15/18 (from the past 24 hour(s))  Urinalysis, Routine w reflex microscopic     Status: Abnormal   Collection Time: 04/15/18  9:43 PM  Result Value Ref Range   Color, Urine Melody Stewart (A) YELLOW   APPearance CLOUDY (A) CLEAR   Specific Gravity, Urine 1.015 1.005 - 1.030   pH 7.0 5.0 - 8.0   Glucose, UA NEGATIVE NEGATIVE mg/dL   Hgb urine dipstick LARGE (A) NEGATIVE   Bilirubin Urine NEGATIVE NEGATIVE   Ketones, ur NEGATIVE NEGATIVE mg/dL   Protein, ur 100 (A) NEGATIVE mg/dL   Nitrite POSITIVE (A) NEGATIVE   Leukocytes, UA MODERATE (A) NEGATIVE   RBC / HPF >50 (H) 0 - 5 RBC/hpf   WBC, UA >50 (H) 0 - 5 WBC/hpf   Bacteria, UA MANY (A) NONE SEEN   Squamous Epithelial / LPF 0-5 0 - 5   WBC Clumps PRESENT   Protein / creatinine ratio, urine     Status: Abnormal   Collection Time: 04/15/18  9:43 PM  Result Value Ref Range   Creatinine, Urine 106.00 mg/dL   Total Protein, Urine 136 mg/dL   Protein Creatinine Ratio 1.28 (H) 0.00 - 0.15 mg/mg[Cre]  CBC     Status: Abnormal   Collection Time: 04/15/18  9:53 PM  Result Value Ref Range   WBC 13.5 (H) 4.0 - 10.5 K/uL   RBC 3.15 (L) 3.87 - 5.11 MIL/uL   Hemoglobin 9.4 (L) 12.0 - 15.0 g/dL   HCT 27.6 (L) 36.0 - 46.0 %   MCV 87.6 78.0 -  100.0 fL   MCH 29.8 26.0 - 34.0 pg   MCHC 34.1 30.0 - 36.0 g/dL   RDW 14.8 11.5 - 15.5 %   Platelets 332 150 - 400 K/uL  Comprehensive metabolic panel     Status: Abnormal   Collection Time: 04/15/18  9:53 PM  Result Value Ref Range   Sodium 138 135 - 145 mmol/L   Potassium 4.1 3.5 - 5.1 mmol/L   Chloride 108 98 - 111 mmol/L   CO2 21 (L) 22 - 32 mmol/L   Glucose, Bld 115 (H) 70 - 99 mg/dL   BUN 20 6 - 20 mg/dL   Creatinine, Ser 1.12 (H) 0.44 - 1.00 mg/dL   Calcium 9.2 8.9 - 10.3 mg/dL   Total Protein 5.4 (L) 6.5 - 8.1 g/dL   Albumin 2.7 (L) 3.5 - 5.0 g/dL   AST 16 15 - 41 U/L   ALT 12 0 - 44 U/L   Alkaline Phosphatase 93 38 - 126 U/L   Total Bilirubin 0.7 0.3 - 1.2 mg/dL   GFR calc non Af Amer >60 >60 mL/min   GFR calc Af Amer >60 >60 mL/min   Anion gap 9 5 - 15   Patient reports HA is unrelieved with Tylenol, dilaudid taken at home and Flexeril given in MAU.   Consult with Dr Nehemiah Settle for assessment and management. Recommends admission to L&D for delivery with magnesium initiated for severe PEC.  Prenatal labs: ABO, Rh: B/Positive/-- (03/06 1138) Antibody: Negative (07/10 0914) Rubella: 4.66 (03/06 1138) RPR: Non Reactive (07/10 0914)  HBsAg: Negative (03/06 1138)  HIV: Non Reactive (07/10 0914)  GC/Chlamydia: negative (9/18) GBS:   Negative (9/18)  2 hr Glucola: 89-129-116 Genetic screening:  negative Anatomy US: normal   Melody Stewart Results  Initiated care at 8wk Pap  05/22/17 neg  Dating by 1st trimester U/S 6wk GC/CT Initial:  -/-                  36wks:       Support Person [employer] NT/IT:  neg                 AFP:                 NIPS:   Flu vaccine 09/25/17  [id number]                       SMA:                Sickle Cell:  Tdap vaccine Recommended ~28wks Blood type     B+              Rhogam:  n/a    Antibody    neg  Anatomy US nromal                                      HIV  neg  Circumcision Yes, if boy                                               RPR   neg  Feeding Preference breast HBsAg   neg  Pediatrician List given Rubella  imm  Contraception discussed 2hr GTT                 26-28wks:    89/129/116  Prenatal Classes Info given GBS  (For PCN allergy, check sensitivities)   Prenatal Transfer Tool  Maternal Diabetes: No Genetic Screening: Normal Maternal Ultrasounds/Referrals: Normal Fetal Ultrasounds or other Referrals:  None Maternal Substance Abuse:  No Significant Maternal Medications:  Meds include: Other: Keflex  Significant Maternal Lab Results: Lab values include: Group B Strep negative  Results for orders placed or performed during the hospital encounter of 04/15/18 (from the past 24 hour(s))  Urinalysis, Routine w reflex microscopic   Collection Time: 04/15/18  9:43 PM  Result Value Ref Range   Color, Urine Melody Stewart (A) YELLOW   APPearance CLOUDY (A) CLEAR   Specific Gravity, Urine 1.015 1.005 - 1.030   pH 7.0 5.0 - 8.0   Glucose, UA NEGATIVE NEGATIVE mg/dL   Hgb urine dipstick LARGE (A) NEGATIVE   Bilirubin Urine NEGATIVE NEGATIVE   Ketones, ur NEGATIVE NEGATIVE mg/dL   Protein, ur 100 (A) NEGATIVE mg/dL   Nitrite POSITIVE (A) NEGATIVE   Leukocytes, UA MODERATE (A) NEGATIVE   RBC / HPF >50 (H) 0 - 5 RBC/hpf   WBC, UA >50 (H) 0 - 5 WBC/hpf   Bacteria, UA MANY (A) NONE SEEN   Squamous Epithelial / LPF 0-5 0 - 5   WBC Clumps PRESENT   Protein / creatinine ratio, urine   Collection Time: 04/15/18  9:43 PM  Result Value Ref Range   Creatinine, Urine 106.00 mg/dL   Total Protein, Urine 136 mg/dL   Protein Creatinine Ratio 1.28 (H) 0.00 - 0.15 mg/mg[Cre]  CBC   Collection Time: 04/15/18  9:53 PM  Result  Value Ref Range   WBC 13.5 (H) 4.0 - 10.5 K/uL   RBC 3.15 (L) 3.87 - 5.11 MIL/uL   Hemoglobin 9.4 (L) 12.0 - 15.0 g/dL   HCT 27.6 (L) 36.0 - 46.0 %   MCV 87.6 78.0 - 100.0 fL   MCH 29.8 26.0 - 34.0 pg   MCHC 34.1 30.0 - 36.0 g/dL   RDW 14.8 11.5 - 15.5 %   Platelets 332 150 - 400 K/uL   Comprehensive metabolic panel   Collection Time: 04/15/18  9:53 PM  Result Value Ref Range   Sodium 138 135 - 145 mmol/L   Potassium 4.1 3.5 - 5.1 mmol/L   Chloride 108 98 - 111 mmol/L   CO2 21 (L) 22 - 32 mmol/L   Glucose, Bld 115 (H) 70 - 99 mg/dL   BUN 20 6 - 20 mg/dL   Creatinine, Ser 1.12 (H) 0.44 - 1.00 mg/dL   Calcium 9.2 8.9 - 10.3 mg/dL   Total Protein 5.4 (L) 6.5 - 8.1 g/dL   Albumin 2.7 (L) 3.5 - 5.0 g/dL   AST 16 15 - 41 U/L   ALT 12 0 - 44 U/L   Alkaline Phosphatase 93 38 - 126 U/L   Total Bilirubin 0.7 0.3 - 1.2 mg/dL   GFR calc non Af Amer >60 >60 mL/min   GFR calc Af Amer >60 >60 mL/min   Anion gap 9 5 - 15    Patient Active Problem List   Diagnosis Date Noted  . Kidney stone complicating pregnancy, third trimester 03/15/2018  . AKI (acute kidney injury) ([place]) 03/15/2018  . Tinea corporis 02/19/2018  . Kidney stone complicating pregnancy, second trimester 01/27/2018  . Persistent UTI during pregnancy 09/30/2017  . Supervision of normal first pregnancy 09/25/2017  . Abnormal Pap smear of cervix 05/25/2016  . Anxiety and depression 09/14/2015    Assessment: Melody Stewart is a 32 y.o. G1P0000 at [redacted]w[redacted]d here for IOL due to severe preeclampsia diagnosed today in [facility]U.   #Labor: IOL with cytotec  #Pain: Pain medication ordered PRN  #FWB: Cat I #ID:  GBS neg  #MOF: Breast  #MOC: unsure #PEC: Magnesium initiated    Lajean Manes, CNM 04/15/2018, 11:53 PM

## 2018-04-15 NOTE — Telephone Encounter (Signed)
Patient called stating that she would like for the Doctor to call her in a refill of her medication, pt states that her pharmacy is giving her a hard time. Please contact pt

## 2018-04-15 NOTE — Telephone Encounter (Signed)
Pt called stating that she is still having back pain and is requesting a refill on her dilaudid. Advised pt that I would send her request to a provider and she could check with her pharmacy in the next 24 hours. Pt verbalized understanding .

## 2018-04-15 NOTE — MAU Note (Signed)
Lower back pain came on sharp around 0400.  Pain hasn't let up but gets worse at times.  Mostly on the right side-radiates around toward the front.  Hx of kidney stones with this pregnancy-had stents placed 4 weeks ago.  No LOF/VB.  Decreased FM.

## 2018-04-16 ENCOUNTER — Encounter (HOSPITAL_COMMUNITY): Payer: Self-pay

## 2018-04-16 ENCOUNTER — Inpatient Hospital Stay (HOSPITAL_COMMUNITY): Payer: Federal, State, Local not specified - PPO | Admitting: Anesthesiology

## 2018-04-16 DIAGNOSIS — O1413 Severe pre-eclampsia, third trimester: Secondary | ICD-10-CM

## 2018-04-16 LAB — COMPREHENSIVE METABOLIC PANEL
ALBUMIN: 2.6 g/dL — AB (ref 3.5–5.0)
ALBUMIN: 2.6 g/dL — AB (ref 3.5–5.0)
ALK PHOS: 99 U/L (ref 38–126)
ALT: 12 U/L (ref 0–44)
ALT: 13 U/L (ref 0–44)
ALT: 11 U/L (ref 0–44)
AST: 18 U/L (ref 15–41)
AST: 14 U/L — AB (ref 15–41)
AST: 16 U/L (ref 15–41)
Albumin: 2.5 g/dL — ABNORMAL LOW (ref 3.5–5.0)
Alkaline Phosphatase: 92 U/L (ref 38–126)
Alkaline Phosphatase: 92 U/L (ref 38–126)
Anion gap: 11 (ref 5–15)
Anion gap: 11 (ref 5–15)
Anion gap: 9 (ref 5–15)
BILIRUBIN TOTAL: 0.7 mg/dL (ref 0.3–1.2)
BUN: 19 mg/dL (ref 6–20)
BUN: 17 mg/dL (ref 6–20)
BUN: 15 mg/dL (ref 6–20)
CHLORIDE: 102 mmol/L (ref 98–111)
CHLORIDE: 102 mmol/L (ref 98–111)
CO2: 19 mmol/L — ABNORMAL LOW (ref 22–32)
CO2: 22 mmol/L (ref 22–32)
CO2: 22 mmol/L (ref 22–32)
CREATININE: 1.14 mg/dL — AB (ref 0.44–1.00)
CREATININE: 1 mg/dL (ref 0.44–1.00)
CREATININE: 1.07 mg/dL — AB (ref 0.44–1.00)
Calcium: 8.5 mg/dL — ABNORMAL LOW (ref 8.9–10.3)
Calcium: 8.4 mg/dL — ABNORMAL LOW (ref 8.9–10.3)
Calcium: 8 mg/dL — ABNORMAL LOW (ref 8.9–10.3)
Chloride: 100 mmol/L (ref 98–111)
GFR calc Af Amer: >60 mL/min (ref >60)
GFR calc Af Amer: >60 mL/min (ref >60)
GFR calc Af Amer: >60 mL/min (ref >60)
GFR calc non Af Amer: >60 mL/min (ref >60)
GLUCOSE: 95 mg/dL (ref 70–99)
GLUCOSE: 112 mg/dL — AB (ref 70–99)
Glucose, Bld: 141 mg/dL — ABNORMAL HIGH (ref 70–99)
POTASSIUM: 4 mmol/L (ref 3.5–5.1)
Potassium: 3.6 mmol/L (ref 3.5–5.1)
Potassium: 3.8 mmol/L (ref 3.5–5.1)
Sodium: 132 mmol/L — ABNORMAL LOW (ref 135–145)
Sodium: 133 mmol/L — ABNORMAL LOW (ref 135–145)
Sodium: 133 mmol/L — ABNORMAL LOW (ref 135–145)
TOTAL PROTEIN: 5.7 g/dL — AB (ref 6.5–8.1)
Total Bilirubin: 0.4 mg/dL (ref 0.3–1.2)
Total Bilirubin: 0.9 mg/dL (ref 0.3–1.2)
Total Protein: 6 g/dL — ABNORMAL LOW (ref 6.5–8.1)
Total Protein: 5.8 g/dL — ABNORMAL LOW (ref 6.5–8.1)

## 2018-04-16 LAB — CBC
HCT: 28.9 % — ABNORMAL LOW (ref 36.0–46.0)
HEMATOCRIT: 26.8 % — AB (ref 36.0–46.0)
HEMATOCRIT: 26.7 % — AB (ref 36.0–46.0)
HEMOGLOBIN: 9.1 g/dL — AB (ref 12.0–15.0)
HEMOGLOBIN: 9 g/dL — AB (ref 12.0–15.0)
Hemoglobin: 9.8 g/dL — ABNORMAL LOW (ref 12.0–15.0)
MCH: 29.8 pg (ref 26.0–34.0)
MCH: 29.8 pg (ref 26.0–34.0)
MCH: 30 pg (ref 26.0–34.0)
MCHC: 34 g/dL (ref 30.0–36.0)
MCHC: 33.9 g/dL (ref 30.0–36.0)
MCHC: 33.7 g/dL (ref 30.0–36.0)
MCV: 87.9 fL (ref 78.0–100.0)
MCV: 87.8 fL (ref 78.0–100.0)
MCV: 89 fL (ref 78.0–100.0)
PLATELETS: 316 10*3/uL (ref 150–400)
PLATELETS: 306 10*3/uL (ref 150–400)
Platelets: 292 10*3/uL (ref 150–400)
RBC: 3.05 MIL/uL — AB (ref 3.87–5.11)
RBC: 3.29 MIL/uL — AB (ref 3.87–5.11)
RBC: 3 MIL/uL — AB (ref 3.87–5.11)
RDW: 14.9 % (ref 11.5–15.5)
RDW: 14.8 % (ref 11.5–15.5)
RDW: 14.7 % (ref 11.5–15.5)
WBC: 14.4 10*3/uL — AB (ref 4.0–10.5)
WBC: 14.5 10*3/uL — ABNORMAL HIGH (ref 4.0–10.5)
WBC: 14.8 10*3/uL — AB (ref 4.0–10.5)

## 2018-04-16 LAB — TYPE AND SCREEN
ABO/RH(D): B POS
Antibody Screen: NEGATIVE

## 2018-04-16 LAB — MAGNESIUM
MAGNESIUM: 4.3 mg/dL — AB (ref 1.7–2.4)
MAGNESIUM: 4.4 mg/dL — AB (ref 1.7–2.4)
Magnesium: 4.4 mg/dL — ABNORMAL HIGH (ref 1.7–2.4)

## 2018-04-16 LAB — ABO/RH: ABO/RH(D): B POS

## 2018-04-16 LAB — RPR: RPR Ser Ql: NONREACTIVE

## 2018-04-16 MED ORDER — TERBUTALINE SULFATE 1 MG/ML IJ SOLN: 0.2500 mg | Freq: Once | INTRAMUSCULAR | Status: DC | PRN

## 2018-04-16 MED ORDER — PHENYLEPHRINE 40 MCG/ML (10ML) SYRINGE FOR IV PUSH (FOR BLOOD PRESSURE SUPPORT)
80.0000 ug | PREFILLED_SYRINGE | INTRAVENOUS | Status: AC | PRN
  Administered 2018-04-16 (×3): 80 ug via INTRAVENOUS

## 2018-04-16 MED ORDER — MISOPROSTOL 50MCG HALF TABLET: 50.0000 ug | ORAL_TABLET | Freq: Once | ORAL | Status: DC

## 2018-04-16 MED ORDER — EPHEDRINE 5 MG/ML INJ
10.0000 mg | INTRAVENOUS | Status: DC | PRN
  Filled 2018-04-16: qty 4

## 2018-04-16 MED ORDER — LACTATED RINGERS IV SOLN
500.0000 mL | Freq: Once | INTRAVENOUS | Status: AC
  Administered 2018-04-16: 500 mL via INTRAVENOUS

## 2018-04-16 MED ORDER — DIPHENHYDRAMINE HCL 50 MG/ML IJ SOLN: 12.5000 mg | INTRAMUSCULAR | Status: DC | PRN

## 2018-04-16 MED ORDER — MISOPROSTOL 50MCG HALF TABLET
50.0000 ug | ORAL_TABLET | Freq: Once | ORAL | Status: AC
  Administered 2018-04-16: 50 ug via BUCCAL
  Filled 2018-04-16: qty 1

## 2018-04-16 MED ORDER — EPHEDRINE 5 MG/ML INJ
10.0000 mg | INTRAVENOUS | Status: DC | PRN
  Administered 2018-04-16: 10 mg via INTRAVENOUS

## 2018-04-16 MED ORDER — OXYTOCIN 40 UNITS IN LACTATED RINGERS INFUSION - SIMPLE MED
1.0000 m[IU]/min | INTRAVENOUS | Status: DC
  Administered 2018-04-16 (×2): 2 m[IU]/min via INTRAVENOUS

## 2018-04-16 MED ORDER — PHENYLEPHRINE 40 MCG/ML (10ML) SYRINGE FOR IV PUSH (FOR BLOOD PRESSURE SUPPORT)
80.0000 ug | PREFILLED_SYRINGE | INTRAVENOUS | Status: DC | PRN
  Administered 2018-04-16 – 2018-04-17 (×2): 80 ug via INTRAVENOUS
  Filled 2018-04-16 (×2): qty 10

## 2018-04-16 MED ORDER — FENTANYL 2.5 MCG/ML BUPIVACAINE 1/10 % EPIDURAL INFUSION (WH - ANES)
14.0000 mL/h | INTRAMUSCULAR | Status: DC | PRN
  Administered 2018-04-16 – 2018-04-17 (×2): 14 mL/h via EPIDURAL
  Filled 2018-04-16 (×2): qty 100

## 2018-04-16 MED ORDER — CEPHALEXIN 500 MG PO CAPS
500.0000 mg | ORAL_CAPSULE | Freq: Every day | ORAL | Status: DC
  Administered 2018-04-16: 500 mg via ORAL
  Filled 2018-04-16: qty 1

## 2018-04-16 MED ORDER — LIDOCAINE HCL (PF) 1 % IJ SOLN
INTRAMUSCULAR | Status: DC | PRN
  Administered 2018-04-16: 13 mL via EPIDURAL

## 2018-04-16 NOTE — Progress Notes (Signed)
Patient ID: Melody Stewart, female   DOB: [birth year], 32 y.o.   MRN: [id number] FHR decel occurred at 2040 following hypotensive episode with epidural placement  Patient given Neo for hypotension and fluid bolus Dr Glo Herring also in room  FHR was down for about 3 minutes then returned to 160-170s Prior baseline had ranged from 140-155  UCs irregular  Dilation: Fingertip Effacement (%): Thick Cervical Position: Posterior Station: Ballotable Presentation: Vertex Exam by:: C.Wallace  Foley in place  Will continue to observe

## 2018-04-16 NOTE — Progress Notes (Signed)
Labor Progress Note Melody Stewart is a 32 y.o. G1P0000 at [redacted]w[redacted]d presented for IOL 2/2 Pre-eclampsia w/ SF.  S: Flank pain 2/2 nephrolithiasis is improving, now having more low-back pain. Some discomfort w/ contractions, but doing well. Transient floaters, SOB. Denies HA, CP, RUQ pain. Frustrated w/ slow progression of labor.  O:  BP 136/71   Pulse (!) 116   Temp 98.4 F (36.9 C) (Oral)   Resp 18   Ht 5\' 6"  (1.676 m)   LMP 07/20/2017   SpO2 94%   BMI 40.67 kg/m  EFM: 150/min var/+accels, no decels  CVE: Dilation: Fingertip Effacement (%): Thick Cervical Position: Posterior Station: Ballotable Presentation: Vertex Exam by:: C.Wallace   A&P: 32 y.o. G1P0000 [redacted]w[redacted]d  for IOL 2/2 Pre-eclampsia w/ SF.  #Labor: Cervix remains unfavorable. S/p cytotec intravaginal x3. Unsuccessful attempt for FB placement x2. Start low dose Pit. #Pain: IV fentanyl #FWB: Cat 2 FHT- monitor closely #GBS negative #PEC w/ SF: Symptoms mild, cont Mg 2g/h.       Labs @0930 : Mg therapeutic at 4.3. Cr mildly increased to 1.14      Check Mg level, CBC, CMP q6h.   Demetrius Revel, MD 3:41 PM

## 2018-04-16 NOTE — Anesthesia Preprocedure Evaluation (Signed)
Anesthesia Evaluation  Patient identified by MRN, date of birth, ID band Patient awake    Reviewed: Allergy & Precautions, NPO status , Patient's Chart, lab work & pertinent test results  Airway Mallampati: II  TM Distance: >3 FB Neck ROM: Full    Dental no notable dental hx.    Pulmonary neg pulmonary ROS, asthma , former smoker,    Pulmonary exam normal breath sounds clear to auscultation       Cardiovascular hypertension, negative cardio ROS Normal cardiovascular exam Rhythm:Regular Rate:Normal     Neuro/Psych Anxiety negative neurological ROS  negative psych ROS   GI/Hepatic negative GI ROS, Neg liver ROS, GERD  ,  Endo/Other  negative endocrine ROSMorbid obesity  Renal/GU negative Renal ROS  negative genitourinary   Musculoskeletal negative musculoskeletal ROS (+)   Abdominal   Peds negative pediatric ROS (+)  Hematology negative hematology ROS (+)   Anesthesia Other Findings   Reproductive/Obstetrics negative OB ROS (+) Pregnancy                             Anesthesia Physical  Anesthesia Plan  ASA: III  Anesthesia Plan: Epidural   Post-op Pain Management:    Induction:   PONV Risk Score and Plan:   Airway Management Planned:   Additional Equipment:   Intra-op Plan:   Post-operative Plan:   Informed Consent: I have reviewed the patients History and Physical, chart, labs and discussed the procedure including the risks, benefits and alternatives for the proposed anesthesia with the patient or authorized representative who has indicated his/her understanding and acceptance.     Plan Discussed with:   Anesthesia Plan Comments:         Anesthesia Quick Evaluation

## 2018-04-16 NOTE — Progress Notes (Signed)
Patient ID: Melody Stewart, female   DOB: 07-Sep-1985, 32 y.o.   MRN: [id number] Melody Stewart is a 32 y.o. G1P0000 at [redacted]w[redacted]d admitted for induction of labor due to severe pre-e.  Subjective: Some cramping  Objective: 98.3, 102, 18, 129/65, no elevated bp's since 2316 FHT:  FHR: 135 bpm, variability: moderate,  accelerations:  Present,  decelerations:  Present occ prolonged/lates, subtle, nadir to only 120-125, resolves w/ position changes UC:   irregular  SVE:   Dilation: Closed Effacement (%): Thick Exam by:: Esau Grew, RN  Labs: Lab Results  Component Value Date   WBC 13.5 (H) 04/15/2018   HGB 9.4 (L) 04/15/2018   HCT 27.6 (L) 04/15/2018   MCV 87.6 04/15/2018   PLT 332 04/15/2018    Assessment / Plan: IOL d/t severe pre-e, on mag, s/p cytotec x 1  Labor: cervical ripening Fetal Wellbeing:  Category II Pain Control:  IV pain meds Pre-eclampsia: on mag, bp's stable I/D:  n/a Anticipated MOD:  NSVD  Roma Schanz CNM, WHNP-BC 04/16/2018, 0400

## 2018-04-16 NOTE — Anesthesia Procedure Notes (Signed)
Epidural Patient location during procedure: OB Start time: 04/16/2018 8:26 PM End time: 04/16/2018 8:39 PM  Staffing Anesthesiologist: Lynda Rainwater, MD Performed: anesthesiologist   Preanesthetic Checklist Completed: patient identified, site marked, surgical consent, pre-op evaluation, timeout performed, IV checked, risks and benefits discussed and monitors and equipment checked  Epidural Patient position: sitting Prep: ChloraPrep Patient monitoring: heart rate, cardiac monitor, continuous pulse ox and blood pressure Approach: midline Location: L2-L3 Injection technique: LOR saline  Needle:  Needle type: Tuohy  Needle gauge: 17 G Needle length: 9 cm Needle insertion depth: 7 cm Catheter type: closed end flexible Catheter size: 20 Guage Catheter at skin depth: 12 cm Test dose: negative  Assessment Events: blood not aspirated, injection not painful, no injection resistance, negative IV test and no paresthesia  Additional Notes Reason for block:procedure for pain

## 2018-04-16 NOTE — Anesthesia Pain Management Evaluation Note (Signed)
  CRNA Pain Management Visit Note  Patient: Melody Stewart, 32 y.o., female  "Hello I am a member of the anesthesia team at [facility]. We have an anesthesia team available at all times to provide care throughout the hospital, including epidural management and anesthesia for C-section. I don't know your plan for the delivery whether it a natural birth, water birth, IV sedation, nitrous supplementation, doula or epidural, but we want to meet your pain goals."   1.Was your pain managed to your expectations on prior hospitalizations?   No prior hospitalizations  2.What is your expectation for pain management during this hospitalization?     Epidural  3.How can we help you reach that goal? unsure  Record the patient's initial score and the patient's pain goal.   Pain: 3  Pain Goal: 8 [facility] wants you to be able to say your pain was always managed very well.  [name] 04/16/2018

## 2018-04-16 NOTE — Telephone Encounter (Signed)
Pt was readmitted to the hospital last night

## 2018-04-16 NOTE — Progress Notes (Signed)
Labor Progress Note Melody Stewart is a 32 y.o. G1P0000 at [redacted]w[redacted]d presented for IOL 2/2 Pre-eclampsia w/ SF.  S: Flank pain 2/2 nephrolithiasis is improving, now having more low-back pain. Asking about epidural. Some grogginess. Frustrated w/ slow progression of labor.  O:  BP 135/65   Pulse (!) 118   Temp 98.4 F (36.9 C) (Oral)   Resp 18   Ht 5\' 6"  (1.676 m)   Wt 114.3 kg   LMP 07/20/2017   SpO2 94%   BMI 40.67 kg/m  EFM: 165/min var/ occ accels, no decels  CVE: Dilation: Fingertip Effacement (%): Thick Cervical Position: Posterior Station: Ballotable Presentation: Vertex Exam by:: C.Wallace   A&P: 32 y.o. G1P0000 [redacted]w[redacted]d  for IOL 2/2 Pre-eclampsia w/ SF.  #Labor: Cervix remains unfavorable. S/p cytotec intravaginal x3.Cont Pit. Place Cook Catheter@1945  #Pain: IV fentanyl, epidural on request #FWB: Cat 2 FHT- monitor closely #GBS negative #PEC w/ SF: Symptoms mild, decrease Mg to 1g/h considering reduced GFR      Labs @1530 : Mg therapeutic at 4.4. Cr down to 1 from 1.14      Check Mg level, CBC, CMP q6h.   Demetrius Revel, MD 7:51 PM

## 2018-04-16 NOTE — Progress Notes (Signed)
Patient ID: Melody Stewart, female   DOB: 1985-07-25, 32 y.o.   MRN: [id number] Melody Stewart is a 32 y.o. G1P0000 at [redacted]w[redacted]d admitted for induction of labor due to severe pre-e.  Subjective: Feeling some uc's  Objective: BP 137/68   Pulse (!) 107   Temp 98.3 F (36.8 C) (Oral)   Resp 18   Ht 5\' 6"  (1.676 m)   LMP 07/20/2017   SpO2 94%   BMI 40.67 kg/m  Total I/O In: 243.5 [I.V.:243.5] Out: 500 [Urine:500]  FHT:  FHR: 145 bpm, variability: moderate,  accelerations:  Present,  decelerations:  Absent UC:   irregular  SVE:   Dilation: Closed Effacement (%): Thick Exam by:: Esau Grew, RN  Labs: Lab Results  Component Value Date   WBC 13.5 (H) 04/15/2018   HGB 9.4 (L) 04/15/2018   HCT 27.6 (L) 04/15/2018   MCV 87.6 04/15/2018   PLT 332 04/15/2018    Assessment / Plan: IOL d/t severe pre-e, s/p cytotec x 2, plan foley bulb next check if able  Labor: cervical ripening Fetal Wellbeing:  Category I Pain Control:  IV pain meds Pre-eclampsia: on mag, bp's stable I/D:  n/a Anticipated MOD:  NSVD  Roma Schanz CNM, WHNP-BC 04/16/2018, 0800

## 2018-04-16 NOTE — Progress Notes (Signed)
Labor Progress Note Melody Stewart is a 32 y.o. G1P0000 at [redacted]w[redacted]d presented for IOL 2/2 Pre-eclampsia w/ SF.  S: Primary complaint is R flank pain 2/2 nephrolithiasis. Some discomfort w/ contractions, but doing well. Denies HA, vision changes, CP, SOB, RUQ pain.  O:  BP (!) 109/59   Pulse (!) 110   Temp 98.3 F (36.8 C) (Oral)   Resp 18   Ht 5\' 6"  (1.676 m)   LMP 07/20/2017   SpO2 94%   BMI 40.67 kg/m  EFM: 150/mod var/+accels, no decels  CVE: Dilation: Fingertip Effacement (%): Thick Cervical Position: Posterior Station: Ballotable Presentation: Vertex Exam by:: Lattie Haw, CNM   A&P: 32 y.o. G1P0000 [redacted]w[redacted]d  for IOL 2/2 Pre-eclampsia w/ SF.  #Labor: Cervix remains unfavorable. S/p cytotec intravaginal x2, will dose cytotec 50 bu. Unsuccessful attempt for FB placement. Will try again in 4 hrs.  #Pain: IV fentanyl #FWB: Cat 1 FHT #GBS negative #PEC w/ SF: Currently asymptomatic, cont Mg 1g/h.       Mg therapeutic at 4.3.       Cr mildly increased to 1.14      Check Mg level, CBC, CMP q6h.   Demetrius Revel, MD 10:37 AM

## 2018-04-17 ENCOUNTER — Other Ambulatory Visit: Payer: Self-pay

## 2018-04-17 ENCOUNTER — Encounter (HOSPITAL_COMMUNITY)
Admission: AD | Disposition: A | Discharge status: Home / Self Care | Payer: Self-pay | Source: Home / Self Care | Attending: Obstetrics & Gynecology

## 2018-04-17 ENCOUNTER — Encounter: Payer: Federal, State, Local not specified - PPO | Admitting: Obstetrics & Gynecology

## 2018-04-17 ENCOUNTER — Encounter (HOSPITAL_COMMUNITY): Payer: Self-pay | Admitting: Obstetrics & Gynecology

## 2018-04-17 ENCOUNTER — Telehealth: Payer: Self-pay | Admitting: *Deleted

## 2018-04-17 DIAGNOSIS — O1414 Severe pre-eclampsia complicating childbirth: Secondary | ICD-10-CM

## 2018-04-17 DIAGNOSIS — O411230 Chorioamnionitis, third trimester, not applicable or unspecified: Secondary | ICD-10-CM

## 2018-04-17 DIAGNOSIS — O1410 Severe pre-eclampsia, unspecified trimester: Secondary | ICD-10-CM

## 2018-04-17 DIAGNOSIS — Z3A37 37 weeks gestation of pregnancy: Secondary | ICD-10-CM

## 2018-04-17 LAB — COMPREHENSIVE METABOLIC PANEL
ALBUMIN: 2.4 g/dL — AB (ref 3.5–5.0)
ALBUMIN: 2.4 g/dL — AB (ref 3.5–5.0)
ALBUMIN: 2.2 g/dL — AB (ref 3.5–5.0)
ALK PHOS: 89 U/L (ref 38–126)
ALK PHOS: 84 U/L (ref 38–126)
ALT: 11 U/L (ref 0–44)
ALT: 10 U/L (ref 0–44)
ALT: 13 U/L (ref 0–44)
ALT: 11 U/L (ref 0–44)
ANION GAP: 11 (ref 5–15)
ANION GAP: 10 (ref 5–15)
ANION GAP: 11 (ref 5–15)
AST: 16 U/L (ref 15–41)
AST: 13 U/L — ABNORMAL LOW (ref 15–41)
AST: 20 U/L (ref 15–41)
AST: 19 U/L (ref 15–41)
Albumin: 2.2 g/dL — ABNORMAL LOW (ref 3.5–5.0)
Alkaline Phosphatase: 86 U/L (ref 38–126)
Alkaline Phosphatase: 88 U/L (ref 38–126)
Anion gap: 11 (ref 5–15)
BILIRUBIN TOTAL: 0.7 mg/dL (ref 0.3–1.2)
BUN: 16 mg/dL (ref 6–20)
BUN: 17 mg/dL (ref 6–20)
BUN: 17 mg/dL (ref 6–20)
BUN: 18 mg/dL (ref 6–20)
CALCIUM: 7.8 mg/dL — AB (ref 8.9–10.3)
CALCIUM: 7.5 mg/dL — AB (ref 8.9–10.3)
CO2: 20 mmol/L — AB (ref 22–32)
CO2: 20 mmol/L — ABNORMAL LOW (ref 22–32)
CO2: 19 mmol/L — AB (ref 22–32)
CO2: 19 mmol/L — ABNORMAL LOW (ref 22–32)
CREATININE: 1.17 mg/dL — AB (ref 0.44–1.00)
CREATININE: 1.22 mg/dL — AB (ref 0.44–1.00)
Calcium: 7.7 mg/dL — ABNORMAL LOW (ref 8.9–10.3)
Calcium: 7.6 mg/dL — ABNORMAL LOW (ref 8.9–10.3)
Chloride: 100 mmol/L (ref 98–111)
Chloride: 102 mmol/L (ref 98–111)
Chloride: 99 mmol/L (ref 98–111)
Chloride: 101 mmol/L (ref 98–111)
Creatinine, Ser: 1.23 mg/dL — ABNORMAL HIGH (ref 0.44–1.00)
Creatinine, Ser: 1.2 mg/dL — ABNORMAL HIGH (ref 0.44–1.00)
GFR calc Af Amer: >60 mL/min (ref >60)
GFR calc Af Amer: >60 mL/min (ref >60)
GFR calc Af Amer: >60 mL/min (ref >60)
GFR calc Af Amer: >60 mL/min (ref >60)
GFR calc non Af Amer: >60 mL/min (ref >60)
GFR calc non Af Amer: 57 mL/min — ABNORMAL LOW (ref >60)
GFR calc non Af Amer: 59 mL/min — ABNORMAL LOW (ref >60)
GFR, EST NON AFRICAN AMERICAN: 58 mL/min — AB (ref >60)
GLUCOSE: 116 mg/dL — AB (ref 70–99)
GLUCOSE: 109 mg/dL — AB (ref 70–99)
GLUCOSE: 145 mg/dL — AB (ref 70–99)
Glucose, Bld: 136 mg/dL — ABNORMAL HIGH (ref 70–99)
POTASSIUM: 3.9 mmol/L (ref 3.5–5.1)
Potassium: 3.7 mmol/L (ref 3.5–5.1)
Potassium: 3.5 mmol/L (ref 3.5–5.1)
Potassium: 3.6 mmol/L (ref 3.5–5.1)
SODIUM: 131 mmol/L — AB (ref 135–145)
SODIUM: 129 mmol/L — AB (ref 135–145)
Sodium: 132 mmol/L — ABNORMAL LOW (ref 135–145)
Sodium: 131 mmol/L — ABNORMAL LOW (ref 135–145)
TOTAL PROTEIN: 5.3 g/dL — AB (ref 6.5–8.1)
TOTAL PROTEIN: 5.4 g/dL — AB (ref 6.5–8.1)
Total Bilirubin: 0.8 mg/dL (ref 0.3–1.2)
Total Bilirubin: 0.9 mg/dL (ref 0.3–1.2)
Total Bilirubin: 0.9 mg/dL (ref 0.3–1.2)
Total Protein: 5.8 g/dL — ABNORMAL LOW (ref 6.5–8.1)
Total Protein: 5.4 g/dL — ABNORMAL LOW (ref 6.5–8.1)

## 2018-04-17 LAB — CBC
HCT: 25.9 % — ABNORMAL LOW (ref 36.0–46.0)
HEMATOCRIT: 25.7 % — AB (ref 36.0–46.0)
HEMOGLOBIN: 8.7 g/dL — AB (ref 12.0–15.0)
HEMOGLOBIN: 8.6 g/dL — AB (ref 12.0–15.0)
MCH: 29.4 pg (ref 26.0–34.0)
MCH: 29.5 pg (ref 26.0–34.0)
MCHC: 33.6 g/dL (ref 30.0–36.0)
MCHC: 33.5 g/dL (ref 30.0–36.0)
MCV: 87.5 fL (ref 78.0–100.0)
MCV: 88 fL (ref 78.0–100.0)
Platelets: 290 10*3/uL (ref 150–400)
Platelets: 266 10*3/uL (ref 150–400)
RBC: 2.96 MIL/uL — ABNORMAL LOW (ref 3.87–5.11)
RBC: 2.92 MIL/uL — AB (ref 3.87–5.11)
RDW: 14.6 % (ref 11.5–15.5)
RDW: 14.6 % (ref 11.5–15.5)
WBC: 12.2 10*3/uL — AB (ref 4.0–10.5)
WBC: 12.2 10*3/uL — ABNORMAL HIGH (ref 4.0–10.5)

## 2018-04-17 LAB — MAGNESIUM
MAGNESIUM: 4.3 mg/dL — AB (ref 1.7–2.4)
Magnesium: 4 mg/dL — ABNORMAL HIGH (ref 1.7–2.4)
Magnesium: 3.9 mg/dL — ABNORMAL HIGH (ref 1.7–2.4)
Magnesium: 3.8 mg/dL — ABNORMAL HIGH (ref 1.7–2.4)

## 2018-04-17 LAB — CULTURE, OB URINE: Culture: <10000 — AB

## 2018-04-17 SURGERY — Surgical Case
Anesthesia: Epidural

## 2018-04-17 MED ORDER — LACTATED RINGERS IV SOLN
INTRAVENOUS | Status: DC | PRN
  Administered 2018-04-17: 10:00:00 via INTRAVENOUS

## 2018-04-17 MED ORDER — NALBUPHINE HCL 10 MG/ML IJ SOLN: 5.0000 mg | Freq: Once | INTRAMUSCULAR | Status: DC | PRN

## 2018-04-17 MED ORDER — PHENYLEPHRINE HCL 10 MG/ML IJ SOLN
INTRAMUSCULAR | Status: DC | PRN
  Administered 2018-04-17: 120 ug via INTRAVENOUS
  Administered 2018-04-17 (×2): 80 ug via INTRAVENOUS

## 2018-04-17 MED ORDER — SENNOSIDES-DOCUSATE SODIUM 8.6-50 MG PO TABS: 2.0000 | ORAL_TABLET | ORAL | Status: DC

## 2018-04-17 MED ORDER — TETANUS-DIPHTH-ACELL PERTUSSIS 5-2.5-18.5 LF-MCG/0.5 IM SUSP: 0.5000 mL | Freq: Once | INTRAMUSCULAR | Status: DC

## 2018-04-17 MED ORDER — KETOROLAC TROMETHAMINE 30 MG/ML IJ SOLN: 30.0000 mg | Freq: Four times a day (QID) | INTRAMUSCULAR | Status: DC | PRN

## 2018-04-17 MED ORDER — WITCH HAZEL-GLYCERIN EX PADS: 1.0000 "application " | MEDICATED_PAD | CUTANEOUS | Status: DC | PRN

## 2018-04-17 MED ORDER — OXYTOCIN 10 UNIT/ML IJ SOLN
INTRAMUSCULAR | Status: AC
  Filled 2018-04-17: qty 4

## 2018-04-17 MED ORDER — NALBUPHINE HCL 10 MG/ML IJ SOLN: 5.0000 mg | INTRAMUSCULAR | Status: DC | PRN

## 2018-04-17 MED ORDER — OXYTOCIN 40 UNITS IN LACTATED RINGERS INFUSION - SIMPLE MED: 2.5000 [IU]/h | INTRAVENOUS | Status: AC

## 2018-04-17 MED ORDER — OXYCODONE-ACETAMINOPHEN 5-325 MG PO TABS
1.0000 | ORAL_TABLET | ORAL | Status: DC | PRN
  Administered 2018-04-19: 1 via ORAL
  Filled 2018-04-17: qty 1

## 2018-04-17 MED ORDER — OXYTOCIN 10 UNIT/ML IJ SOLN
INTRAVENOUS | Status: DC | PRN
  Administered 2018-04-17: 40 [IU] via INTRAVENOUS

## 2018-04-17 MED ORDER — DIPHENHYDRAMINE HCL 25 MG PO CAPS: 25.0000 mg | ORAL_CAPSULE | Freq: Four times a day (QID) | ORAL | Status: DC | PRN

## 2018-04-17 MED ORDER — SODIUM BICARBONATE 8.4 % IV SOLN
INTRAVENOUS | Status: DC | PRN
  Administered 2018-04-17 (×3): 5 mL via EPIDURAL

## 2018-04-17 MED ORDER — ENOXAPARIN SODIUM 60 MG/0.6ML ~~LOC~~ SOLN
0.5000 mg/kg | SUBCUTANEOUS | Status: DC
  Administered 2018-04-18 – 2018-04-20 (×3): 55 mg via SUBCUTANEOUS
  Filled 2018-04-17 (×4): qty 0.6

## 2018-04-17 MED ORDER — PHENYLEPHRINE 40 MCG/ML (10ML) SYRINGE FOR IV PUSH (FOR BLOOD PRESSURE SUPPORT)
PREFILLED_SYRINGE | INTRAVENOUS | Status: AC
  Filled 2018-04-17: qty 10

## 2018-04-17 MED ORDER — ZOLPIDEM TARTRATE 5 MG PO TABS: 5.0000 mg | ORAL_TABLET | Freq: Every evening | ORAL | Status: DC | PRN

## 2018-04-17 MED ORDER — SODIUM CHLORIDE 0.9 % IV SOLN
500.0000 mg | Freq: Once | INTRAVENOUS | Status: AC
  Administered 2018-04-17: 500 mg via INTRAVENOUS
  Filled 2018-04-17: qty 500

## 2018-04-17 MED ORDER — METHYLERGONOVINE MALEATE 0.2 MG/ML IJ SOLN
INTRAMUSCULAR | Status: AC
  Filled 2018-04-17: qty 1

## 2018-04-17 MED ORDER — SCOPOLAMINE 1 MG/3DAYS TD PT72
MEDICATED_PATCH | TRANSDERMAL | Status: AC
  Filled 2018-04-17: qty 1

## 2018-04-17 MED ORDER — METHYLERGONOVINE MALEATE 0.2 MG/ML IJ SOLN
INTRAMUSCULAR | Status: DC | PRN
  Administered 2018-04-17: 0.2 mg via INTRAMUSCULAR

## 2018-04-17 MED ORDER — LIDOCAINE-EPINEPHRINE (PF) 2 %-1:200000 IJ SOLN
INTRAMUSCULAR | Status: AC
  Filled 2018-04-17: qty 20

## 2018-04-17 MED ORDER — MORPHINE SULFATE (PF) 0.5 MG/ML IJ SOLN
INTRAMUSCULAR | Status: DC | PRN
  Administered 2018-04-17: 3 mg via EPIDURAL

## 2018-04-17 MED ORDER — DEXAMETHASONE SODIUM PHOSPHATE 10 MG/ML IJ SOLN
INTRAMUSCULAR | Status: DC | PRN
  Administered 2018-04-17: 4 mg via INTRAVENOUS

## 2018-04-17 MED ORDER — GENTAMICIN SULFATE 40 MG/ML IJ SOLN
5.0000 mg/kg | INTRAVENOUS | Status: DC
  Filled 2018-04-17: qty 10.25

## 2018-04-17 MED ORDER — PRENATAL MULTIVITAMIN CH
1.0000 | ORAL_TABLET | Freq: Every day | ORAL | Status: DC
  Administered 2018-04-18 – 2018-04-20 (×2): 1 via ORAL
  Filled 2018-04-17 (×2): qty 1

## 2018-04-17 MED ORDER — LACTATED RINGERS IV SOLN: INTRAVENOUS | Status: DC

## 2018-04-17 MED ORDER — BUPIVACAINE HCL (PF) 0.5 % IJ SOLN
INTRAMUSCULAR | Status: DC | PRN
  Administered 2018-04-17: 30 mL

## 2018-04-17 MED ORDER — SIMETHICONE 80 MG PO CHEW
80.0000 mg | CHEWABLE_TABLET | Freq: Three times a day (TID) | ORAL | Status: DC
  Administered 2018-04-17 – 2018-04-20 (×9): 80 mg via ORAL
  Filled 2018-04-17 (×9): qty 1

## 2018-04-17 MED ORDER — SODIUM CHLORIDE 0.9 % IV SOLN
2.0000 g | Freq: Four times a day (QID) | INTRAVENOUS | Status: DC
  Administered 2018-04-17: 2 g via INTRAVENOUS
  Filled 2018-04-17: qty 2
  Filled 2018-04-17: qty 2000

## 2018-04-17 MED ORDER — METOCLOPRAMIDE HCL 5 MG/ML IJ SOLN
INTRAMUSCULAR | Status: AC
  Filled 2018-04-17: qty 2

## 2018-04-17 MED ORDER — DIBUCAINE 1 % RE OINT: 1.0000 "application " | TOPICAL_OINTMENT | RECTAL | Status: DC | PRN

## 2018-04-17 MED ORDER — ONDANSETRON HCL 4 MG/2ML IJ SOLN
4.0000 mg | Freq: Four times a day (QID) | INTRAMUSCULAR | Status: DC | PRN
  Administered 2018-04-17: 4 mg via INTRAVENOUS
  Filled 2018-04-17: qty 2

## 2018-04-17 MED ORDER — SODIUM CHLORIDE 0.9 % IR SOLN
Status: DC | PRN
  Administered 2018-04-17: 1000 mL

## 2018-04-17 MED ORDER — SODIUM CHLORIDE 0.9 % IV SOLN
3.0000 g | Freq: Four times a day (QID) | INTRAVENOUS | Status: DC
  Administered 2018-04-17: 3 g via INTRAVENOUS
  Filled 2018-04-17: qty 3

## 2018-04-17 MED ORDER — MENTHOL 3 MG MT LOZG
1.0000 | LOZENGE | OROMUCOSAL | Status: DC | PRN
  Administered 2018-04-18: 3 mg via ORAL
  Filled 2018-04-17: qty 9

## 2018-04-17 MED ORDER — NALOXONE HCL 4 MG/10ML IJ SOLN
1.0000 ug/kg/h | INTRAVENOUS | Status: DC | PRN
  Filled 2018-04-17: qty 5

## 2018-04-17 MED ORDER — SENNOSIDES-DOCUSATE SODIUM 8.6-50 MG PO TABS: 2.0000 | ORAL_TABLET | Freq: Every evening | ORAL | Status: DC | PRN

## 2018-04-17 MED ORDER — METOCLOPRAMIDE HCL 5 MG/ML IJ SOLN
INTRAMUSCULAR | Status: DC | PRN
  Administered 2018-04-17: 10 mg via INTRAVENOUS

## 2018-04-17 MED ORDER — MEASLES, MUMPS & RUBELLA VAC ~~LOC~~ INJ: 0.5000 mL | INJECTION | Freq: Once | SUBCUTANEOUS | Status: DC

## 2018-04-17 MED ORDER — MORPHINE SULFATE (PF) 0.5 MG/ML IJ SOLN
INTRAMUSCULAR | Status: AC
  Filled 2018-04-17: qty 10

## 2018-04-17 MED ORDER — INFLUENZA VAC SPLIT QUAD 0.5 ML IM SUSY: 0.5000 mL | PREFILLED_SYRINGE | INTRAMUSCULAR | Status: DC

## 2018-04-17 MED ORDER — ACETAMINOPHEN 325 MG PO TABS: 650.0000 mg | ORAL_TABLET | ORAL | Status: DC | PRN

## 2018-04-17 MED ORDER — DIPHENHYDRAMINE HCL 50 MG/ML IJ SOLN: 12.5000 mg | INTRAMUSCULAR | Status: DC | PRN

## 2018-04-17 MED ORDER — DIPHENHYDRAMINE HCL 25 MG PO CAPS: 25.0000 mg | ORAL_CAPSULE | ORAL | Status: DC | PRN

## 2018-04-17 MED ORDER — DEXAMETHASONE SODIUM PHOSPHATE 4 MG/ML IJ SOLN
INTRAMUSCULAR | Status: AC
  Filled 2018-04-17: qty 1

## 2018-04-17 MED ORDER — SIMETHICONE 80 MG PO CHEW: 80.0000 mg | CHEWABLE_TABLET | ORAL | Status: DC | PRN

## 2018-04-17 MED ORDER — SODIUM CHLORIDE 0.9 % IV SOLN
3.0000 g | Freq: Four times a day (QID) | INTRAVENOUS | Status: AC
  Administered 2018-04-17 – 2018-04-18 (×4): 3 g via INTRAVENOUS
  Filled 2018-04-17 (×4): qty 3

## 2018-04-17 MED ORDER — SIMETHICONE 80 MG PO CHEW: 80.0000 mg | CHEWABLE_TABLET | ORAL | Status: DC

## 2018-04-17 MED ORDER — BUPIVACAINE HCL (PF) 0.5 % IJ SOLN
INTRAMUSCULAR | Status: AC
  Filled 2018-04-17: qty 30

## 2018-04-17 MED ORDER — ONDANSETRON HCL 4 MG/2ML IJ SOLN
INTRAMUSCULAR | Status: AC
  Filled 2018-04-17: qty 2

## 2018-04-17 MED ORDER — COCONUT OIL OIL
1.0000 "application " | TOPICAL_OIL | Status: DC | PRN
  Administered 2018-04-17: 1 via TOPICAL
  Filled 2018-04-17: qty 120

## 2018-04-17 MED ORDER — SCOPOLAMINE 1 MG/3DAYS TD PT72: 1.0000 | MEDICATED_PATCH | Freq: Once | TRANSDERMAL | Status: DC

## 2018-04-17 MED ORDER — ONDANSETRON HCL 4 MG/2ML IJ SOLN
INTRAMUSCULAR | Status: DC | PRN
  Administered 2018-04-17: 4 mg via INTRAVENOUS

## 2018-04-17 MED ORDER — IBUPROFEN 600 MG PO TABS: 600.0000 mg | ORAL_TABLET | Freq: Four times a day (QID) | ORAL | Status: DC

## 2018-04-17 MED ORDER — OXYCODONE-ACETAMINOPHEN 5-325 MG PO TABS
2.0000 | ORAL_TABLET | ORAL | Status: DC | PRN
  Administered 2018-04-17 – 2018-04-19 (×8): 2 via ORAL
  Filled 2018-04-17 (×8): qty 2

## 2018-04-17 SURGICAL SUPPLY — 39 items
APL SKNCLS STERI-STRIP NONHPOA (GAUZE/BANDAGES/DRESSINGS) ×1
BENZOIN TINCTURE PRP APPL 2/3 (GAUZE/BANDAGES/DRESSINGS) ×2 IMPLANT
BLADE TIP J-PLASMA PRECISE LAP (MISCELLANEOUS) ×2 IMPLANT
CHLORAPREP W/TINT 26ML (MISCELLANEOUS) ×2 IMPLANT
CLAMP CORD UMBIL (MISCELLANEOUS) IMPLANT
CLOTH BEACON ORANGE TIMEOUT ST (SAFETY) ×2 IMPLANT
CLSR STERI-STRIP ANTIMIC 1/2X4 (GAUZE/BANDAGES/DRESSINGS) ×1 IMPLANT
DRSG OPSITE POSTOP 4X10 (GAUZE/BANDAGES/DRESSINGS) ×2 IMPLANT
ELECT REM PT RETURN 9FT ADLT (ELECTROSURGICAL) ×2
ELECTRODE REM PT RTRN 9FT ADLT (ELECTROSURGICAL) ×1 IMPLANT
EXTRACTOR VACUUM KIWI (MISCELLANEOUS) IMPLANT
GLOVE BIO SURGEON STRL SZ7 (GLOVE) ×2 IMPLANT
GLOVE BIOGEL PI IND STRL 7.0 (GLOVE) ×2 IMPLANT
GLOVE BIOGEL PI INDICATOR 7.0 (GLOVE) ×2
GOWN STRL REUS W/TWL LRG LVL3 (GOWN DISPOSABLE) ×4 IMPLANT
GOWN STRL REUS W/TWL XL LVL3 (GOWN DISPOSABLE) ×2 IMPLANT
HOVERMATT SINGLE USE (MISCELLANEOUS) ×1 IMPLANT
KIT ABG SYR 3ML LUER SLIP (SYRINGE) IMPLANT
NDL HYPO 25X5/8 SAFETYGLIDE (NEEDLE) IMPLANT
NEEDLE HYPO 22GX1.5 SAFETY (NEEDLE) ×2 IMPLANT
NEEDLE HYPO 25X5/8 SAFETYGLIDE (NEEDLE) IMPLANT
NS IRRIG 1000ML POUR BTL (IV SOLUTION) ×2 IMPLANT
PACK C SECTION WH (CUSTOM PROCEDURE TRAY) ×2 IMPLANT
PAD OB MATERNITY 4.3X12.25 (PERSONAL CARE ITEMS) ×2 IMPLANT
PENCIL SMOKE EVAC W/HOLSTER (ELECTROSURGICAL) ×2 IMPLANT
RTRCTR C-SECT PINK 25CM LRG (MISCELLANEOUS) IMPLANT
SPONGE LAP 18X18 RF (DISPOSABLE) ×6 IMPLANT
SPONGE SURGIFOAM ABS GEL 12-7 (HEMOSTASIS) IMPLANT
STRIP CLOSURE SKIN 1/2X4 (GAUZE/BANDAGES/DRESSINGS) ×2 IMPLANT
SUT PDS AB 0 CTX 60 (SUTURE) IMPLANT
SUT PLAIN 0 NONE (SUTURE) IMPLANT
SUT SILK 0 TIES 10X30 (SUTURE) IMPLANT
SUT VIC AB 0 CT1 36 (SUTURE) ×6 IMPLANT
SUT VIC AB 3-0 CT1 27 (SUTURE) ×2
SUT VIC AB 3-0 CT1 TAPERPNT 27 (SUTURE) ×1 IMPLANT
SUT VIC AB 4-0 KS 27 (SUTURE) IMPLANT
SYR CONTROL 10ML LL (SYRINGE) ×2 IMPLANT
TOWEL OR 17X24 6PK STRL BLUE (TOWEL DISPOSABLE) ×2 IMPLANT
TRAY FOLEY W/BAG SLVR 14FR LF (SET/KITS/TRAYS/PACK) ×2 IMPLANT

## 2018-04-17 NOTE — Progress Notes (Signed)
Spoke with dr Ihor Dow about plan of care for patient.  Addressed concern about fhr tracing, with periods of minimal variability and late decelerations.  Provider stated she had viewed the tracing.  Provider made aware of significant decrease in urine output, with 30cc the last hour. Provider stated she would come to bedside for SVE.

## 2018-04-17 NOTE — Transfer of Care (Signed)
Immediate Anesthesia Transfer of Care Note  Patient: Melody Stewart  Procedure(s) Performed: CESAREAN SECTION (N/A )  Patient Location: PACU  Anesthesia Type:Epidural  Level of Consciousness: awake, alert  and oriented  Airway & Oxygen Therapy: Patient Spontanous Breathing  Post-op Assessment: Report given to RN and Post -op Vital signs reviewed and stable  Post vital signs: Reviewed and stable  Last Vitals:  Vitals Value Taken Time  BP    Temp    Pulse    Resp    SpO2      Last Pain:  Vitals:   04/17/18 0725  TempSrc: Axillary  PainSc: 0-No pain         Complications: No apparent anesthesia complications

## 2018-04-17 NOTE — Progress Notes (Addendum)
Patient ID: Melody Stewart, female   DOB: Jun 25, 1986, 32 y.o.   MRN: [id number] RN reports vaginal bleeding and blood in urine Upon exam, SROM was noted, clear with red tinge .Cervix 5/70-80/-3/vtx  IUPC inserted FHR equivocal but there was a nice acceleration with scalp stimulation  Will give 535ml fluid bolus to challenge urine output  UCs not strong, need to increase Pitocin  Vitals:   04/17/18 0131 04/17/18 0201 04/17/18 0231 04/17/18 0301  BP: (!) 96/58 (!) 109/42 (!) 101/40 (!) 104/41  Pulse: (!) 120 (!) 110 (!) 105 (!) 112  Resp:  18 18 18   Temp:  99.1 F (37.3 C)    TempSrc:  Axillary    SpO2:      Weight:      Height:       Will continue to observe

## 2018-04-17 NOTE — Progress Notes (Signed)
Pharmacy Antibiotic Note  Melody Stewart is a 32 y.o. female admitted on 04/15/2018 with IOL 2/2 pre-eclampsia with severe features..  Pharmacy has been consulted for Unasyn dosing for maternal fevers.  Plan: Unasyn 3gm IV q 6hrs.  Will discontinue the cephalexin 500mg  po qhs dose for UTI since pt starting on Unasyn.  Height: 5\' 6"  (167.6 cm) Weight: 252 lb (114.3 kg) IBW/kg (Calculated) : 59.3  Temp (24hrs), Avg:99.3 F (37.4 C), Min:98.4 F (36.9 C), Max:101.2 F (38.4 C)  Recent Labs  Lab 04/15/18 2153 04/16/18 0932 04/16/18 1524 04/16/18 2139 04/17/18 0337 04/17/18 0910  WBC 13.5* 14.4* 14.5* 14.8* 12.2* 12.2*  CREATININE 1.12* 1.14* 1.00 1.07* 1.17*  --     Estimated Creatinine Clearance: 87.8 mL/min (A) (by C-G formula based on SCr of 1.17 mg/dL (H)).    Allergies  Allergen Reactions  . Hydrocodone Other (See Comments)    Make her sick and constipated.    Antimicrobials this admission: Ampicillin 2gm IV q6h- was discontinued after 1 dose was given.  Gentamicin 5mg /kg was ordered but never given before order was discontinued Zaithromycin 500mg  IV once   Thank you for allowing pharmacy to be a part of this patient's care.  Wyline Mood 04/17/2018 9:28 AM

## 2018-04-17 NOTE — Progress Notes (Signed)
MOB was referred for history of depression/anxiety. * Referral screened out by Clinical Social Worker because none of the following criteria appear to apply: ~ History of anxiety/depression during this pregnancy, or of post-partum depression following prior delivery. ~ Diagnosis of anxiety and/or depression within last 3 years OR * MOB's symptoms currently being treated with medication and/or therapy. Please contact the Clinical Social Worker if needs arise, by MOB request, or if MOB scores greater than 9/yes to question 10 on Edinburgh Postpartum Depression Screen.  Isael Stille Boyd-Gilyard, MSW, LCSW Clinical Social Work [phone]  

## 2018-04-17 NOTE — Progress Notes (Signed)
Patient ID: Melody Stewart, female   DOB: Nov 03, 1985, 32 y.o.   MRN: [id number] Sleeping  Vitals:   04/16/18 2300 04/16/18 2331 04/17/18 0001 04/17/18 0011  BP: (!) 119/56 134/61 (!) 144/57   Pulse: (!) 114 (!) 116 (!) 120   Resp: 18 18 18    Temp: 99.9 F (37.7 C)   99.8 F (37.7 C)  TempSrc: Axillary   Axillary  SpO2:      Weight:      Height:       FHR reactive now, baseline still around 150 but good accels now  UCs more consistent, every 2-4 min  Dilation: Fingertip Effacement (%): Thick Cervical Position: Posterior Station: Ballotable Presentation: Vertex Exam by:: C.Wallace  Foley inplace  Melody Stewart, CNM

## 2018-04-17 NOTE — Brief Op Note (Signed)
04/17/2018  10:31 AM  PATIENT:  Melody Stewart  32 y.o. female  PRE-OPERATIVE DIAGNOSIS:  CESAREAN SECTION, failed induction, choirioamniotisi, pre-E with severe featrures  POST-OPERATIVE DIAGNOSIS:  CESAREAN SECTION, failed induction, choirioamniotisi, pre-E with severe featrures  PROCEDURE:  Procedure(s): CESAREAN SECTION (N/A)  SURGEON:  Surgeon(s) and Role:    * Lavonia Drafts, MD - Primary  ANESTHESIA:   epidural  EBL: pending   BLOOD ADMINISTERED:none  DRAINS: none   LOCAL MEDICATIONS USED:  MARCAINE     SPECIMEN:  Source of Specimen:  Placenta  DISPOSITION OF SPECIMEN:  PATHOLOGY  COUNTS:  YES  TOURNIQUET:  * No tourniquets in log *  DICTATION: .Note written in EPIC  PLAN OF CARE: Admit to inpatient   PATIENT DISPOSITION:  PACU - hemodynamically stable.   Delay start of Pharmacological VTE agent (>24hrs) due to surgical blood loss or risk of bleeding: yes  Complications: none immediate  Eveleen Mcnear L. Harraway-Smith, M.D., Cherlynn June

## 2018-04-17 NOTE — Anesthesia Postprocedure Evaluation (Signed)
Anesthesia Post Note  Patient: Radiation protection practitioner  Procedure(s) Performed: CESAREAN SECTION (N/A )     Patient location during evaluation: PACU Anesthesia Type: Epidural Level of consciousness: oriented and awake and alert Pain management: pain level controlled Vital Signs Assessment: post-procedure vital signs reviewed and stable Respiratory status: spontaneous breathing, respiratory function stable and patient connected to nasal cannula oxygen Cardiovascular status: blood pressure returned to baseline and stable Postop Assessment: no headache, no backache and no apparent nausea or vomiting Anesthetic complications: no    Last Vitals:  Vitals:   04/17/18 1217 04/17/18 1311  BP: 122/75 117/69  Pulse: 90 87  Resp: 18 16  Temp: 36.9 C 37.1 C  SpO2: 90% 91%    Last Pain:  Vitals:   04/17/18 1420  TempSrc:   PainSc: 4    Pain Goal:                 Barnet Glasgow

## 2018-04-17 NOTE — Progress Notes (Signed)
Pharmacy Antibiotic Note  Melody Stewart is a 32 y.o. female admitted on 04/15/2018 with IOL 2/2 pre-eclampsia with severe features.  Pharmacy has been consulted for gentamicin for maternal fever.  Plan: Gentamicin 5mg /kg IV Q24 Will follow-up SCr and levels as clinically indicated  Height: 5\' 6"  (167.6 cm) Weight: 252 lb (114.3 kg) IBW/kg (Calculated) : 59.3  Temp (24hrs), Avg:99.3 F (37.4 C), Min:98.4 F (36.9 C), Max:101.2 F (38.4 C)  Recent Labs  Lab 04/15/18 2153 04/16/18 0932 04/16/18 1524 04/16/18 2139 04/17/18 0337  WBC 13.5* 14.4* 14.5* 14.8* 12.2*  CREATININE 1.12* 1.14* 1.00 1.07* 1.17*    Estimated Creatinine Clearance: 87.8 mL/min (A) (by C-G formula based on SCr of 1.17 mg/dL (H)).    Allergies  Allergen Reactions  . Hydrocodone Other (See Comments)    Make her sick and constipated.    Antimicrobials this admission: Ampicillin 2 grams Q6 hours  >>     Thank you for allowing pharmacy to be a part of this patient's care.  Nyra Capes 04/17/2018 7:55 AM

## 2018-04-17 NOTE — Telephone Encounter (Signed)
Lmom for pt to call us back to set up pp appointment.  04-17-18  AS

## 2018-04-17 NOTE — Lactation Note (Addendum)
This note was copied from a baby's chart. Lactation Consultation Note  Patient Name: Melody Stewart Today's Date: 04/17/2018 Reason for consult: Initial assessment;Early term 37-38.6wks;Other (Comment);Primapara;1st time breastfeeding(per mom baby is down in the nursery due to the temperature being low. / LC enc to call with feeding cues )  Baby is 5 hours old/ Early term / greater than 6 pounds.  LC reviewed potential feeding behaviors with early term infants, and the benefits of STS and enhancing Milk supply. LC gave mom the [place] hand out for Late preterm infants - and explained that early term infants  Act like earlier babies.  Per mom a DEBP at home - questionable a [place] DEBP .  RN caring for mom plans to set up the DEBP.  Mother informed of post-discharge support and given phone number to the lactation department, including services for phone call assistance; out-patient appointments; and breastfeeding support group. List of other breastfeeding resources in the community given in the handout. Encouraged mother to call for problems or concerns related to breastfeeding.   Maternal Data Has patient been taught Hand Expression?: Yes Does the patient have breastfeeding experience prior to this delivery?: No  Feeding Feeding Type: (per mom baby breast fed for short time in the recovery )  LATCH Score                   Interventions Interventions: Breast feeding basics reviewed  Lactation Tools Discussed/Used WIC Program: No   Consult Status Consult Status: Follow-up Date: 04/17/18 Follow-up type: In-patient    [place] 04/17/2018, 3:33 PM

## 2018-04-17 NOTE — Anesthesia Postprocedure Evaluation (Signed)
Anesthesia Post Note  Patient: Radiation protection practitioner  Procedure(s) Performed: CESAREAN SECTION (N/A )     Patient location during evaluation: [facility] Anesthesia Type: Epidural Level of consciousness: awake and alert Pain management: pain level controlled Vital Signs Assessment: post-procedure vital signs reviewed and stable Respiratory status: spontaneous breathing Cardiovascular status: blood pressure returned to baseline Postop Assessment: no headache, patient able to bend at knees, no backache, no apparent nausea or vomiting, epidural receding, adequate PO intake and able to ambulate Anesthetic complications: no    Last Vitals:  Vitals:   04/17/18 1311 04/17/18 1622  BP: 117/69 113/72  Pulse: 87 86  Resp: 16 18  Temp: 37.1 C 37 C  SpO2: 91% 95%    Last Pain:  Vitals:   04/17/18 1622  TempSrc: Oral  PainSc:    Pain Goal:                 [name]

## 2018-04-17 NOTE — Op Note (Signed)
04/17/2018  10:31 AM  PATIENT:  Melody Stewart  32 y.o. female  PRE-OPERATIVE DIAGNOSIS:  CESAREAN SECTION, failed induction, choirioamniotisi, pre-E with severe featrures  POST-OPERATIVE DIAGNOSIS:  CESAREAN SECTION, failed induction, choirioamniotisi, pre-E with severe featrures  PROCEDURE:  Procedure(s): CESAREAN SECTION (N/A)  SURGEON:  Surgeon(s) and Role:    * Lavonia Drafts, MD - Primary  ANESTHESIA:   epidural  EBL: 452cc  BLOOD ADMINISTERED:none  DRAINS: none   LOCAL MEDICATIONS USED:  MARCAINE     SPECIMEN:  Source of Specimen:  Placenta  DISPOSITION OF SPECIMEN:  PATHOLOGY  COUNTS:  YES  TOURNIQUET:  * No tourniquets in log *  DICTATION: .Note written in EPIC  PLAN OF CARE: Admit to inpatient   PATIENT DISPOSITION:  PACU - hemodynamically stable.   Delay start of Pharmacological VTE agent (>24hrs) due to surgical blood loss or risk of bleeding: yes  Complications: none immediate   INDICATIONS: Saron D Liz is a 32 y.o. G1P0000 at [redacted]w[redacted]d here for cesarean section secondary to the indications listed under preoperative diagnosis; please see preoperative note for further details.  The risks of cesarean section were discussed with the patient including but were not limited to: bleeding which may require transfusion or reoperation; infection which may require antibiotics; injury to bowel, bladder, ureters or other surrounding organs; injury to the fetus; need for additional procedures including hysterectomy in the event of a life-threatening hemorrhage; placental abnormalities wth subsequent pregnancies, incisional problems, thromboembolic phenomenon and other postoperative/anesthesia complications.   The patient concurred with the proposed plan, giving informed written consent for the procedure.    FINDINGS:  Viable female infant in cephalic presentation.  Apgars:  pending  Clear amniotic fluid.  Intact placenta, three vessel cord.  Normal uterus,  fallopian tubes and ovaries bilaterally.  PROCEDURE IN DETAIL:  The patient preoperatively received intravenous antibiotics and had sequential compression devices applied to her lower extremities.  She was then taken to the operating room where spinal anesthesia was administered and was found to be adequate. She was then placed in a dorsal supine position with a leftward tilt, and prepped and draped in a sterile manner.  A foley catheter was placed into her bladder and attached to constant gravity.  After an adequate timeout was performed, a Pfannenstiel skin incision was made with scalpel and carried through to the underlying layer of fascia. The fascia was incised in the midline, and this incision was extended bilaterally using the Mayo scissors.  Kocher clamps were applied to the superior aspect of the fascial incision and the underlying rectus muscles were dissected off bluntly. A similar process was carried out on the inferior aspect of the fascial incision. The rectus muscles were separated in the midline bluntly and the peritoneum was entered bluntly.  Attention was turned to the lower uterine segment where a low transverse hysterotomy incision was made with a scalpel and extended bilaterally bluntly.  The infant was successfully delivered, the cord was clamped and cut and the infant was handed over to awaiting neonatology team. The placenta was delivered manually. Uterine massage was then administered.  The placenta was intact with a three-vessel cord. The uterus was then cleared of clot and debris.  The hysterotomy was closed with 0 Vicryl in a running locked fashion, and an imbricating layer was also placed with the same suture. The pelvis was cleared of all clot and debris. Hemostasis was confirmed on all surfaces.  The peritoneum and the muscles were reapproximated using 0 Vicryl  with 1 interrupted suture. The fascia was then closed using 0 Vicryl.  The subcutaneous layer was irrigated, then  reapproximated with 3-0 vicryl in a running locked fashion, and the skin was closed with a 4-0 Vicryl subcuticular stitch.  30 cc of 0.5% marcaine was injected into the incision and benzoin and steristrips were applied.  The patient tolerated the procedure well. Sponge, lap, instrument and needle counts were correct x 2.  She was taken to the recovery room in stable condition.   Angelyne Terwilliger L. Harraway-Smith, M.D., Cherlynn June

## 2018-04-17 NOTE — Progress Notes (Signed)
Melody Stewart is a 32 y.o. G1P0000 at [redacted]w[redacted]d by LMP admitted for induction of labor due to preeclampsia with severe features.   Subjective: Pt reports that she feel horrible. She denies pain  Objective: BP (!) 129/54   Pulse (!) 105   Temp (!) 101.2 F (38.4 C) (Axillary)   Resp 17   Ht 5\' 6"  (1.676 m)   Wt 114.3 kg   LMP 07/20/2017   SpO2 97%   BMI 40.67 kg/m  I/O last 3 completed shifts: In: 7702.6 [P.O.:2410; I.V.:5292.6] Out: 5405 [Urine:5405] Total I/O In: 396.3 [I.V.:396.3] Out: 35 [Urine:35]  FHT:  FHR: 150's intermittent late deceleration. Cat II  bpm, variability: intermittedly absent and mod ,  accelerations:  Abscent,  decelerations:  Present late UC:   Pt was prev having inadequate contractions that have now become minimal. The IUPC is in place.   SVE:   Dilation: 4 Effacement (%): 70 Station: -3 Exam by:: Harraway-Smith  Labs: Lab Results  Component Value Date   WBC 12.2 (H) 04/17/2018   HGB 8.6 (L) 04/17/2018   HCT 25.7 (L) 04/17/2018   MCV 88.0 04/17/2018   PLT 266 04/17/2018    Assessment / Plan: Induction of labor due to preeclampsia,  progressing well on pitocin  Labor: pt is not progressing. Her UO has decreased and she has developed chorioamnionitis.   Preeclampsia:  on magnesium sulfate Fetal Wellbeing:  Category II Pain Control:  Epidural I/D:  revcieved Unasyn due to her Cr.   Anticipated MOD: Will proceed with operative delivery. The risks of cesarean section discussed with the patient included but were not limited to: bleeding which may require transfusion or reoperation; infection which may require antibiotics; injury to bowel, bladder, ureters or other surrounding organs; injury to the fetus; need for additional procedures including hysterectomy in the event of a life-threatening hemorrhage; placental abnormalities wth subsequent pregnancies, incisional problems, thromboembolic phenomenon and other postoperative/anesthesia complications.  The patient concurred with the proposed plan, giving informed written consent for the procedure.   Patient has been NPO since last night she will remain NPO for procedure. Anesthesia and OR aware. Preoperative prophylactic antibiotics and SCDs ordered on call to the OR.  To OR when ready.   Lavonia Drafts 04/17/2018, 9:40 AM

## 2018-04-18 ENCOUNTER — Telehealth: Payer: Self-pay | Admitting: Obstetrics & Gynecology

## 2018-04-18 DIAGNOSIS — O411290 Chorioamnionitis, unspecified trimester, not applicable or unspecified: Secondary | ICD-10-CM | POA: Diagnosis not present

## 2018-04-18 DIAGNOSIS — O1414 Severe pre-eclampsia complicating childbirth: Secondary | ICD-10-CM | POA: Diagnosis not present

## 2018-04-18 DIAGNOSIS — O411230 Chorioamnionitis, third trimester, not applicable or unspecified: Secondary | ICD-10-CM | POA: Diagnosis not present

## 2018-04-18 LAB — CBC
HCT: 24.7 % — ABNORMAL LOW (ref 36.0–46.0)
HEMOGLOBIN: 8.2 g/dL — AB (ref 12.0–15.0)
MCH: 29.1 pg (ref 26.0–34.0)
MCHC: 33.2 g/dL (ref 30.0–36.0)
MCV: 87.6 fL (ref 78.0–100.0)
PLATELETS: 315 10*3/uL (ref 150–400)
RBC: 2.82 MIL/uL — AB (ref 3.87–5.11)
RDW: 14.7 % (ref 11.5–15.5)
WBC: 15.3 10*3/uL — AB (ref 4.0–10.5)

## 2018-04-18 LAB — COMPREHENSIVE METABOLIC PANEL
ALK PHOS: 82 U/L (ref 38–126)
ALT: 12 U/L (ref 0–44)
ANION GAP: 11 (ref 5–15)
AST: 15 U/L (ref 15–41)
Albumin: 2.2 g/dL — ABNORMAL LOW (ref 3.5–5.0)
BILIRUBIN TOTAL: 0.7 mg/dL (ref 0.3–1.2)
BUN: 18 mg/dL (ref 6–20)
CALCIUM: 7.5 mg/dL — AB (ref 8.9–10.3)
CO2: 21 mmol/L — AB (ref 22–32)
CREATININE: 1.22 mg/dL — AB (ref 0.44–1.00)
Chloride: 100 mmol/L (ref 98–111)
GFR, EST NON AFRICAN AMERICAN: 58 mL/min — AB (ref >60)
Glucose, Bld: 112 mg/dL — ABNORMAL HIGH (ref 70–99)
Potassium: 3.6 mmol/L (ref 3.5–5.1)
SODIUM: 132 mmol/L — AB (ref 135–145)
TOTAL PROTEIN: 5.2 g/dL — AB (ref 6.5–8.1)

## 2018-04-18 MED ORDER — FERROUS GLUCONATE 324 (38 FE) MG PO TABS
324.0000 mg | ORAL_TABLET | Freq: Two times a day (BID) | ORAL | Status: DC
  Administered 2018-04-18 – 2018-04-20 (×5): 324 mg via ORAL
  Filled 2018-04-18 (×7): qty 1

## 2018-04-18 MED ORDER — CEPHALEXIN 500 MG PO CAPS
500.0000 mg | ORAL_CAPSULE | Freq: Every day | ORAL | Status: DC
  Administered 2018-04-18 – 2018-04-19 (×2): 500 mg via ORAL
  Filled 2018-04-18 (×2): qty 1

## 2018-04-18 MED ORDER — POLYETHYLENE GLYCOL 3350 17 G PO PACK
17.0000 g | PACK | Freq: Every day | ORAL | Status: DC
  Administered 2018-04-18 – 2018-04-19 (×2): 17 g via ORAL
  Filled 2018-04-18 (×2): qty 1

## 2018-04-18 NOTE — Telephone Encounter (Signed)
Urologist called

## 2018-04-18 NOTE — Progress Notes (Addendum)
Daily Postpartum Note  Admission Date: 04/15/2018 Current Date: 04/18/2018 11:11 AM  Melody Stewart is a 32 y.o. G1P1 POD#1 s/p pLTCS for FITL @ [redacted]w[redacted]d Pregnancy complicated by: Patient Active Problem List   Diagnosis Date Noted  . Chorioamnionitis 04/18/2018  . Severe preeclampsia, third trimester 04/16/2018  . Kidney stone complicating pregnancy, third trimester 03/15/2018  . AKI (acute kidney injury) ([place] 03/15/2018  . Tinea corporis 02/19/2018  . Kidney stone complicating pregnancy, second trimester 01/27/2018  . Persistent UTI during pregnancy 09/30/2017  . Supervision of normal first pregnancy 09/25/2017  . Abnormal Pap smear of cervix 05/25/2016  . Anxiety and depression 09/14/2015    Overnight/24hr events:  none  Subjective:  No s/s of pre-eclampsia. No flatus yet.   Objective:    Current Vital Signs 24h Vital Sign Ranges  T 98.3 F (36.8 C) Temp  Avg: 98.5 F (36.9 C)  Min: 97.7 F (36.5 C)  Max: 98.8 F (37.1 C)  BP 102/61 BP  Min: 93/53  Max: 122/75  HR 86 Pulse  Avg: 88.9  Min: 84  Max: 96  RR 16 Resp  Avg: 18  Min: 16  Max: 25  SaO2 95 % Room Air SpO2  Avg: 93.4 %  Min: 90 %  Max: 100 %       24 Hour I/O Current Shift I/O  Time Ins Outs 09/26 0701 - 09/27 0700 In: 2889 [P.O.:1025; I.V.:1864] Out: 2237 [Urine:1785] 09/27 0701 - 09/27 1900 In: 497.5 [P.O.:60; I.V.:337.5] Out: 14627[Urine:1025]   Patient Vitals for the past 24 hrs:  BP Temp Temp src Pulse Resp SpO2  04/18/18 0809 102/61 98.3 F (36.8 C) Axillary 86 16 95 %  04/18/18 0600 - - - - 16 -  04/18/18 0500 - - - - 18 -  04/18/18 0400 - - - - 18 -  04/18/18 0303 (!) 93/53 97.7 F (36.5 C) Oral 84 18 100 %  04/17/18 1923 101/65 98.8 F (37.1 C) Oral 85 18 93 %  04/17/18 1622 113/72 98.6 F (37 C) Oral 86 18 95 %  04/17/18 1311 117/69 98.8 F (37.1 C) Oral 87 16 91 %  04/17/18 1217 122/75 98.5 F (36.9 C) Oral 90 18 90 %  04/17/18 1145 (!) 111/94 - - 95 18 92 %  04/17/18 1130 111/65  - - 91 17 93 %  04/17/18 1115 115/80 - - 96 (!) 25 92 %   UOP: >1061mhr Physical exam: General: Well nourished, well developed female in no acute distress. Abdomen: soft, nttp, mildly distended Cardiovascular: S1, S2 normal, no murmur, rub or gallop, regular rate and rhythm Respiratory: CTAB Extremities: no clubbing, cyanosis or edema Skin: Warm and dry.   Medications: Current Facility-Administered Medications  Medication Dose Route Frequency Provider Last Rate Last Dose  . acetaminophen (TYLENOL) tablet 650 mg  650 mg Oral Q4H PRN HaLavonia DraftsMD      . coconut oil  1 application Topical PRN HaLavonia DraftsMD   1 application at 0903/50/09324  . witch hazel-glycerin (TUCKS) pad 1 application  1 application Topical PRN HaLavonia DraftsMD       And  . dibucaine (NUPERCAINAL) 1 % rectal ointment 1 application  1 application Rectal PRN HaLavonia DraftsMD      . diphenhydrAMINE (BENADRYL) capsule 25 mg  25 mg Oral Q6H PRN HaLavonia DraftsMD      . diphenhydrAMINE (BENADRYL) injection 12.5 mg  12.5 mg Intravenous Q4H PRN HoBarnet Glasgow  MD       Or  . diphenhydrAMINE (BENADRYL) capsule 25 mg  25 mg Oral Q4H PRN Barnet Glasgow, MD      . enoxaparin (LOVENOX) injection 55 mg  0.5 mg/kg Subcutaneous Q24H Lavonia Drafts, MD   55 mg at 04/18/18 1026  . Influenza vac split quadrivalent PF (FLUARIX) injection 0.5 mL  0.5 mL Intramuscular Tomorrow-1000 Lavonia Drafts, MD   Stopped at 04/18/18 1000  . ketorolac (TORADOL) 30 MG/ML injection 30 mg  30 mg Intravenous Q6H PRN Barnet Glasgow, MD       Or  . ketorolac (TORADOL) 30 MG/ML injection 30 mg  30 mg Intramuscular Q6H PRN Barnet Glasgow, MD      . measles, mumps and rubella vaccine (MMR) injection 0.5 mL  0.5 mL Subcutaneous Once Lavonia Drafts, MD      . menthol-cetylpyridinium (CEPACOL) lozenge 3 mg  1 lozenge Oral Q2H PRN Lavonia Drafts, MD   3 mg  at 04/18/18 2563  . nalbuphine (NUBAIN) injection 5 mg  5 mg Intravenous Q4H PRN Barnet Glasgow, MD       Or  . nalbuphine (NUBAIN) injection 5 mg  5 mg Subcutaneous Q4H PRN Barnet Glasgow, MD      . nalbuphine (NUBAIN) injection 5 mg  5 mg Intravenous Once PRN Barnet Glasgow, MD       Or  . nalbuphine (NUBAIN) injection 5 mg  5 mg Subcutaneous Once PRN Barnet Glasgow, MD      . naloxone HCl (NARCAN) 2 mg in dextrose 5 % 250 mL infusion  1-4 mcg/kg/hr Intravenous Continuous PRN Barnet Glasgow, MD      . ondansetron (ZOFRAN) injection 4 mg  4 mg Intravenous Q6H PRN Aletha Halim, MD   4 mg at 04/17/18 1725  . oxyCODONE-acetaminophen (PERCOCET/ROXICET) 5-325 MG per tablet 1 tablet  1 tablet Oral Q4H PRN Lavonia Drafts, MD      . oxyCODONE-acetaminophen (PERCOCET/ROXICET) 5-325 MG per tablet 2 tablet  2 tablet Oral Q4H PRN Lavonia Drafts, MD   2 tablet at 04/18/18 0811  . prenatal multivitamin tablet 1 tablet  1 tablet Oral Q1200 Lavonia Drafts, MD      . scopolamine (TRANSDERM-SCOP) 1 MG/3DAYS 1.5 mg  1 patch Transdermal Once Barnet Glasgow, MD      . senna-docusate (Senokot-S) tablet 2 tablet  2 tablet Oral QHS PRN Aletha Halim, MD      . simethicone (MYLICON) chewable tablet 80 mg  80 mg Oral TID Leonie Man, MD   80 mg at 04/18/18 0811    Labs:  Recent Labs  Lab 04/17/18 0337 04/17/18 0910 04/18/18 0426  WBC 12.2* 12.2* 15.3*  HGB 8.7* 8.6* 8.2*  HCT 25.9* 25.7* 24.7*  PLT 290 266 315    Recent Labs  Lab 04/17/18 1535 04/17/18 2156 04/18/18 0426  NA 129* 131* 132*  K 3.9 3.6 3.6  CL 99 101 100  CO2 19* 19* 21*  BUN '17 18 18  ' CREATININE 1.20* 1.22* 1.22*  CALCIUM 7.6* 7.5* 7.5*  PROT 5.4* 5.4* 5.2*  BILITOT 0.9 0.7 0.7  ALKPHOS 88 84 82  ALT '13 11 12  ' AST '20 19 15  ' GLUCOSE 145* 136* 112*     Radiology: no new imaging  Assessment & Plan:  Pt doing well *PP: routine care. D/w pt re: BC  tomorrow *severe pre-eclampsia: normal BPs on no meds. Making great UOP. Mg to come off this morning *  Renal: pt was to follow up with urology on Wednesday. I told her to call them today and let them know she delivered. Will rpt bmp tomorrow *Anemia: follow s/s today. Start iron *Chorio: to receive 24h of abx post op *PPx: lovenox, SCDs *FEN/GI: regular diet. SLIV when Mg done *Dispo: likely pod#3  Durene Romans. MD Attending Center for [facility])

## 2018-04-18 NOTE — Lactation Note (Signed)
This note was copied from a baby's chart. Lactation Consultation Note  Patient Name: Melody Stewart Today's Date: 04/18/2018 Reason for consult: Follow-up assessment;Early term 37-38.6wks;1st time breastfeeding;Primapara  57 hours old early term female who is being partially BF and formula fed by her mother, she's a P1. Baby is on Gerber gentle. Mom inquired about having a DEBP set up into her room, LC saw that the DEBP kit was brought but not the Symphony pump, no pump in the room at the time of [facility] consultation. Mom also brought her own DEBP when she was asked which one would she prefer to use, she ask LC: which one do you recommend? LC recommended a hospital grade pump while in the hospital. Coconut oil was also brought to the room, instructed mom how to use it prior pumping.  Offered assistance with latch but mom politely declined stating the baby already fed, she was doing STS with baby when entering the room. Per mom feedings at the breast are comfortable; she states it feels "like a tug" but mom unable to hear swallows when baby is feeding. Baby is also getting supplementing with Gerber gentle, 10 ml at a time, reviewed formula supplementation guidelines and mom is aware that the amount of formula given will be increasing as baby gets older, and once she starts pumping and getting volume it will also get replaced by her own EBM.  Plan  1. Encouraged mom to feed baby at the breast STS 8-12 times/24 hours or sooner if feeding cues are present 2. Mom will start pumping tonight. RN will be setting up symphony pump, mom will pump every 3 hours during the daytime and at least once at night; a minimum of 6 pumping sessions in a 24 hours period. 3. She'll feed any amount of EBM obtained through pumping first 3. Mom will supplement baby with gerber gentle afterwards according the formula supplementation guidelines.  Mom reported all her questions were answered, she's aware of [place] services and will  call PRN.  Maternal Data    Feeding    LATCH Score                   Interventions Interventions: Breast feeding basics reviewed;Coconut oil;DEBP  Lactation Tools Discussed/Used     Consult Status Consult Status: Follow-up Date: 04/18/18 Follow-up type: In-patient    Amyr Sluder Francene Boyers 04/18/2018, 7:00 PM

## 2018-04-19 DIAGNOSIS — O1414 Severe pre-eclampsia complicating childbirth: Secondary | ICD-10-CM | POA: Diagnosis not present

## 2018-04-19 DIAGNOSIS — O411230 Chorioamnionitis, third trimester, not applicable or unspecified: Secondary | ICD-10-CM | POA: Diagnosis not present

## 2018-04-19 LAB — CBC
HCT: 21.9 % — ABNORMAL LOW (ref 36.0–46.0)
HEMATOCRIT: 24.5 % — AB (ref 36.0–46.0)
HEMOGLOBIN: 7.3 g/dL — AB (ref 12.0–15.0)
Hemoglobin: 8.1 g/dL — ABNORMAL LOW (ref 12.0–15.0)
MCH: 29.4 pg (ref 26.0–34.0)
MCH: 29.6 pg (ref 26.0–34.0)
MCHC: 33.3 g/dL (ref 30.0–36.0)
MCHC: 33.1 g/dL (ref 30.0–36.0)
MCV: 88.3 fL (ref 78.0–100.0)
MCV: 89.4 fL (ref 78.0–100.0)
PLATELETS: 290 10*3/uL (ref 150–400)
Platelets: 342 10*3/uL (ref 150–400)
RBC: 2.48 MIL/uL — AB (ref 3.87–5.11)
RBC: 2.74 MIL/uL — AB (ref 3.87–5.11)
RDW: 14.8 % (ref 11.5–15.5)
RDW: 14.7 % (ref 11.5–15.5)
WBC: 9.5 10*3/uL (ref 4.0–10.5)
WBC: 9.9 10*3/uL (ref 4.0–10.5)

## 2018-04-19 LAB — BASIC METABOLIC PANEL
Anion gap: 8 (ref 5–15)
BUN: 20 mg/dL (ref 6–20)
CO2: 23 mmol/L (ref 22–32)
CREATININE: 1.22 mg/dL — AB (ref 0.44–1.00)
Calcium: 7.9 mg/dL — ABNORMAL LOW (ref 8.9–10.3)
Chloride: 105 mmol/L (ref 98–111)
GFR calc Af Amer: >60 mL/min (ref >60)
GFR calc non Af Amer: 58 mL/min — ABNORMAL LOW (ref >60)
Glucose, Bld: 93 mg/dL (ref 70–99)
POTASSIUM: 3.9 mmol/L (ref 3.5–5.1)
Sodium: 136 mmol/L (ref 135–145)

## 2018-04-19 MED ORDER — FUROSEMIDE 10 MG/ML IJ SOLN
20.0000 mg | Freq: Once | INTRAMUSCULAR | Status: AC
  Administered 2018-04-19: 20 mg via INTRAVENOUS
  Filled 2018-04-19: qty 2

## 2018-04-19 NOTE — Lactation Note (Signed)
This note was copied from a baby's chart. Lactation Consultation Note  Patient Name: Melody Stewart Today's Date: 04/19/2018 Reason for consult: Initial assessment;Early term 37-38.6wks;1st time breastfeeding;Primapara;Infant weight loss  50 hours old early term female who is still being partially BF and formula fed by her mother; she's a P1. Mom did not get a DEBP set up in her room last night as planned, she voiced her RN didn't set it up until this morning around 10 am, when she requested for it. Mom already started pumping and she's aware she's not supposed to get volume yet that all the pumping at this point is for stimulation at the breast. Mom did not get any volume on her first pumping session other than some condensation/colostrum on the flanges.  LC noticed that the junctures on the pump were loose and adjusted them, let mom know that she'll feel a difference in suction level the next time she pumps. When offered assistance with latch, mom declined again stating that baby already fed Gerber formula at noon. Asked mom to call for assistance when needed. She's still trying to put her to the breast but she's not believing baby is transferring yet, but now she's at least "sucking some". She's also been hand expressing prior latching to give baby a few drops of colostrum. Mom had some questions, discusses cluster feeding and baby sleeping cycle.  Feeding Plan  1. Encouraged mom to feed baby at the breast STS 8-12 times/24 hours or sooner if feeding cues are present 2. Continue pumping every 3 hours during the daytime and at least once at night; a minimum of 6 pumping sessions in a 24 hours period. 3. She'll feed any amount of EBM obtained through pumping first, before offering formula 3. Mom will supplement baby with gerber gentle afterwards according the formula supplementation guidelines.  Mom reported all her questions were answered, she's aware of [place] services and will call  PRN.  Maternal Data    Feeding      Interventions Interventions: Breast feeding basics reviewed;DEBP  Lactation Tools Discussed/Used Tools: Pump Breast pump type: Double-Electric Breast Pump Pump Review: Setup, frequency, and cleaning;Milk Storage Initiated by:: RN and IBCLC Date initiated:: 04/19/18   Consult Status Consult Status: Follow-up Date: 04/20/18 Follow-up type: In-patient    Melody Stewart Francene Boyers 04/19/2018, 12:03 PM

## 2018-04-19 NOTE — Progress Notes (Signed)
Subjective: Postpartum Day 2: Cesarean Delivery Patient reports doing well no problems, ambulating without difficulty.    Objective: Vital signs in last 24 hours: Temp:  [98.3 F (36.8 C)-98.9 F (37.2 C)] 98.9 F (37.2 C) (09/28 0808) Pulse Rate:  [82-102] 90 (09/28 0808) Resp:  [16-18] 18 (09/28 0808) BP: (100-129)/(45-65) 129/60 (09/28 0808) SpO2:  [94 %-100 %] 100 % (09/28 7955)  Physical Exam:  General: alert, cooperative and no distress Lochia: appropriate Uterine Fundus: firm Incision: dry dressing looks godd DVT Evaluation: No evidence of DVT seen on physical exam.  Recent Labs    04/18/18 0426 04/19/18 0646  HGB 8.2* 7.3*  HCT 24.7* 21.9*    Assessment/Plan: Status post Cesarean section. Doing well postoperatively.  Continue current care. Anemia, recheck  In am,  Lasix for significant edema anticipate discharge tomorrow  Florian Buff 04/19/2018, 8:57 AM

## 2018-04-20 DIAGNOSIS — O411230 Chorioamnionitis, third trimester, not applicable or unspecified: Secondary | ICD-10-CM | POA: Diagnosis not present

## 2018-04-20 DIAGNOSIS — O1414 Severe pre-eclampsia complicating childbirth: Secondary | ICD-10-CM | POA: Diagnosis not present

## 2018-04-20 MED ORDER — TRIAMTERENE-HCTZ 37.5-25 MG PO CAPS: 1.0000 | ORAL_CAPSULE | Freq: Every day | ORAL | 0 refills | Status: DC

## 2018-04-20 MED ORDER — HYDROMORPHONE HCL 2 MG PO TABS: 2.0000 mg | ORAL_TABLET | ORAL | 0 refills | Status: DC | PRN

## 2018-04-20 NOTE — Lactation Note (Signed)
This note was copied from a baby's chart. Lactation Consultation Note  Patient Name: Melody Stewart Today's Date: 04/20/2018   Baby 4 hours old and sleeping under phototherapy. Mother is breastfeeding and pumping after.  She is excited because she pumped 30 ml this morning. She states baby cluster fed last night. Mother has personal DEBP for at home use. Reviewed engorgement care and monitoring voids/stools. Mom encouraged to feed baby 8-12 times/24 hours and with feeding cues.       Maternal Data    Feeding Length of feed: 30 min  LATCH Score                   Interventions    Lactation Tools Discussed/Used     Consult Status      Carlye Grippe 04/20/2018, 12:07 PM

## 2018-04-20 NOTE — Discharge Summary (Signed)
Physician Discharge Summary  Patient ID: Melody Stewart MRN: [id number] DOB/AGE: 33/27/1987 33 y.o.  Admit date: 04/15/2018 Discharge date: 04/20/2018  Admission Diagnoses: 37+ weeks, severe pre eclampsia Renal calculus with bilateral stents  Discharge Diagnoses:  Active Problems:   Severe preeclampsia, third trimester   Chorioamnionitis s/p primary Caesarean section  Discharged Condition: stable  Hospital Course: admitted with severe pre eclmapisa fo induction Ultimately had primary section for NR FHT(FITL) Post op course unremarkable  Consults: None  Significant Diagnostic Studies: labs:   Results for orders placed or performed during the hospital encounter of 04/15/18 (from the past 72 hour(s))  Magnesium     Status: Abnormal   Collection Time: 04/17/18  3:35 PM  Result Value Ref Range   Magnesium 3.8 (H) 1.7 - 2.4 mg/dL    Comment: Performed at [facility], [address] [place], [place] [address]  Comprehensive metabolic panel     Status: Abnormal   Collection Time: 04/17/18  3:35 PM  Result Value Ref Range   Sodium 129 (L) 135 - 145 mmol/L   Potassium 3.9 3.5 - 5.1 mmol/L   Chloride 99 98 - 111 mmol/L   CO2 19 (L) 22 - 32 mmol/L   Glucose, Bld 145 (H) 70 - 99 mg/dL   BUN 17 6 - 20 mg/dL   Creatinine, Ser 1.20 (H) 0.44 - 1.00 mg/dL   Calcium 7.6 (L) 8.9 - 10.3 mg/dL   Total Protein 5.4 (L) 6.5 - 8.1 g/dL   Albumin 2.2 (L) 3.5 - 5.0 g/dL   AST 20 15 - 41 U/L   ALT 13 0 - 44 U/L   Alkaline Phosphatase 88 38 - 126 U/L   Total Bilirubin 0.9 0.3 - 1.2 mg/dL   GFR calc non Af Amer 59 (L) >60 mL/min   GFR calc Af Amer >60 >60 mL/min    Comment: (NOTE) The eGFR has been calculated using the CKD EPI equation. This calculation has not been validated in all clinical situations. eGFR's persistently <60 mL/min signify possible Chronic Kidney Disease.    Anion gap 11 5 - 15    Comment: Performed at [facility], [address], [place], [place] [address]   Magnesium     Status: Abnormal   Collection Time: 04/17/18  9:56 PM  Result Value Ref Range   Magnesium 4.3 (H) 1.7 - 2.4 mg/dL    Comment: Performed at [facility], [address] [place], [place] [address]  Comprehensive metabolic panel     Status: Abnormal   Collection Time: 04/17/18  9:56 PM  Result Value Ref Range   Sodium 131 (L) 135 - 145 mmol/L   Potassium 3.6 3.5 - 5.1 mmol/L   Chloride 101 98 - 111 mmol/L   CO2 19 (L) 22 - 32 mmol/L   Glucose, Bld 136 (H) 70 - 99 mg/dL   BUN 18 6 - 20 mg/dL   Creatinine, Ser 1.22 (H) 0.44 - 1.00 mg/dL   Calcium 7.5 (L) 8.9 - 10.3 mg/dL   Total Protein 5.4 (L) 6.5 - 8.1 g/dL   Albumin 2.2 (L) 3.5 - 5.0 g/dL   AST 19 15 - 41 U/L   ALT 11 0 - 44 U/L   Alkaline Phosphatase 84 38 - 126 U/L   Total Bilirubin 0.7 0.3 - 1.2 mg/dL   GFR calc non Af Amer 58 (L) >60 mL/min   GFR calc Af Amer >60 >60 mL/min    Comment: (NOTE) The eGFR has been calculated using  the CKD EPI equation. This calculation has not been validated in all clinical situations. eGFR's persistently <60 mL/min signify possible Chronic Kidney Disease.    Anion gap 11 5 - 15    Comment: Performed at [facility], [address] [place], [place] [address]  Comprehensive metabolic panel     Status: Abnormal   Collection Time: 04/18/18  4:26 AM  Result Value Ref Range   Sodium 132 (L) 135 - 145 mmol/L   Potassium 3.6 3.5 - 5.1 mmol/L   Chloride 100 98 - 111 mmol/L   CO2 21 (L) 22 - 32 mmol/L   Glucose, Bld 112 (H) 70 - 99 mg/dL   BUN 18 6 - 20 mg/dL   Creatinine, Ser 1.22 (H) 0.44 - 1.00 mg/dL   Calcium 7.5 (L) 8.9 - 10.3 mg/dL   Total Protein 5.2 (L) 6.5 - 8.1 g/dL   Albumin 2.2 (L) 3.5 - 5.0 g/dL   AST 15 15 - 41 U/L   ALT 12 0 - 44 U/L   Alkaline Phosphatase 82 38 - 126 U/L   Total Bilirubin 0.7 0.3 - 1.2 mg/dL   GFR calc non Af Amer 58 (L) >60 mL/min   GFR calc Af Amer >60 >60 mL/min    Comment: (NOTE) The eGFR has been calculated using the CKD EPI  equation. This calculation has not been validated in all clinical situations. eGFR's persistently <60 mL/min signify possible Chronic Kidney Disease.    Anion gap 11 5 - 15    Comment: Performed at [facility], [address] [place], [place] [address]  CBC     Status: Abnormal   Collection Time: 04/18/18  4:26 AM  Result Value Ref Range   WBC 15.3 (H) 4.0 - 10.5 K/uL   RBC 2.82 (L) 3.87 - 5.11 MIL/uL   Hemoglobin 8.2 (L) 12.0 - 15.0 g/dL   HCT 24.7 (L) 36.0 - 46.0 %   MCV 87.6 78.0 - 100.0 fL   MCH 29.1 26.0 - 34.0 pg   MCHC 33.2 30.0 - 36.0 g/dL   RDW 14.7 11.5 - 15.5 %   Platelets 315 150 - 400 K/uL    Comment: Performed at [facility], [address] [place], [place] [address]  CBC     Status: Abnormal   Collection Time: 04/19/18  6:46 AM  Result Value Ref Range   WBC 9.5 4.0 - 10.5 K/uL   RBC 2.48 (L) 3.87 - 5.11 MIL/uL   Hemoglobin 7.3 (L) 12.0 - 15.0 g/dL   HCT 21.9 (L) 36.0 - 46.0 %   MCV 88.3 78.0 - 100.0 fL   MCH 29.4 26.0 - 34.0 pg   MCHC 33.3 30.0 - 36.0 g/dL   RDW 14.8 11.5 - 15.5 %   Platelets 290 150 - 400 K/uL    Comment: Performed at [facility], [address] [place], [place] [address]  Basic metabolic panel     Status: Abnormal   Collection Time: 04/19/18  6:46 AM  Result Value Ref Range   Sodium 136 135 - 145 mmol/L   Potassium 3.9 3.5 - 5.1 mmol/L   Chloride 105 98 - 111 mmol/L   CO2 23 22 - 32 mmol/L   Glucose, Bld 93 70 - 99 mg/dL   BUN 20 6 - 20 mg/dL   Creatinine, Ser 1.22 (H) 0.44 - 1.00 mg/dL   Calcium 7.9 (L) 8.9 - 10.3 mg/dL   GFR calc non Af Amer 58 (L) >60 mL/min  GFR calc Af Amer >60 >60 mL/min    Comment: (NOTE) The eGFR has been calculated using the CKD EPI equation. This calculation has not been validated in all clinical situations. eGFR's persistently <60 mL/min signify possible Chronic Kidney Disease.    Anion gap 8 5 - 15    Comment: Performed at [facility], [address], [place], [place] [address]   CBC     Status: Abnormal   Collection Time: 04/19/18 11:12 AM  Result Value Ref Range   WBC 9.9 4.0 - 10.5 K/uL   RBC 2.74 (L) 3.87 - 5.11 MIL/uL   Hemoglobin 8.1 (L) 12.0 - 15.0 g/dL   HCT 24.5 (L) 36.0 - 46.0 %   MCV 89.4 78.0 - 100.0 fL   MCH 29.6 26.0 - 34.0 pg   MCHC 33.1 30.0 - 36.0 g/dL   RDW 14.7 11.5 - 15.5 %   Platelets 342 150 - 400 K/uL    Comment: Performed at [facility], [address], [place], [place] [address]    Treatments: surgery: primary Caesarean section  Discharge Exam: Blood pressure (!) 117/56, pulse 86, temperature 98.6 F (37 C), temperature source Oral, resp. rate 20, height _0  (1.676 m), weight 114.3 kg, last menstrual period 07/20/2017, SpO2 95 %. General appearance: alert, cooperative and no distress GI: soft, non-tender; bowel sounds normal; no masses,  no organomegaly Extremities: Homans sign is negative, no sign of DVT Incision/Wound:clean dry intact 3+ edema  Disposition: Discharge disposition: 01-Home or Self Care       Discharge Instructions    Call MD for:  persistant nausea and vomiting   Complete by:  As directed    Call MD for:  severe uncontrolled pain   Complete by:  As directed    Call MD for:  temperature >100.4   Complete by:  As directed    Diet - low sodium heart healthy   Complete by:  As directed    Driving Restrictions   Complete by:  As directed    No driving for 1 week   Increase activity slowly   Complete by:  As directed    Leave dressing on - Keep it clean, dry, and intact until clinic visit   Complete by:  As directed    Lifting restrictions   Complete by:  As directed    Do not lift more than 10 pounds   Sexual Activity Restrictions   Complete by:  As directed    No sex for 6 weeks     Allergies as of 04/20/2018      Reactions   Hydrocodone Other (See Comments)   Make her sick and constipated.      Medication List    STOP taking these medications   acetaminophen 325 MG  tablet Commonly known as:  TYLENOL   phenazopyridine 200 MG tablet Commonly known as:  PYRIDIUM     TAKE these medications   cephALEXin 500 MG capsule Commonly known as:  KEFLEX Take 1 capsule (500 mg total) by mouth at bedtime. What changed:  additional instructions   DHA PO Take 1 tablet by mouth daily.   HYDROmorphone 2 MG tablet Commonly known as:  DILAUDID Take 1 tablet (2 mg total) by mouth every 6 (six) hours as needed for severe pain. What changed:  Another medication with the same name was added. Make sure you understand how and when to take each.   HYDROmorphone 2 MG tablet Commonly known as:  DILAUDID Take 1  tablet (2 mg total) by mouth every 4 (four) hours as needed for severe pain. What changed:  You were already taking a medication with the same name, and this prescription was added. Make sure you understand how and when to take each.   PRENATAL VITAMIN PO Take 1 tablet by mouth daily.   PROBIOTIC DAILY PO Take 1 tablet by mouth daily.   promethazine 25 MG tablet Commonly known as:  PHENERGAN Take 0.5-1 tablets (12.5-25 mg total) by mouth every 6 (six) hours as needed for nausea. What changed:  how much to take   triamterene-hydrochlorothiazide 37.5-25 MG capsule Commonly known as:  DYAZIDE Take 1 each (1 capsule total) by mouth daily.   VENTOLIN HFA 108 (90 Base) MCG/ACT inhaler Generic drug:  albuterol Inhale 2 puffs into the lungs every 6 (six) hours as needed for wheezing.      Follow-up Information    [facility] Follow up on 04/24/2018.   Why:  BP incision check Contact information: [address] [place] [address] [phone]          Signed: Florian Buff 04/20/2018, 2:02 PM

## 2018-04-20 NOTE — Discharge Instructions (Signed)

## 2018-04-20 NOTE — Progress Notes (Signed)
Now a baby patient. Mom provided a bottle of motrin from pharmacy. Family to go get prescriptions and patient home meds for her. Mom aware she can still order meals.

## 2018-04-21 ENCOUNTER — Ambulatory Visit: Payer: Self-pay

## 2018-04-21 NOTE — Lactation Note (Addendum)
This note was copied from a baby's chart. Lactation Consultation Note; Mom reports baby was nursing well yesterday but has had trouble getting latched during the night. Reports breasts are feeling very full and lumpy. Reviewed engorgement prevention and treatment. Baby bottle fed EBM during the night. Has Medela pump for home. Encouraged massage and hand expression before latching baby and breast compression during feedings. [place] mother changed diaper. Attempted to latch baby but she was too sleepy. No questions at present. Reviewed our phone number, OP appointments and BFSG as resources for support after DC. Asking for manual pump- given. Reviewed instructions for setup, use and cleaning of pump pieces. To call prn  Patient Name: Melody Stewart [id number] Date: 04/21/2018 Reason for consult: Follow-up assessment;Early term 37-38.6wks   Maternal Data Formula Feeding for Exclusion: No Has patient been taught Hand Expression?: Yes Does the patient have breastfeeding experience prior to this delivery?: No  Feeding Feeding Type: Breast Fed Nipple Type: Slow - flow  LATCH Score Latch: Too sleepy or reluctant, no latch achieved, no sucking elicited.  Audible Swallowing: None  Type of Nipple: Everted at rest and after stimulation  Comfort (Breast/Nipple): Filling, red/small blisters or bruises, mild/mod discomfort  Hold (Positioning): Assistance needed to correctly position infant at breast and maintain latch.  LATCH Score: 4  Interventions    Lactation Tools Discussed/Used WIC Program: No   Consult Status Consult Status: Complete    Truddie Crumble 04/21/2018, 8:18 AM

## 2018-04-24 ENCOUNTER — Encounter: Payer: Self-pay | Admitting: Advanced Practice Midwife

## 2018-04-24 ENCOUNTER — Ambulatory Visit (INDEPENDENT_AMBULATORY_CARE_PROVIDER_SITE_OTHER): Payer: Federal, State, Local not specified - PPO | Admitting: Advanced Practice Midwife

## 2018-04-24 ENCOUNTER — Other Ambulatory Visit: Payer: Self-pay | Admitting: Urology

## 2018-04-24 ENCOUNTER — Other Ambulatory Visit: Payer: Self-pay

## 2018-04-24 VITALS — BP 140/81 | HR 106 | Ht 66.0 in | Wt 234.0 lb

## 2018-04-24 DIAGNOSIS — O1415 Severe pre-eclampsia, complicating the puerperium: Secondary | ICD-10-CM | POA: Diagnosis not present

## 2018-04-24 DIAGNOSIS — O1414 Severe pre-eclampsia complicating childbirth: Secondary | ICD-10-CM

## 2018-04-24 DIAGNOSIS — Z9889 Other specified postprocedural states: Secondary | ICD-10-CM

## 2018-04-24 MED ORDER — NORETHINDRONE 0.35 MG PO TABS: 1.0000 | ORAL_TABLET | Freq: Every day | ORAL | 11 refills | Status: DC

## 2018-04-24 MED ORDER — TRIAMTERENE-HCTZ 37.5-25 MG PO CAPS: 1.0000 | ORAL_CAPSULE | Freq: Every day | ORAL | 2 refills | Status: DC

## 2018-04-24 NOTE — Progress Notes (Signed)
[facility] Visit  Patient name: Melody Stewart MRN [id number]  Date of birth: [birth year]  CC & HPI:  Melody Stewart is a 32 y.o.  female presenting today for BP/incision check  She had PLTCS a week ago for failed IOL. Also had severe PreE, sent home on Maxide.Getting uretal stent exchanged 10/15   Pertinent History Reviewed:  Medical & Surgical Hx:   Past Medical History:  Diagnosis Date  . Anxiety and depression 09/14/2015  . Asthma   . BV (bacterial vaginosis) 02/24/2016  . Colitis   . Fibroadenoma of right breast   . GERD (gastroesophageal reflux disease)   . IBS (irritable bowel syndrome)   . Nephrolithiasis   . Screening for STD (sexually transmitted disease) 09/14/2015  . UTI (urinary tract infection)   . Vaginal itching 09/14/2015   Past Surgical History:  Procedure Laterality Date  . CESAREAN SECTION N/A 04/17/2018   Procedure: CESAREAN SECTION;  Surgeon: Lavonia Drafts, MD;  Location: [facility];  Service: Obstetrics;  Laterality: N/A;  . CYSTOSCOPY W/ URETERAL STENT PLACEMENT Bilateral 03/16/2018   Procedure: CYSTOSCOPY WITH RETROGRADE PYELOGRAM/URETERAL STENT PLACEMENT;  Surgeon: Festus Aloe, MD;  Location: [facility];  Service: Urology;  Laterality: Bilateral;  . KNEE SURGERY    . TONSILLECTOMY    . WISDOM TOOTH EXTRACTION     Family History  Problem Relation Age of Onset  . Hypertension Mother   . Other Mother        mother had child hydrocephic-died  . Hypertension Father   . Heart disease Paternal Grandfather   . Stroke Paternal Grandfather   . Thyroid disease Maternal Grandmother   . Heart disease Maternal Grandmother   . Cancer Paternal Grandmother        breast  . Thyroid disease Maternal Aunt   . Cancer Paternal Aunt        breast    Current Outpatient Medications:  .  Docosahexaenoic Acid (DHA PO), Take 1 tablet by mouth daily. , Disp: , Rfl:  .  HYDROmorphone (DILAUDID) 2 MG tablet, Take 1 tablet (2 mg total) by mouth  every 4 (four) hours as needed for severe pain., Disp: 30 tablet, Rfl: 0 .  Prenatal Vit-Fe Fumarate-FA (PRENATAL VITAMIN PO), Take 1 tablet by mouth daily. , Disp: , Rfl:  .  Probiotic Product (PROBIOTIC DAILY PO), Take 1 tablet by mouth daily., Disp: , Rfl:  .  triamterene-hydrochlorothiazide (DYAZIDE) 37.5-25 MG capsule, Take 1 each (1 capsule total) by mouth daily., Disp: 10 capsule, Rfl: 0 .  VENTOLIN HFA 108 (90 Base) MCG/ACT inhaler, Inhale 2 puffs into the lungs every 6 (six) hours as needed for wheezing. , Disp: , Rfl:  .  norethindrone (ORTHO MICRONOR) 0.35 MG tablet, Take 1 tablet (0.35 mg total) by mouth daily. Start the Sunday after the baby turns 66 weeks old, Disp: 1 Package, Rfl: 11 .  promethazine (PHENERGAN) 25 MG tablet, Take 0.5-1 tablets (12.5-25 mg total) by mouth every 6 (six) hours as needed for nausea. (Patient not taking: Reported on 04/24/2018), Disp: 30 tablet, Rfl: 2 Social History: Reviewed -  reports that she has quit smoking. Her smoking use included cigarettes. She has never used smokeless tobacco.  Review of Systems:   Constitutional: Negative for fever and chills Eyes: Negative for visual disturbances Respiratory: Negative for shortness of breath, dyspnea Cardiovascular: Negative for chest pain or palpitations  Gastrointestinal: Negative for vomiting, diarrhea and constipation; no abdominal pain Genitourinary: Negative for dysuria and urgency,  vaginal irritation or itching Musculoskeletal: Negative for back pain, joint pain, myalgias  Neurological: Negative for dizziness and headaches    Objective Findings:    Physical Examination: Vitals:   04/24/18 1355  BP: 140/81  Pulse: (!) 106   General appearance - well appearing, and in no distress Mental status - alert, oriented to person, place, and time Chest:  Normal respiratory effort Heart - normal rate and regular rhythm Abdomen:  Soft, nontender.  Incision healing well Pelvic:  deferred Musculoskeletal:  Normal range of motion without pain Extremities:  No edema    No results found for this or any previous visit (from the past 24 hour(s)).    Assessment & Plan:  A:   1 week SP CS, severe preE, on meds P:  Continue meds; stop them 3 days beore PP check up . Plans POPs for [facility] (rx sent in)   Return for As scheduled for postpartum.  Christin Fudge CNM 04/24/2018 2:24 PM

## 2018-05-01 ENCOUNTER — Encounter (HOSPITAL_BASED_OUTPATIENT_CLINIC_OR_DEPARTMENT_OTHER): Payer: Self-pay

## 2018-05-01 DIAGNOSIS — B9620 Unspecified Escherichia coli [E. coli] as the cause of diseases classified elsewhere: Secondary | ICD-10-CM | POA: Diagnosis not present

## 2018-05-01 DIAGNOSIS — N1330 Unspecified hydronephrosis: Secondary | ICD-10-CM | POA: Diagnosis not present

## 2018-05-01 DIAGNOSIS — N390 Urinary tract infection, site not specified: Secondary | ICD-10-CM | POA: Diagnosis not present

## 2018-05-01 DIAGNOSIS — N202 Calculus of kidney with calculus of ureter: Secondary | ICD-10-CM | POA: Diagnosis not present

## 2018-05-02 ENCOUNTER — Encounter (HOSPITAL_BASED_OUTPATIENT_CLINIC_OR_DEPARTMENT_OTHER): Payer: Self-pay | Admitting: *Deleted

## 2018-05-02 ENCOUNTER — Telehealth: Payer: Self-pay | Admitting: Obstetrics & Gynecology

## 2018-05-02 ENCOUNTER — Other Ambulatory Visit: Payer: Self-pay

## 2018-05-02 NOTE — Telephone Encounter (Signed)
Patient states she has been running a fever of 101.  She has taken Tylenol and it is not bringing her fever down.  Her c/s incision is C/D/I with no redness, drainage or irritation noted.  She is breastfeeding but is not having any breast tenderness to indicate mastitis.  She was seen at [facility] yesterday and is scheduled for surgery on Tuesday.  Patient states she called their office but was on hold for 30 minutes and was unable to speak to anyone regarding her fever.   Spoke with [facility] who will try to get in touch with Dr Junious Silk at their office.

## 2018-05-02 NOTE — Progress Notes (Addendum)
Spoke w/ pt via phone for pre-op interview.  Npo after mn.  Arrive at [employer]  Needs istat 8 and ekg.  May take dilaudid, phenergan, tylenol if needed am dos w/ sips of water.  Asked pt to bring rescue inhaler.  Ask MDA about urine preg. Since pt had c/s 04-17-2018.

## 2018-05-02 NOTE — Telephone Encounter (Signed)
Patient called, stated she had a c-section on 04/17/18, took bandages off yesterday and almost passed out.  She started running a fever yesterday and it's currently 101.  She is taking Tylenol and it's not helping.  Concerned she might have an infection.  [employer]  [phone]

## 2018-05-02 NOTE — Telephone Encounter (Signed)
I have spoken with [facility] coverage. Yesterday's u/a suspicious for uti. We have agreed to: 1. Place pt on Bactrim , urologist to E-scribe now. 2 patient may d/c keflex while on treatment. 3 Culture pending, will be back w/i 3 days 4 pt has appt with Dr Junious Silk 11/15 Will have L Cresenzo notify pt of above plan.

## 2018-05-02 NOTE — Telephone Encounter (Signed)
Spoke to patient and informed of plan.   Dr Glo Herring spoke with [facility] coverage. Yesterday's u/a suspicious for uti. We have agreed to: 1. Place pt on Bactrim , urologist to E-scribe now. 2 patient may d/c keflex while on treatment. 3 Culture pending, will be back w/i 3 days   Pt verbalized understanding with no further questions.

## 2018-05-05 MED ORDER — CEFAZOLIN SODIUM-DEXTROSE 2-4 GM/100ML-% IV SOLN
2.0000 g | Freq: Once | INTRAVENOUS | Status: DC
  Filled 2018-05-05: qty 100

## 2018-05-05 NOTE — H&P (Signed)
Office Visit Report     05/01/2018   --------------------------------------------------------------------------------   Melody Stewart  MRN: [id number]  PRIMARY CARE:    DOB: [birth year], 32 year old Female  REFERRING:    SSN:   PROVIDER:  Festus Aloe, M.D.    LOCATION:  [facility] [phone]   --------------------------------------------------------------------------------   CC: I have hydronephrosis.  HPI: Melody Stewart is a 32 year-old female established patient who is here for hydronephrosis.  Her problem was diagnosed 03/15/2018. The problem is on both sides. She had the following x-rays done: Renal Ultrasound.   She is not currently having flank pain, back pain, groin pain, nausea, vomiting, fever or chills.   Recommendation was [redacted] weeks pregnant she had bilateral hydronephrosis and a 32mm hyperechoic shadowing left proximal opacity possible stone. She was monitored and creatinine rose quickly to 2.55 with poor urine output. She was urgently stented March 16, 2018 with ultrasonic guidance and quickly diuresed with creatinine returning to normal. She had HTN and rt flank pain and underwent induction and C-section -- 04/17/2018.   She returns today to planned stent/stone management. She has frequency and mild hematuria. KUB today shows the stents are in good position and they look clean. There is an 11 mm left lower pole stone. She's had no fever or dysuria. She is on cephalexin nightly.     ALLERGIES: Hydrocodone    MEDICATIONS: Keflex  Albuterol Sulfate  Prenatal Vitamins  Triamterene-Hydrochlorothiazid 37.5 mg-25 mg capsule     GU PSH: Cystoscopy Insert Stent, Bilateral - 03/16/2018      PSH Notes: L knee lateral release    NON-GU PSH: None   GU PMH: None   NON-GU PMH: Asthma GERD Hypertension    FAMILY HISTORY: 1 Daughter - No Family History Kidney Stones - Mother   SOCIAL HISTORY: Marital Status: Single Preferred Language: English;  Race: White Current Smoking Status: Patient does not smoke anymore.   Tobacco Use Assessment Completed: Used Tobacco in last 30 days? Does not use smokeless tobacco. Types of alcohol consumed: Beer. Social Drinker.  Drinks 1 caffeinated drink per day. Has not had a blood transfusion.    REVIEW OF SYSTEMS:    GU Review Female:   Patient reports frequent urination, leakage of urine, burning /pain with urination, hard to postpone urination, and get up at night to urinate. Patient denies stream starts and stops, have to strain to urinate, trouble starting your stream, and being pregnant.  Gastrointestinal (Upper):   Patient reports indigestion/ heartburn. Patient denies nausea and vomiting.  Gastrointestinal (Lower):   Patient reports diarrhea. Patient denies constipation.  Constitutional:   Patient denies fever, night sweats, weight loss, and fatigue.  Skin:   Patient denies skin rash/ lesion and itching.  Eyes:   Patient denies blurred vision and double vision.  Ears/ Nose/ Throat:   Patient denies sore throat and sinus problems.  Hematologic/Lymphatic:   Patient denies swollen glands and easy bruising.  Cardiovascular:   Patient denies leg swelling and chest pains.  Respiratory:   Patient denies cough and shortness of breath.  Endocrine:   Patient reports excessive thirst.   Musculoskeletal:   Patient denies back pain and joint pain.  Neurological:   Patient denies headaches and dizziness.  Psychologic:   Patient denies depression and anxiety.   Notes: blood in urine     VITAL SIGNS:      05/01/2018 08:44 AM  Weight 220.4 lb / 99.97 kg  Height 66 in /  167.64 cm  BP 120/78 mmHg  Heart Rate 105 /min  Temperature 97.5 F / 36.3 C  BMI 35.6 kg/m   MULTI-SYSTEM PHYSICAL EXAMINATION:    Constitutional: Well-nourished. No physical deformities. Normally developed. Good grooming.  Neck: Neck symmetrical, not swollen. Normal tracheal position.  Respiratory: No labored breathing, no use  of accessory muscles.   Cardiovascular: Normal temperature, normal extremity pulses, no swelling, no varicosities.  Neurologic / Psychiatric: Oriented to time, oriented to place, oriented to person. No depression, no anxiety, no agitation.  Gastrointestinal: No mass, no tenderness, no rigidity, non obese abdomen.     PAST DATA REVIEWED:  Source Of History:  Patient  X-Ray Review: Renal Ultrasound: Reviewed Films. aug 2019 x 3    PROCEDURES:         KUB - 95621  A single view of the abdomen is obtained.      The bones appeared normal. The bowel gas pattern appeared normal. The soft tissues were unremarkable.         Urinalysis w/Scope Dipstick Dipstick Cont'd Micro  Color: Melody Stewart Bilirubin: Neg mg/dL WBC/hpf: Packed/hpf  Appearance: Cloudy Ketones: Neg mg/dL RBC/hpf: 20 - 40/hpf  Specific Gravity: 1.025 Blood: 3+ ery/uL Bacteria: Many (>50/hpf)  pH: 6.0 Protein: 3+ mg/dL Cystals: NS (Not Seen)  Glucose: Neg mg/dL Urobilinogen: 0.2 mg/dL Casts: NS (Not Seen)    Nitrites: Neg Trichomonas: Not Present    Leukocyte Esterase: 3+ leu/uL Mucous: Not Present      Epithelial Cells: 0 - 5/hpf      Yeast: NS (Not Seen)      Sperm: Not Present    ASSESSMENT:      ICD-10 Details  1 GU:   Hydronephrosis Unspec - N13.30   2   Ureteral calculus - N20.1   3   Renal calculus - N20.0    PLAN:           Orders Labs Urine Culture  X-Rays: KUB          Schedule Return Visit/Planned Activity: Keep Scheduled Appointment - Schedule Surgery          Document Letter(s):  Created for Patient: Clinical Summary         Notes:   Left renal stone-I discussed the findings with the patient in the major risk and benefits of shockwave lithotripsy or ureteroscopy versus simply removing the stent and continuing surveillance. All questions answered and she elected to proceed with ureteroscopy. I went ahead and got that set up for next week so that we would not leave the stent in longer than needed. I  drew her a a picture of the anatomy. discussed slight possibility of stage procedure if the left lower pole stones are accessible.   Hydronephrosis-she is status post bilateral stents-we will plan to remove these at ureteroscopy.        Next Appointment:      Next Appointment: 05/06/2018 09:15 AM    Appointment Type: Surgery     Location: [facility] [phone]    Provider: Festus Aloe, M.D.    Reason for Visit: NE/OP CYSTO, BL URS STENT X, LL      * Signed by Festus Aloe, M.D. on 05/01/18 at 9:04 PM (EDT)*     The information contained in this medical record document is considered private and confidential patient information. This information can only be used for the medical diagnosis and/or medical services that are being provided by the patient's selected caregivers. This information can  only be distributed outside of the patient's care if the patient agrees and signs waivers of authorization for this information to be sent to an outside source or route.   Addendum: Preoperative culture -  >100,000 C/ML GRAM NEGATIVE RODS; ID AND SENSITIVITY TO FOLLOW Performed By: [facility], [address] [place], [place], [place], [address]; Lab Dir: Vedia Pereyra, [phone] . Antimicrobial Susceptibility and Organism Identification Report  FINAL Source     : URINE CULTURE Iso/Result : 01    >100,000 C/ML     Escherichia coli Antimicrobic/Dose                        MIC   SYSTEMIC   URINE --------------------------------------------------------------- Amp/Sulbactam                           16/8       I        I Amikacin                                 <16       S        S Ampicillin                               >16       R        R Amox/K Clav                            >16/8       R        R Aztreonam                                 <4       S        S Ceftriaxone                               <1       S        S Ceftazidime                                <1       S        S Cefotaxime                                <2       S        S Cefazolin                                 16       I        I Ciprofloxacin                             <1       S        S Cefepime                                  <  2       S        S Cefotetan                                <16       S        S Ertapenem                               <0.5       S        S Nitrofurantoin                           <32                S Gentamicin                                <4       S        S Levofloxacin                              <2       S        S Meropenem                                 <1       S        S Pip/Tazo                                 <16       S        S Trimeth/Sulfa                          <2/38       S        S Tetracycline                              <4       S        S Tobramycin                                <4       S        S  Patient with fever of 101.  She was started on Bactrim.

## 2018-05-06 ENCOUNTER — Other Ambulatory Visit: Payer: Self-pay

## 2018-05-06 ENCOUNTER — Ambulatory Visit (HOSPITAL_BASED_OUTPATIENT_CLINIC_OR_DEPARTMENT_OTHER): Payer: Federal, State, Local not specified - PPO | Admitting: Anesthesiology

## 2018-05-06 ENCOUNTER — Ambulatory Visit (HOSPITAL_BASED_OUTPATIENT_CLINIC_OR_DEPARTMENT_OTHER)
Admission: RE | Admit: 2018-05-06 | Discharge: 2018-05-06 | Disposition: A | Discharge status: Home / Self Care | Payer: Federal, State, Local not specified - PPO | Source: Ambulatory Visit | Attending: Urology | Admitting: Urology

## 2018-05-06 ENCOUNTER — Encounter (HOSPITAL_BASED_OUTPATIENT_CLINIC_OR_DEPARTMENT_OTHER): Payer: Self-pay | Admitting: *Deleted

## 2018-05-06 ENCOUNTER — Encounter (HOSPITAL_BASED_OUTPATIENT_CLINIC_OR_DEPARTMENT_OTHER)
Admission: RE | Disposition: A | Discharge status: Home / Self Care | Payer: Self-pay | Source: Ambulatory Visit | Attending: Urology

## 2018-05-06 DIAGNOSIS — Z79899 Other long term (current) drug therapy: Secondary | ICD-10-CM | POA: Diagnosis not present

## 2018-05-06 DIAGNOSIS — O9989 Other specified diseases and conditions complicating pregnancy, childbirth and the puerperium: Secondary | ICD-10-CM | POA: Diagnosis not present

## 2018-05-06 DIAGNOSIS — Z885 Allergy status to narcotic agent status: Secondary | ICD-10-CM | POA: Diagnosis not present

## 2018-05-06 DIAGNOSIS — K219 Gastro-esophageal reflux disease without esophagitis: Secondary | ICD-10-CM | POA: Diagnosis not present

## 2018-05-06 DIAGNOSIS — N132 Hydronephrosis with renal and ureteral calculous obstruction: Secondary | ICD-10-CM | POA: Diagnosis not present

## 2018-05-06 DIAGNOSIS — N200 Calculus of kidney: Secondary | ICD-10-CM

## 2018-05-06 DIAGNOSIS — J45909 Unspecified asthma, uncomplicated: Secondary | ICD-10-CM | POA: Diagnosis not present

## 2018-05-06 DIAGNOSIS — I10 Essential (primary) hypertension: Secondary | ICD-10-CM | POA: Diagnosis not present

## 2018-05-06 DIAGNOSIS — Z87891 Personal history of nicotine dependence: Secondary | ICD-10-CM | POA: Diagnosis not present

## 2018-05-06 HISTORY — DX: Dysuria: R30.0

## 2018-05-06 HISTORY — DX: Unspecified hydronephrosis: N13.30

## 2018-05-06 HISTORY — DX: Pain, unspecified: R52

## 2018-05-06 HISTORY — DX: Frequency of micturition: R35.0

## 2018-05-06 HISTORY — DX: Anemia, unspecified: D64.9

## 2018-05-06 HISTORY — DX: Unspecified pre-eclampsia, unspecified trimester: O14.90

## 2018-05-06 HISTORY — DX: Unspecified asthma, uncomplicated: J45.909

## 2018-05-06 HISTORY — DX: Urgency of urination: R39.15

## 2018-05-06 HISTORY — DX: Family history of other specified conditions: Z84.89

## 2018-05-06 HISTORY — PX: CYSTOSCOPY/URETEROSCOPY/HOLMIUM LASER/STENT PLACEMENT: SHX6546

## 2018-05-06 SURGERY — CYSTOSCOPY/URETEROSCOPY/HOLMIUM LASER/STENT PLACEMENT
Anesthesia: General | Laterality: Bilateral

## 2018-05-06 MED ORDER — LIDOCAINE 2% (20 MG/ML) 5 ML SYRINGE
INTRAMUSCULAR | Status: DC | PRN
  Administered 2018-05-06: 60 mg via INTRAVENOUS

## 2018-05-06 MED ORDER — FENTANYL CITRATE (PF) 100 MCG/2ML IJ SOLN
INTRAMUSCULAR | Status: AC
  Filled 2018-05-06: qty 2

## 2018-05-06 MED ORDER — SCOPOLAMINE 1 MG/3DAYS TD PT72
MEDICATED_PATCH | TRANSDERMAL | Status: AC
  Filled 2018-05-06: qty 1

## 2018-05-06 MED ORDER — SODIUM CHLORIDE 0.9 % IV SOLN
INTRAVENOUS | Status: DC
  Administered 2018-05-06: 08:00:00 via INTRAVENOUS
  Filled 2018-05-06: qty 1000

## 2018-05-06 MED ORDER — SODIUM CHLORIDE 0.9 % IR SOLN
Status: DC | PRN
  Administered 2018-05-06 (×2): 3000 mL via INTRAVESICAL

## 2018-05-06 MED ORDER — FENTANYL CITRATE (PF) 100 MCG/2ML IJ SOLN
INTRAMUSCULAR | Status: DC | PRN
  Administered 2018-05-06 (×2): 50 ug via INTRAVENOUS

## 2018-05-06 MED ORDER — SODIUM CHLORIDE 0.9 % IV SOLN
INTRAVENOUS | Status: AC
  Filled 2018-05-06: qty 100

## 2018-05-06 MED ORDER — SCOPOLAMINE 1 MG/3DAYS TD PT72
1.0000 | MEDICATED_PATCH | TRANSDERMAL | Status: DC
  Administered 2018-05-06: 1.5 mg via TRANSDERMAL
  Filled 2018-05-06: qty 1

## 2018-05-06 MED ORDER — MIDAZOLAM HCL 5 MG/5ML IJ SOLN
INTRAMUSCULAR | Status: DC | PRN
  Administered 2018-05-06: 2 mg via INTRAVENOUS

## 2018-05-06 MED ORDER — OXYCODONE HCL 5 MG PO TABS
5.0000 mg | ORAL_TABLET | Freq: Once | ORAL | Status: AC | PRN
  Administered 2018-05-06: 5 mg via ORAL
  Filled 2018-05-06: qty 1

## 2018-05-06 MED ORDER — OXYCODONE HCL 5 MG PO TABS
ORAL_TABLET | ORAL | Status: AC
  Filled 2018-05-06: qty 1

## 2018-05-06 MED ORDER — ONDANSETRON HCL 4 MG/2ML IJ SOLN
INTRAMUSCULAR | Status: DC | PRN
  Administered 2018-05-06: 4 mg via INTRAVENOUS

## 2018-05-06 MED ORDER — DEXAMETHASONE SODIUM PHOSPHATE 10 MG/ML IJ SOLN
INTRAMUSCULAR | Status: AC
  Filled 2018-05-06: qty 1

## 2018-05-06 MED ORDER — SODIUM CHLORIDE 0.9 % IV SOLN
2.0000 g | Freq: Once | INTRAVENOUS | Status: AC
  Administered 2018-05-06: 2 g via INTRAVENOUS
  Filled 2018-05-06: qty 20

## 2018-05-06 MED ORDER — KETOROLAC TROMETHAMINE 30 MG/ML IJ SOLN
INTRAMUSCULAR | Status: AC
  Filled 2018-05-06: qty 1

## 2018-05-06 MED ORDER — IOHEXOL 300 MG/ML  SOLN
INTRAMUSCULAR | Status: DC | PRN
  Administered 2018-05-06: 10 mL via URETHRAL

## 2018-05-06 MED ORDER — LACTATED RINGERS IV SOLN
INTRAVENOUS | Status: DC
  Administered 2018-05-06: 12:00:00 via INTRAVENOUS
  Filled 2018-05-06: qty 1000

## 2018-05-06 MED ORDER — PROPOFOL 10 MG/ML IV BOLUS
INTRAVENOUS | Status: AC
  Filled 2018-05-06: qty 20

## 2018-05-06 MED ORDER — MIDAZOLAM HCL 2 MG/2ML IJ SOLN
INTRAMUSCULAR | Status: AC
  Filled 2018-05-06: qty 2

## 2018-05-06 MED ORDER — KETOROLAC TROMETHAMINE 30 MG/ML IJ SOLN
INTRAMUSCULAR | Status: DC | PRN
  Administered 2018-05-06: 30 mg via INTRAVENOUS

## 2018-05-06 MED ORDER — OXYCODONE HCL 5 MG/5ML PO SOLN
5.0000 mg | Freq: Once | ORAL | Status: AC | PRN
  Filled 2018-05-06: qty 5

## 2018-05-06 MED ORDER — FENTANYL CITRATE (PF) 100 MCG/2ML IJ SOLN
25.0000 ug | INTRAMUSCULAR | Status: DC | PRN
  Administered 2018-05-06: 25 ug via INTRAVENOUS
  Filled 2018-05-06: qty 1

## 2018-05-06 MED ORDER — CEFAZOLIN SODIUM-DEXTROSE 2-4 GM/100ML-% IV SOLN
INTRAVENOUS | Status: AC
  Filled 2018-05-06: qty 100

## 2018-05-06 MED ORDER — SULFAMETHOXAZOLE-TRIMETHOPRIM 800-160 MG PO TABS: 1.0000 | ORAL_TABLET | Freq: Two times a day (BID) | ORAL | Status: DC

## 2018-05-06 MED ORDER — PROPOFOL 10 MG/ML IV BOLUS
INTRAVENOUS | Status: DC | PRN
  Administered 2018-05-06: 200 mg via INTRAVENOUS

## 2018-05-06 MED ORDER — DEXAMETHASONE SODIUM PHOSPHATE 10 MG/ML IJ SOLN
INTRAMUSCULAR | Status: DC | PRN
  Administered 2018-05-06: 10 mg via INTRAVENOUS

## 2018-05-06 MED ORDER — ONDANSETRON HCL 4 MG/2ML IJ SOLN
4.0000 mg | Freq: Four times a day (QID) | INTRAMUSCULAR | Status: DC | PRN
  Filled 2018-05-06: qty 2

## 2018-05-06 MED ORDER — CEFTRIAXONE SODIUM 2 G IJ SOLR
INTRAMUSCULAR | Status: AC
  Filled 2018-05-06: qty 20

## 2018-05-06 MED ORDER — LIDOCAINE 2% (20 MG/ML) 5 ML SYRINGE
INTRAMUSCULAR | Status: AC
  Filled 2018-05-06: qty 5

## 2018-05-06 SURGICAL SUPPLY — 28 items
BAG DRAIN URO-CYSTO SKYTR STRL (DRAIN) ×2 IMPLANT
BAG DRN UROCATH (DRAIN) ×1
BASKET LASER NITINOL 1.9FR (BASKET) IMPLANT
BASKET ZERO TIP NITINOL 2.4FR (BASKET) ×1 IMPLANT
BSKT STON RTRVL 120 1.9FR (BASKET)
BSKT STON RTRVL ZERO TP 2.4FR (BASKET) ×1
CATH URET 5FR 28IN CONE TIP (BALLOONS)
CATH URET 5FR 28IN OPEN ENDED (CATHETERS) ×1 IMPLANT
CATH URET 5FR 70CM CONE TIP (BALLOONS) IMPLANT
CATH URET DUAL LUMEN 6-10FR 50 (CATHETERS) IMPLANT
CLOTH BEACON ORANGE TIMEOUT ST (SAFETY) ×2 IMPLANT
FIBER LASER FLEXIVA 365 (UROLOGICAL SUPPLIES) IMPLANT
FIBER LASER TRAC TIP (UROLOGICAL SUPPLIES) ×1 IMPLANT
GLOVE BIO SURGEON STRL SZ7.5 (GLOVE) ×2 IMPLANT
GOWN STRL REUS W/TWL LRG LVL3 (GOWN DISPOSABLE) ×2 IMPLANT
GOWN STRL REUS W/TWL XL LVL3 (GOWN DISPOSABLE) IMPLANT
GUIDEWIRE 0.038 PTFE COATED (WIRE) IMPLANT
GUIDEWIRE ANG ZIPWIRE 038X150 (WIRE) ×1 IMPLANT
GUIDEWIRE STR DUAL SENSOR (WIRE) ×2 IMPLANT
INFUSOR MANOMETER BAG 3000ML (MISCELLANEOUS) ×2 IMPLANT
IV NS IRRIG 3000ML ARTHROMATIC (IV SOLUTION) ×4 IMPLANT
KIT TURNOVER CYSTO (KITS) ×2 IMPLANT
MANIFOLD NEPTUNE II (INSTRUMENTS) IMPLANT
NS IRRIG 500ML POUR BTL (IV SOLUTION) ×2 IMPLANT
PACK CYSTO (CUSTOM PROCEDURE TRAY) ×2 IMPLANT
STENT URET 6FRX26 CONTOUR (STENTS) ×1 IMPLANT
TUBE CONNECTING 12X1/4 (SUCTIONS) IMPLANT
TUBING UROLOGY SET (TUBING) ×1 IMPLANT

## 2018-05-06 NOTE — Discharge Instructions (Signed)
Ureteral Stent Implantation, Care After Refer to this sheet in the next few weeks. These instructions provide you with information about caring for yourself after your procedure. Your health care provider may also give you more specific instructions. Your treatment has been planned according to current medical practices, but problems sometimes occur. Call your health care provider if you have any problems or questions after your procedure.  Removal of the stent: Remove the stent by pulling the string with slow steady pressure on Friday morning, May 09, 2018  What can I expect after the procedure? After the procedure, it is common to have:  Nausea.  Mild pain when you urinate. You may feel this pain in your lower back or lower abdomen. Pain should stop within a few minutes after you urinate. This may last for up to 1 week.  A small amount of blood in your urine for several days.  Follow these instructions at home:  Medicines  Take over-the-counter and prescription medicines only as told by your health care provider.  If you were prescribed an antibiotic medicine, take it as told by your health care provider. Do not stop taking the antibiotic even if you start to feel better.  Do not drive for 24 hours if you received a sedative.  Do not drive or operate heavy machinery while taking prescription pain medicines. Activity  Return to your normal activities as told by your health care provider. Ask your health care provider what activities are safe for you.  Do not lift anything that is heavier than 10 lb (4.5 kg). Follow this limit for 1 week after your procedure, or for as long as told by your health care provider. General instructions  Watch for any blood in your urine. Call your health care provider if the amount of blood in your urine increases.  If you have a catheter: ? Follow instructions from your health care provider about taking care of your catheter and collection  bag. ? Do not take baths, swim, or use a hot tub until your health care provider approves.  Drink enough fluid to keep your urine clear or pale yellow.  Keep all follow-up visits as told by your health care provider. This is important. Contact a health care provider if:  You have pain that gets worse or does not get better with medicine, especially pain when you urinate.  You have difficulty urinating.  You feel nauseous or you vomit repeatedly during a period of more than 2 days after the procedure. Get help right away if:  Your urine is dark red or has blood clots in it.  You are leaking urine (have incontinence).  The end of the stent comes out of your urethra.  You cannot urinate.  You have sudden, sharp, or severe pain in your abdomen or lower back.  You have a fever. This information is not intended to replace advice given to you by your health care provider. Make sure you discuss any questions you have with your health care provider. Document Released: 03/11/2013 Document Revised: 12/15/2015 Document Reviewed: 01/21/2015 Elsevier Interactive Patient Education  2018 [employer].    Next ibuprofen may be taken after 3:45 if needed    Post Anesthesia Home Care Instructions  Activity: Get plenty of rest for the remainder of the day. A responsible individual must stay with you for 24 hours following the procedure.  For the next 24 hours, DO NOT: -Drive a car -Paediatric nurse -Drink alcoholic beverages -Take any medication unless  instructed by your physician -Make any legal decisions or sign important papers.  Meals: Start with liquid foods such as gelatin or soup. Progress to regular foods as tolerated. Avoid greasy, spicy, heavy foods. If nausea and/or vomiting occur, drink only clear liquids until the nausea and/or vomiting subsides. Call your physician if vomiting continues.  Special Instructions/Symptoms: Your throat may feel dry or sore from the anesthesia  or the breathing tube placed in your throat during surgery. If this causes discomfort, gargle with warm salt water. The discomfort should disappear within 24 hours.  If you had a scopolamine patch placed behind your ear for the management of post- operative nausea and/or vomiting:  1. The medication in the patch is effective for 72 hours, after which it should be removed.  Wrap patch in a tissue and discard in the trash. Wash hands thoroughly with soap and water. 2. You may remove the patch earlier than 72 hours if you experience unpleasant side effects which may include dry mouth, dizziness or visual disturbances. 3. Avoid touching the patch. Wash your hands with soap and water after contact with the patch.

## 2018-05-06 NOTE — Op Note (Signed)
Preoperative diagnosis: Right hydronephrosis, left hydronephrosis, left renal stone Postoperative diagnosis: Left renal stone  Procedure: Cystoscopy with right stent removal, right ureteroscopy, right retrograde pyelogram, left stent exchange, left ureteroscopy with holmium laser lithotripsy and stone basket extraction  Surgeon: Junious Silk  Anesthesia: General  Indication for procedure: 32 year old white female who is status post C-section but while she was [redacted] weeks pregnant developed bilateral hydronephrosis and a UPJ stone on the left on ultrasound.  She underwent urgent stents due to acute kidney injury and decreased urine output.  She presents today for definitive stent/stone management.  She developed a fever 5 days ago and urine culture grew E. coli with intermediate sensitivity to cefazolin (she was taking nightly cephalexin).  She was switched to Bactrim and her fever resolved.  Findings: On cystoscopy the stents were encrusted right greater than left.  Right ureteroscopy-the ureter appeared normal with just a mild amount of stone debris from the stent.  Right retrograde pyelogram-this outlined a single ureter single collecting system unit without filling defect, stricture or dilation.  There was brisk drainage of the pelvis and ureter.  Some of the contrast stayed in the infundibula and calyces but there was no dilation.  Left ureteroscopy revealed a normal ureter and the stone was in the left lower pole.  Description of procedure: After consent was obtained patient brought to the operating room.  After adequate anesthesia was placed in lithotomy position and prepped and draped in the usual sterile fashion.  A timeout was performed to confirm the patient and procedure.  The cystoscope was passed per urethra and the bladder irrigated several times to flush out any bacteria.  The right stent was more encrusted than the left and there appeared to be a mild amount of encrustation on the  proximal coil but no large stone.  Therefore I passed a sensor wire adjacent to the right stent.  The right stent was then removed with some resistance but not significant.  The proximal coil did have some encrustation but just a couple of millimeters thick.  To clear the right ureter the semirigid ureteroscope was passed adjacent to the sensor wire and the ureter inspected and noted to be normal without significant stone fragment.  There was no stone on the prior ultrasound and no stone evident on fluoroscopy.  I was able to advance the semirigid scope up to the right ureteropelvic junction and then backed down.  To be certain I passed a 5 [place] open-ended catheter over the wire and remove the wire.  Urine from the right pelvis was aspirated noted to be clear but orange from Azo.  I then injected retrograde contrast to fill the calyces and pelvis and backed the 5 [place] open-ended catheter out injecting contrast outlined the ureter.  There was no dilation and no extravasation.  No filling defects.  The renal pelvis and the ureter washed out quickly.  I then turned my attention to the left side and passed a Glidewire adjacent to the stent on the left and then remove the stent.  The bladder coil had a millimeter of encrustation but the stent and proximal coil were clear.  I passed a semirigid scope adjacent to the Glidewire up to the left renal pelvis and the left ureter was noted to be normal.  I then passed a sensor wire and backed out the semirigid scope.  I then passed an access sheath up to the left renal pelvis to max during the system and passed the dual channel digital  ureteroscope.  The stone was located in the left lower pole and it was relocated to the upper pole with a 0 tip basket.  The stone was then fragmented at 0.4 and 50 and 0.8 and 8.  The 3 largest fragments were removed and there were no other significant fragments remaining.  The calyces were again inspected and noted to be clear as well as  the renal pelvis and proximal ureter.  The ureteroscope was backed out with the access sheath and the left ureter was noted to be normal without injury or stone fragment.  I then backloaded the Glidewire on the cystoscope and a 6 x 26 cm stent was advanced.  The wire was removed with a good coil seen in the left kidney and a good coil in the bladder.  The bladder was drained and the scope removed.  She was awakened and taken to the recovery room in stable condition.  Complications: None  Specimens: Stone fragments to patient  Drains: 6 x 26 cm left ureteral stent  Blood loss: Minimal  Disposition: Patient stable to PACU

## 2018-05-06 NOTE — Transfer of Care (Signed)
Immediate Anesthesia Transfer of Care Note  Patient: Melody Stewart  Procedure(s) Performed: CYSTOSCOPY/URETEROSCOPY/HOLMIUM LASER/STENT EXCHANGE STONE BASKET RETRIVAL (Bilateral )  Patient Location: PACU  Anesthesia Type:General  Level of Consciousness: awake, alert  and oriented  Airway & Oxygen Therapy: Patient Spontanous Breathing and Patient connected to nasal cannula oxygen  Post-op Assessment: Report given to RN  Post vital signs: Reviewed and stable  Last Vitals:  Vitals Value Taken Time  BP 114/73 05/06/2018 10:50 AM  Temp    Pulse 78 05/06/2018 10:51 AM  Resp 17 05/06/2018 10:51 AM  SpO2 100 % 05/06/2018 10:51 AM  Vitals shown include unvalidated device data.  Last Pain:  Vitals:   05/06/18 0757  TempSrc:   PainSc: 0-No pain      Patients Stated Pain Goal: 5 (07/61/51 8343)  Complications: No apparent anesthesia complications

## 2018-05-06 NOTE — Anesthesia Procedure Notes (Signed)
Procedure Name: LMA Insertion Date/Time: 05/06/2018 9:25 AM Performed by: Victoriano Lain, CRNA Pre-anesthesia Checklist: Patient identified, Emergency Drugs available, Suction available, Patient being monitored and Timeout performed Patient Re-evaluated:Patient Re-evaluated prior to induction Oxygen Delivery Method: Circle system utilized Preoxygenation: Pre-oxygenation with 100% oxygen Induction Type: IV induction Ventilation: Mask ventilation without difficulty LMA: LMA inserted LMA Size: 4.0 Number of attempts: 1 Placement Confirmation: positive ETCO2 and breath sounds checked- equal and bilateral Tube secured with: Tape Dental Injury: Teeth and Oropharynx as per pre-operative assessment

## 2018-05-06 NOTE — Interval H&P Note (Signed)
History and Physical Interval Note:  05/06/2018 9:13 AM  [name]  has presented today for surgery, with the diagnosis of BILATERAL HYDRONEPHROSIS, LEFT URETERAL STONE  The various methods of treatment have been discussed with the patient and family. After consideration of risks, benefits and other options for treatment, the patient has consented to  Procedure(s): CYSTOSCOPY/URETEROSCOPY/HOLMIUM LASER/STENT EXCHANGE (Bilateral) as a surgical intervention .  The patient's history has been reviewed, patient examined, no change in status, stable for surgery. She had fever and e coli UTI 10/10 -- she was started on Bactrim and continued. No fever in three days. Urine cloudy and dysuria which has been present since stents were placed per pt. She looks well.   I have reviewed the patient's chart and labs.  Questions were answered to the patient's satisfaction.     Melody Stewart

## 2018-05-06 NOTE — Anesthesia Preprocedure Evaluation (Signed)
Anesthesia Evaluation  Patient identified by MRN, date of birth, ID band Patient awake    Reviewed: Allergy & Precautions, H&P , NPO status , Patient's Chart, lab work & pertinent test results  Airway Mallampati: II   Neck ROM: full    Dental   Pulmonary asthma , former smoker,    breath sounds clear to auscultation       Cardiovascular hypertension,  Rhythm:regular Rate:Normal     Neuro/Psych    GI/Hepatic GERD  ,  Endo/Other    Renal/GU stones     Musculoskeletal   Abdominal   Peds  Hematology   Anesthesia Other Findings   Reproductive/Obstetrics                             Anesthesia Physical Anesthesia Plan  ASA: II  Anesthesia Plan: General   Post-op Pain Management:    Induction: Intravenous  PONV Risk Score and Plan: 3 and Ondansetron, Dexamethasone, Midazolam and Treatment may vary due to age or medical condition  Airway Management Planned: LMA  Additional Equipment:   Intra-op Plan:   Post-operative Plan:   Informed Consent: I have reviewed the patients History and Physical, chart, labs and discussed the procedure including the risks, benefits and alternatives for the proposed anesthesia with the patient or authorized representative who has indicated his/her understanding and acceptance.     Plan Discussed with: CRNA, Anesthesiologist and Surgeon  Anesthesia Plan Comments:         Anesthesia Quick Evaluation

## 2018-05-07 ENCOUNTER — Encounter (HOSPITAL_BASED_OUTPATIENT_CLINIC_OR_DEPARTMENT_OTHER): Payer: Self-pay | Admitting: Urology

## 2018-05-07 NOTE — Anesthesia Postprocedure Evaluation (Signed)
Anesthesia Post Note  Patient: Museum/gallery conservator D Rodeheaver  Procedure(s) Performed: CYSTOSCOPY/URETEROSCOPY/HOLMIUM LASER/STENT EXCHANGE STONE BASKET RETRIVAL (Bilateral )     Patient location during evaluation: PACU Anesthesia Type: General Level of consciousness: awake and alert Pain management: pain level controlled Vital Signs Assessment: post-procedure vital signs reviewed and stable Respiratory status: spontaneous breathing, nonlabored ventilation, respiratory function stable and patient connected to nasal cannula oxygen Cardiovascular status: blood pressure returned to baseline and stable Postop Assessment: no apparent nausea or vomiting Anesthetic complications: no    Last Vitals:  Vitals:   05/06/18 1130 05/06/18 1245  BP: 113/67 110/68  Pulse: 67 72  Resp: 12 14  Temp:  36.7 C  SpO2: 97% 100%    Last Pain:  Vitals:   05/06/18 1230  TempSrc:   PainSc: [place]

## 2018-05-14 ENCOUNTER — Encounter: Payer: Self-pay | Admitting: *Deleted

## 2018-05-14 ENCOUNTER — Telehealth: Payer: Self-pay | Admitting: Obstetrics and Gynecology

## 2018-05-14 DIAGNOSIS — J309 Allergic rhinitis, unspecified: Secondary | ICD-10-CM | POA: Diagnosis not present

## 2018-05-14 DIAGNOSIS — K219 Gastro-esophageal reflux disease without esophagitis: Secondary | ICD-10-CM | POA: Diagnosis not present

## 2018-05-14 DIAGNOSIS — K589 Irritable bowel syndrome without diarrhea: Secondary | ICD-10-CM | POA: Diagnosis not present

## 2018-05-14 DIAGNOSIS — J4520 Mild intermittent asthma, uncomplicated: Secondary | ICD-10-CM | POA: Diagnosis not present

## 2018-05-14 NOTE — Telephone Encounter (Signed)
Patient called stating that she would like for Dr. Glo Herring to write another letter for her job, Letting then know that she had a C-section and is still under our care. Pt has a postpartum appointment on 05/23/2018. Please contact pt when letter is ready for pick up.

## 2018-05-23 ENCOUNTER — Ambulatory Visit (INDEPENDENT_AMBULATORY_CARE_PROVIDER_SITE_OTHER): Payer: Federal, State, Local not specified - PPO | Admitting: Adult Health

## 2018-05-23 ENCOUNTER — Encounter: Payer: Self-pay | Admitting: Adult Health

## 2018-05-23 DIAGNOSIS — F419 Anxiety disorder, unspecified: Secondary | ICD-10-CM

## 2018-05-23 DIAGNOSIS — R350 Frequency of micturition: Secondary | ICD-10-CM | POA: Diagnosis not present

## 2018-05-23 DIAGNOSIS — Z30015 Encounter for initial prescription of vaginal ring hormonal contraceptive: Secondary | ICD-10-CM | POA: Diagnosis not present

## 2018-05-23 LAB — POCT URINALYSIS DIPSTICK OB
Blood, UA: NEGATIVE
GLUCOSE, UA: NEGATIVE
KETONES UA: NEGATIVE
Nitrite, UA: NEGATIVE

## 2018-05-23 MED ORDER — ETONOGESTREL-ETHINYL ESTRADIOL 0.12-0.015 MG/24HR VA RING: VAGINAL_RING | VAGINAL | 12 refills | Status: DC

## 2018-05-23 MED ORDER — BUSPIRONE HCL 5 MG PO TABS: 5.0000 mg | ORAL_TABLET | Freq: Three times a day (TID) | ORAL | 3 refills | Status: DC

## 2018-05-23 NOTE — Addendum Note (Signed)
Addended by: Diona Fanti A on: 05/23/2018 12:36 PM   Modules accepted: Orders

## 2018-05-23 NOTE — Progress Notes (Signed)
Patient ID: Melody Stewart, female   DOB: [birth year], 32 y.o.   MRN: [id number] Melody Stewart is a 32 year old white female in for a postpartum visit. She has a C section 33 weeks ago at 37+6 weeks for severe pre eclampsia, by Dr Ihor Dow and had kidney stones. And had stents removed and Laser on kidney stones 05/06/18 by Dr Junious Silk.  Delivery Date:04/17/18  Method of Delivery: C-section  Sexual Activity since delivery: No  Method of Feeding: Bottle  Number of weeks bleeding post delivery: 4 weeks   Review of Systems: Patient denies any headaches, hearing loss, fatigue, blurred vision, shortness of breath, chest pain, abdominal pain, problems with bowel movements,  or intercourse. No joint pain or mood swings.+urinary frequency and can't hold urine C section scar sore and tender to touch in that area  Reviewed past medical,surgical, social and family history. Reviewed medications and allergies.  Depression Score: 4 BP 129/74 (BP Location: Left Arm, Patient Position: Sitting, Cuff Size: Normal)   Pulse 87   Ht 5\' 6"  (1.676 m)   Wt 32 lb (14.5 kg)   Breastfeeding? No   BMI 5.16 kg/m urine trace protein and 2+WBC  Skin warm and dry. Neck: mid line trachea, normal thyroid, good ROM, no lymphadenopathy noted. Lungs: clear to ausculation bilaterally. Cardiovascular: regular rate and rhythm. Abdomen is soft and slightly tender above scar, but  C-section incision is together, no redness or swelling Pelvic Exam:   External genitalia is normal in appearance, no lesions.  The vagina has good color, moisture and rugae, no lesions.Urethra has no masses or tenderness noted. The cervix is bulbous.  Uterus is felt to be normal size, shape, and contour, well involuted.  No adnexal masses or tenderness noted.Bladder is non tender, no masses felt. Will try Buspar for anxiety and take time for self Examination chaperoned by Diona Fanti CMA.    Impression:   1. Postpartum care and examination   2.  Encounter for initial prescription of vaginal ring hormonal contraceptive   3. Anxiety   4. Urinary frequency      Plan:   Finish current pack of OCs then start nuva ring, use condoms  Meds ordered this encounter  Medications  . etonogestrel-ethinyl estradiol (NUVARING) 0.12-0.015 MG/24HR vaginal ring    Sig: Insert vaginally and leave in place for 3 consecutive weeks, then remove for 1 week.    Dispense:  1 each    Refill:  12    Order Specific Question:   Supervising Provider    Answer:   Elonda Husky, LUTHER H [[id number]]  . busPIRone (BUSPAR) 5 MG tablet    Sig: Take 1 tablet (5 mg total) by mouth 3 (three) times daily.    Dispense:  60 tablet    Refill:  3    Order Specific Question:   Supervising Provider    Answer:   Florian Buff [[id number]]  F/U  11/18 Note given for work, RTW 11/25, no lifting over 25 lbs for 4 weeks  Try pantie girdle  UA C&S sent

## 2018-05-24 LAB — MICROSCOPIC EXAMINATION
CASTS: NONE SEEN /LPF
WBC, UA: >30 /hpf — AB (ref 0–5)

## 2018-05-24 LAB — URINALYSIS, ROUTINE W REFLEX MICROSCOPIC
Bilirubin, UA: NEGATIVE
GLUCOSE, UA: NEGATIVE
Ketones, UA: NEGATIVE
Nitrite, UA: POSITIVE — AB
SPEC GRAV UA: 1.021 (ref 1.005–1.030)
Urobilinogen, Ur: 0.2 mg/dL (ref 0.2–1.0)
pH, UA: 5.5 (ref 5.0–7.5)

## 2018-05-25 LAB — URINE CULTURE

## 2018-05-26 ENCOUNTER — Telehealth: Payer: Self-pay | Admitting: Adult Health

## 2018-05-26 MED ORDER — SULFAMETHOXAZOLE-TRIMETHOPRIM 800-160 MG PO TABS: 1.0000 | ORAL_TABLET | Freq: Two times a day (BID) | ORAL | 0 refills | Status: DC

## 2018-05-26 NOTE — Telephone Encounter (Signed)
Pt aware urine culture + Ecoli, will rx septra ds 1 bid x 7 days

## 2018-06-10 ENCOUNTER — Encounter: Payer: Self-pay | Admitting: Adult Health

## 2018-06-10 ENCOUNTER — Ambulatory Visit (INDEPENDENT_AMBULATORY_CARE_PROVIDER_SITE_OTHER): Payer: Federal, State, Local not specified - PPO | Admitting: Adult Health

## 2018-06-10 VITALS — BP 102/59 | HR 75 | Ht 66.0 in | Wt 236.6 lb

## 2018-06-10 DIAGNOSIS — Z87440 Personal history of urinary (tract) infections: Secondary | ICD-10-CM | POA: Diagnosis not present

## 2018-06-10 DIAGNOSIS — F419 Anxiety disorder, unspecified: Secondary | ICD-10-CM | POA: Diagnosis not present

## 2018-06-10 LAB — POCT URINALYSIS DIPSTICK
Glucose, UA: NEGATIVE
LEUKOCYTES UA: NEGATIVE
Nitrite, UA: NEGATIVE
Protein, UA: NEGATIVE
RBC UA: NEGATIVE

## 2018-06-10 MED ORDER — ESCITALOPRAM OXALATE 10 MG PO TABS: 10.0000 mg | ORAL_TABLET | Freq: Every day | ORAL | 6 refills | Status: DC

## 2018-06-10 NOTE — Progress Notes (Signed)
  Subjective:     Patient ID: [name], female   DOB: [birth year], 32 y.o.   MRN: [id number]  HPI Imogean is a 32 year old white female back in F/U on starting buspar. Still has anxiety, and feels nervous at times.   Review of Systems +anxiety, and nervous at times  Still some discomfort above  C section scar, but is better  She has some urinary frequency still,she has finished meds, where urine was + E coli, and she sees urologist tomorrow  Reviewed past medical,surgical, social and family history. Reviewed medications and allergies.     Objective:   Physical Exam BP (!) 102/59 (BP Location: Right Arm, Patient Position: Sitting, Cuff Size: Large)   Pulse 75   Ht 5\' 6"  (1.676 m)   Wt 236 lb 9.6 oz (107.3 kg)   LMP 05/24/2018   BMI 38.19 kg/m urine dipstick negative. Skin warm and dry. Lungs: clear to ausculation bilaterally. Cardiovascular: regular rate and rhythm.Incision not tender but still has some discomfort above it, and pantie girdle has helped.No redness or swelling or hard areas.  PHQ 9 score 4.Will add lexapro to Buspar     Assessment:     1. Anxiety   2.  History of UTI    Plan:     Continue Buspar, will add lexapro F/U in 6 weeks  Continue to wear pantie girdle for support  Note given to return to work 06/16/18 no lifting for 4 weeks

## 2018-06-11 DIAGNOSIS — N1330 Unspecified hydronephrosis: Secondary | ICD-10-CM | POA: Diagnosis not present

## 2018-06-11 DIAGNOSIS — R3915 Urgency of urination: Secondary | ICD-10-CM | POA: Diagnosis not present

## 2018-06-11 DIAGNOSIS — N200 Calculus of kidney: Secondary | ICD-10-CM | POA: Diagnosis not present

## 2018-06-26 DIAGNOSIS — R3915 Urgency of urination: Secondary | ICD-10-CM | POA: Diagnosis not present

## 2018-06-26 DIAGNOSIS — R8279 Other abnormal findings on microbiological examination of urine: Secondary | ICD-10-CM | POA: Diagnosis not present

## 2018-06-26 DIAGNOSIS — N1330 Unspecified hydronephrosis: Secondary | ICD-10-CM | POA: Diagnosis not present

## 2018-06-26 DIAGNOSIS — N3000 Acute cystitis without hematuria: Secondary | ICD-10-CM | POA: Diagnosis not present

## 2018-06-26 DIAGNOSIS — N200 Calculus of kidney: Secondary | ICD-10-CM | POA: Diagnosis not present

## 2018-07-09 DIAGNOSIS — N1330 Unspecified hydronephrosis: Secondary | ICD-10-CM | POA: Diagnosis not present

## 2018-07-09 DIAGNOSIS — N200 Calculus of kidney: Secondary | ICD-10-CM | POA: Diagnosis not present

## 2018-07-24 ENCOUNTER — Ambulatory Visit (INDEPENDENT_AMBULATORY_CARE_PROVIDER_SITE_OTHER): Payer: Federal, State, Local not specified - PPO | Admitting: Adult Health

## 2018-07-24 ENCOUNTER — Encounter: Payer: Self-pay | Admitting: Adult Health

## 2018-07-24 VITALS — BP 112/78 | HR 98 | Ht 66.0 in | Wt 238.5 lb

## 2018-07-24 DIAGNOSIS — F419 Anxiety disorder, unspecified: Secondary | ICD-10-CM

## 2018-07-24 NOTE — Progress Notes (Signed)
Patient ID: [name], female   DOB: 26-May-1986, 33 y.o.   MRN: [id number] History of Present Illness: Zephaniah is a 33 year old white female, back in follow up on adding lexapro to Buspar for anxiety. 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    Current Medications, Allergies, Past Medical History, Past Surgical History, Family History and Social History were reviewed in [employer] record.     Review of Systems: Feels mush better, anxiety is better Abdomen feels better too,where was sore after C-section   Physical Exam:BP 112/78 (BP Location: Left Arm, Patient Position: Sitting, Cuff Size: Large)   Pulse 98   Ht 5\' 6"  (1.676 m)   Wt 238 lb 8 oz (108.2 kg)   LMP 07/03/2018 (Approximate)   BMI 38.49 kg/m  General:  Well developed, well nourished, no acute distress Skin:  Warm and dry Lungs; Clear to auscultation bilaterally Cardiovascular: Regular rate and rhythm Psych:  No mood changes, alert and cooperative,seems happy PHQ 2 score 1. Fall risk is low.  Impression: 1. Anxiety       Plan: Continue Buspar and Lexapro, is actually taking Buspar bid instead to tid, call when needs refills Return in 6 months for physical

## 2018-10-15 DIAGNOSIS — R3915 Urgency of urination: Secondary | ICD-10-CM | POA: Diagnosis not present

## 2018-10-15 DIAGNOSIS — N200 Calculus of kidney: Secondary | ICD-10-CM | POA: Diagnosis not present

## 2018-10-15 DIAGNOSIS — N1330 Unspecified hydronephrosis: Secondary | ICD-10-CM | POA: Diagnosis not present

## 2019-01-06 DIAGNOSIS — N200 Calculus of kidney: Secondary | ICD-10-CM | POA: Diagnosis not present

## 2019-01-28 ENCOUNTER — Other Ambulatory Visit: Payer: Self-pay

## 2019-01-28 ENCOUNTER — Other Ambulatory Visit: Payer: Federal, State, Local not specified - PPO | Admitting: Adult Health

## 2019-01-28 ENCOUNTER — Encounter: Payer: Self-pay | Admitting: Women's Health

## 2019-01-28 ENCOUNTER — Ambulatory Visit (INDEPENDENT_AMBULATORY_CARE_PROVIDER_SITE_OTHER): Payer: Federal, State, Local not specified - PPO | Admitting: Women's Health

## 2019-01-28 VITALS — BP 113/79 | HR 96 | Ht 66.0 in | Wt 246.0 lb

## 2019-01-28 DIAGNOSIS — N9089 Other specified noninflammatory disorders of vulva and perineum: Secondary | ICD-10-CM | POA: Diagnosis not present

## 2019-01-28 NOTE — Progress Notes (Signed)
   GYN VISIT Patient name: Melody Stewart MRN [id number]  Date of birth: 1985-12-26 Chief Complaint:   Vaginitis  History of Present Illness:   Melody Stewart is a 32 y.o. G63P1001 Caucasian female being seen today for report of vulvovaginal irritation/itching during/after period x few months. Has been self-treating w/ OTC yeast creams w/ some relief, but comes back. Not sure if it could also be her tampons.  Uses NuvaRing and likes it.  Not really having any sx right now  Patient's last menstrual period was 01/14/2019 (approximate). The current method of family planning is NuvaRing vaginal inserts. Last pap 05/22/17. Results were:  normal Review of Systems:   Pertinent items are noted in HPI Denies fever/chills, dizziness, headaches, visual disturbances, fatigue, shortness of breath, chest pain, abdominal pain, vomiting, abnormal vaginal discharge/itching/odor/irritation, problems with periods, bowel movements, urination, or intercourse unless otherwise stated above.  Pertinent History Reviewed:  Reviewed past medical,surgical, social, obstetrical and family history.  Reviewed problem list, medications and allergies. Physical Assessment:   Vitals:   01/28/19 1358  BP: 113/79  Pulse: 96  Weight: 246 lb (111.6 kg)  Height: 5\' 6"  (1.676 m)  Body mass index is 39.71 kg/m.       Physical Examination:   General appearance: alert, well appearing, and in no distress  Mental status: alert, oriented to person, place, and time  Skin: warm & dry   Cardiovascular: normal heart rate noted  Respiratory: normal respiratory effort, no distress  Abdomen: soft, non-tender   Pelvic: VULVA: normal appearing vulva with no masses, tenderness or lesions, VAGINA: normal appearing vagina with normal color and discharge, no lesions, CERVIX: normal appearing cervix without discharge or lesions  Extremities: no edema   No results found for this or any previous visit (from the past 24 hour(s)).  Assessment  & Plan:  1) Frequent vulvovaginal irritation/itching> mainly w/ periods and right after, nuswab plus sent. If neg, try menstrual cup  Meds: No orders of the defined types were placed in this encounter.   Orders Placed This Encounter  Procedures  . NuSwab Vaginitis Plus (VG+)    Return for As scheduled.  [place], [facility] 01/28/2019 2:20 PM

## 2019-02-04 ENCOUNTER — Other Ambulatory Visit: Payer: Self-pay | Admitting: Adult Health

## 2019-02-05 LAB — NUSWAB VAGINITIS PLUS (VG+)
Candida albicans, NAA: NEGATIVE
Candida glabrata, NAA: NEGATIVE
Chlamydia trachomatis, NAA: NEGATIVE
Neisseria gonorrhoeae, NAA: NEGATIVE
Trich vag by NAA: NEGATIVE

## 2019-02-06 ENCOUNTER — Other Ambulatory Visit: Payer: Federal, State, Local not specified - PPO

## 2019-02-06 ENCOUNTER — Other Ambulatory Visit: Payer: Self-pay

## 2019-02-06 DIAGNOSIS — Z20822 Contact with and (suspected) exposure to covid-19: Secondary | ICD-10-CM

## 2019-02-06 DIAGNOSIS — R6889 Other general symptoms and signs: Secondary | ICD-10-CM | POA: Diagnosis not present

## 2019-02-10 LAB — NOVEL CORONAVIRUS, NAA: SARS-CoV-2, NAA: NOT DETECTED

## 2019-02-20 ENCOUNTER — Other Ambulatory Visit: Payer: Federal, State, Local not specified - PPO

## 2019-02-20 ENCOUNTER — Other Ambulatory Visit: Payer: Self-pay

## 2019-02-20 DIAGNOSIS — R6889 Other general symptoms and signs: Secondary | ICD-10-CM | POA: Diagnosis not present

## 2019-02-20 DIAGNOSIS — Z20822 Contact with and (suspected) exposure to covid-19: Secondary | ICD-10-CM

## 2019-02-22 LAB — NOVEL CORONAVIRUS, NAA: SARS-CoV-2, NAA: NOT DETECTED

## 2019-03-17 ENCOUNTER — Other Ambulatory Visit: Payer: Federal, State, Local not specified - PPO | Admitting: Adult Health

## 2019-04-07 ENCOUNTER — Other Ambulatory Visit: Payer: Federal, State, Local not specified - PPO | Admitting: Obstetrics and Gynecology

## 2019-04-15 ENCOUNTER — Other Ambulatory Visit: Payer: Federal, State, Local not specified - PPO | Admitting: Obstetrics and Gynecology

## 2019-06-15 ENCOUNTER — Other Ambulatory Visit: Payer: Self-pay

## 2019-06-15 ENCOUNTER — Other Ambulatory Visit (HOSPITAL_COMMUNITY)
Admission: RE | Admit: 2019-06-15 | Discharge: 2019-06-15 | Disposition: A | Discharge status: Home / Self Care | Payer: Self-pay | Source: Ambulatory Visit | Attending: Obstetrics and Gynecology | Admitting: Obstetrics and Gynecology

## 2019-06-15 ENCOUNTER — Encounter: Payer: Self-pay | Admitting: Obstetrics and Gynecology

## 2019-06-15 ENCOUNTER — Ambulatory Visit (INDEPENDENT_AMBULATORY_CARE_PROVIDER_SITE_OTHER): Payer: Self-pay | Admitting: Obstetrics and Gynecology

## 2019-06-15 VITALS — BP 120/78 | HR 74 | Ht 66.0 in | Wt 239.6 lb

## 2019-06-15 DIAGNOSIS — Z01419 Encounter for gynecological examination (general) (routine) without abnormal findings: Secondary | ICD-10-CM | POA: Insufficient documentation

## 2019-06-15 MED ORDER — ETONOGESTREL-ETHINYL ESTRADIOL 0.12-0.015 MG/24HR VA RING: VAGINAL_RING | VAGINAL | 12 refills | Status: DC

## 2019-06-15 NOTE — Progress Notes (Signed)
Patient ID: Melody Stewart, female   DOB: Jan 12, 1986, 33 y.o.   MRN: [id number]  Assessment:  Annual Gyn Exam Contraception management Plan:  1. pap smear done, next pap due 5 years 2. [place] refil 3    Annual mammogram advised after age 12 Subjective:  Melody Stewart is a 33 y.o. female G1P1001 who presents for annual exam. Patient's last menstrual period was 06/13/2019. The patient has complaints today of improving yeast infections. She switched tampons and has seen some improvement. She has no other complaints. Family history on mothers side have had breast cysts with family hx of breast cancer on fathers side.  The following portions of the patient's history were reviewed and updated as appropriate: allergies, current medications, past family history, past medical history, past social history, past surgical history and problem list. Past Medical History:  Diagnosis Date  . Anemia   . Anxiety and depression 09/14/2015  . Body aches   . Dysuria   . Family history of adverse reaction to anesthesia    mother-- hard to wake  . Fibroadenoma of right breast   . Frequency of urination   . GERD (gastroesophageal reflux disease)   . Hydronephrosis 03/24/2018   Bilateral, Right-mild to moderate, Left mild, noted on US renal  . IBS (irritable bowel syndrome)   . Mild asthma   . Nephrolithiasis   . Pre-eclampsia 03/2018   Severe  . Urgency of urination   . UTI (urinary tract infection)     Past Surgical History:  Procedure Laterality Date  . CESAREAN SECTION N/A 04/17/2018   Procedure: CESAREAN SECTION;  Surgeon: Lavonia Drafts, MD;  Location: [facility];  Service: Obstetrics;  Laterality: N/A;  . CYSTOSCOPY W/ URETERAL STENT PLACEMENT Bilateral 03/16/2018   Procedure: CYSTOSCOPY WITH RETROGRADE PYELOGRAM/URETERAL STENT PLACEMENT;  Surgeon: Festus Aloe, MD;  Location: [facility];  Service: Urology;  Laterality: Bilateral;  . CYSTOSCOPY/URETEROSCOPY/HOLMIUM  LASER/STENT PLACEMENT Bilateral 05/06/2018   Procedure: CYSTOSCOPY/URETEROSCOPY/HOLMIUM LASER/STENT EXCHANGE STONE BASKET RETRIVAL;  Surgeon: Festus Aloe, MD;  Location: [facility];  Service: Urology;  Laterality: Bilateral;  . KNEE SURGERY Left age 29  . TONSILLECTOMY    . WISDOM TOOTH EXTRACTION       Current Outpatient Medications:  .  etonogestrel-ethinyl estradiol (NUVARING) 0.12-0.015 MG/24HR vaginal ring, Insert vaginally and leave in place for 3 consecutive weeks, then remove for 1 week., Disp: 1 each, Rfl: 12 .  busPIRone (BUSPAR) 5 MG tablet, Take 1 tablet (5 mg total) by mouth 3 (three) times daily. (Patient not taking: Reported on 06/15/2019), Disp: 60 tablet, Rfl: 3 .  escitalopram (LEXAPRO) 10 MG tablet, Take 1 tablet (10 mg total) by mouth daily. (Patient not taking: Reported on 06/15/2019), Disp: 30 tablet, Rfl: 6 .  VENTOLIN HFA 108 (90 Base) MCG/ACT inhaler, Inhale 2 puffs into the lungs every 6 (six) hours as needed for wheezing. , Disp: , Rfl:   Review of Systems Constitutional: negative Gastrointestinal: negative Genitourinary: normal  Objective:  BP 120/78 (BP Location: Right Arm, Patient Position: Sitting, Cuff Size: Normal)   Pulse 74   Ht 5\' 6"  (1.676 m)   Wt 239 lb 9.6 oz (108.7 kg)   LMP 06/13/2019   BMI 38.67 kg/m    BMI: Body mass index is 38.67 kg/m.  General Appearance: Alert, appropriate appearance for age. No acute distress HEENT: Grossly normal Neck / Thyroid:  Cardiovascular: RRR; normal S1, S2, no murmur Lungs: CTA bilaterally Back: No CVAT Breast Exam: Small cyst in  right breast but has since gone away with no masses or nodes.No dimpling, nipple retraction or discharge. Gastrointestinal: Soft, non-tender, no masses or organomegaly Pelvic Exam:  VAGINA: normal appearing vagina, good vaginal length, with blood consistent with menses CERVIX: normal appearing cervix  UTERUS: uterus is normal and nontender,  PAP: Pap smear  done today. Lymphatic Exam: Non-palpable nodes in neck, clavicular, axillary, or inguinal regions Skin: no rash or abnormalities Neurologic: Normal gait and speech, no tremor  Psychiatric: Alert and oriented, appropriate affect.  Urinalysis:Not done  By signing my name below, I, Samul Dada, attest that this documentation has been prepared under the direction and in the presence of Jonnie Kind, MD. Electronically Signed: [place]. 06/15/19. 8:58 AM.  I personally performed the services described in this documentation, which was SCRIBED in my presence. The recorded information has been reviewed and considered accurate. It has been edited as necessary during review. Jonnie Kind, MD

## 2019-06-19 LAB — CYTOLOGY - PAP
Chlamydia: NEGATIVE
Comment: NEGATIVE
Comment: NEGATIVE
Comment: NORMAL
Diagnosis: UNDETERMINED — AB
High risk HPV: NEGATIVE
Neisseria Gonorrhea: NEGATIVE

## 2020-02-23 IMAGING — US US RENAL
1 series · 15 of 25 positions shown · non-contrast
Comparison: None.

CLINICAL DATA: Low back pain radiating to right side, 26 weeks
pregnant, persistent UTI

EXAM:
RENAL / URINARY TRACT ULTRASOUND COMPLETE

[Series 1: us renal · 15 of 35 slices shown]
[im 1/35]
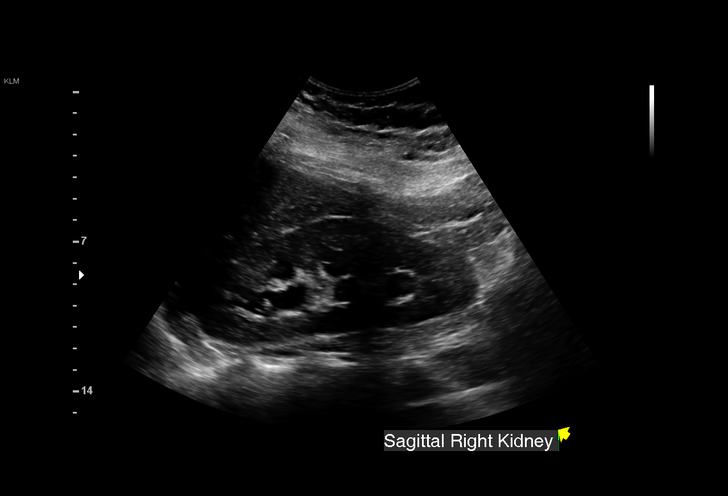
[im 3/35]
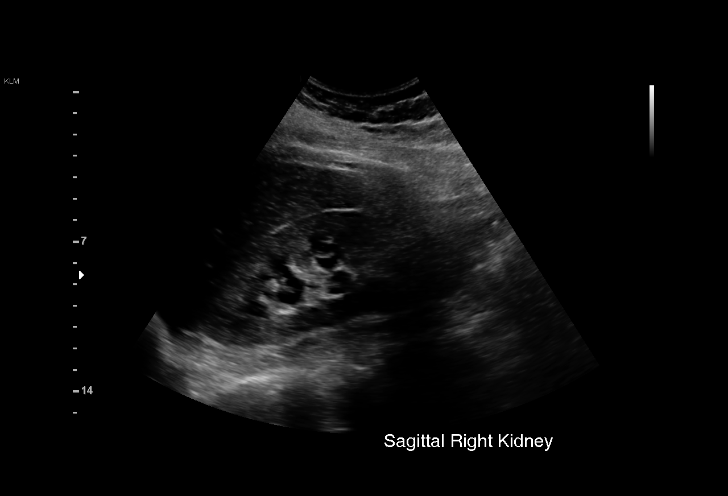
[im 6/35]
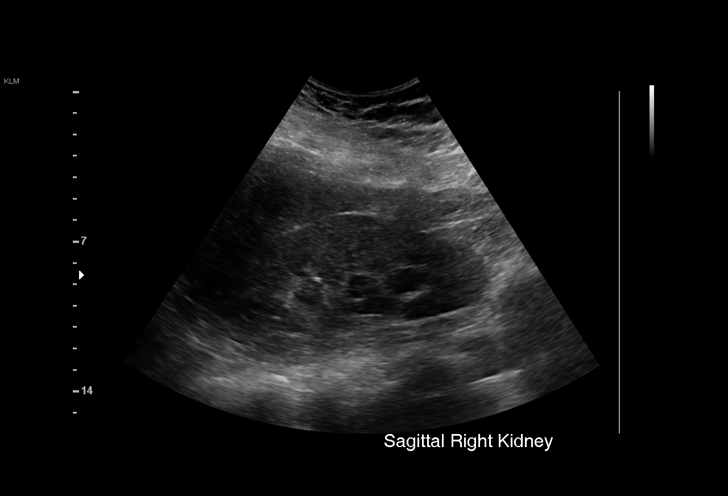
[im 8/35]
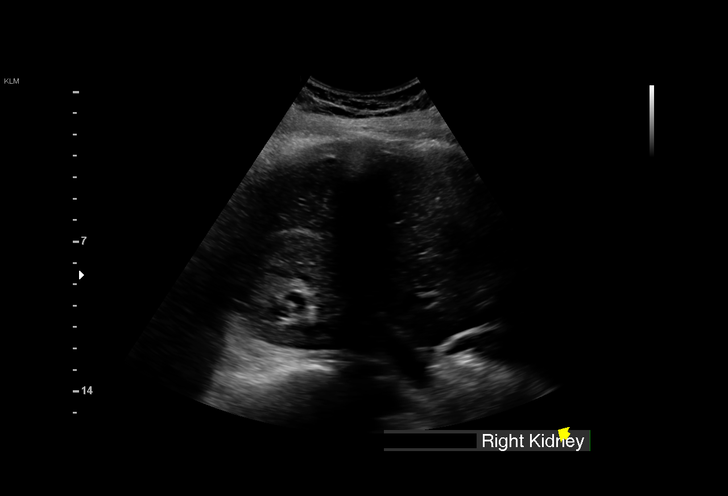
[im 10/35]
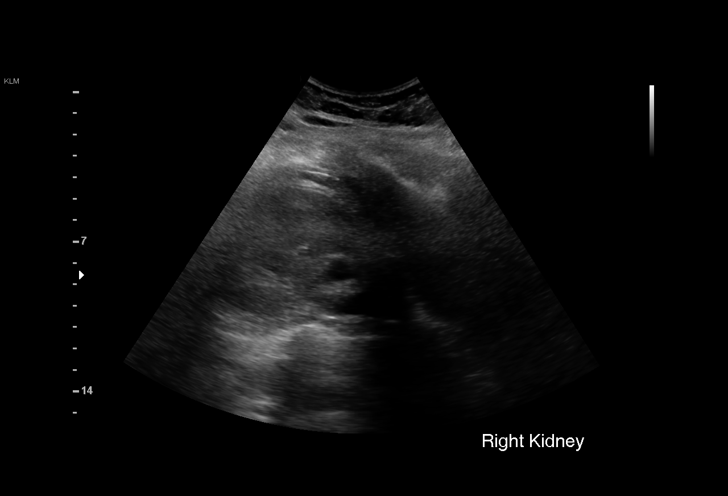
[im 13/35]
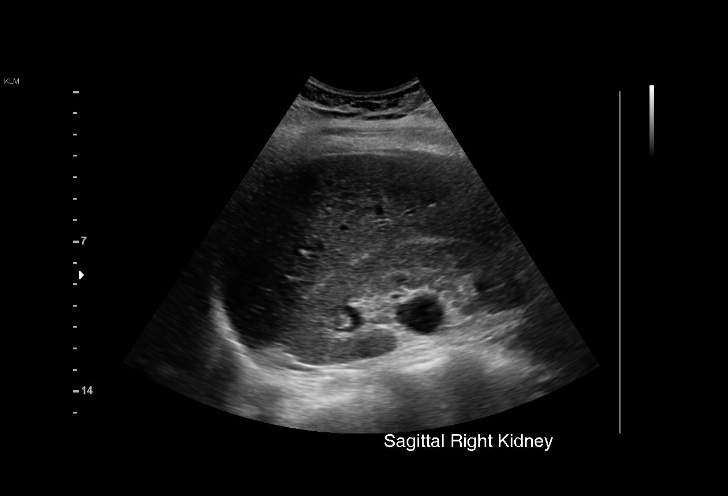
[im 15/35]
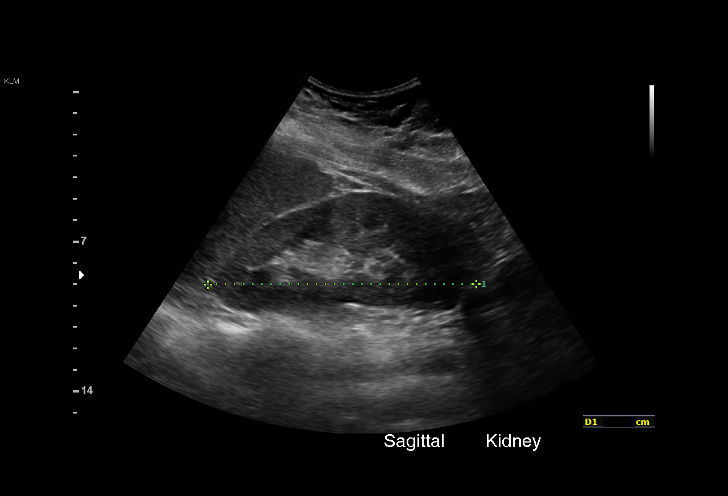
[im 18/35]
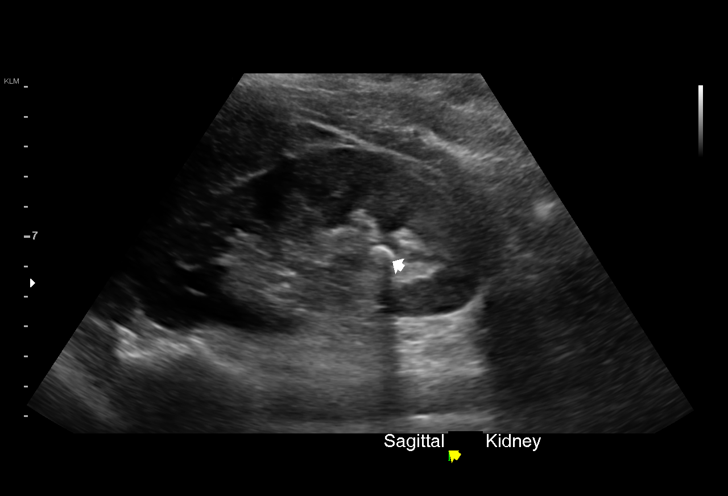
[im 20/35]
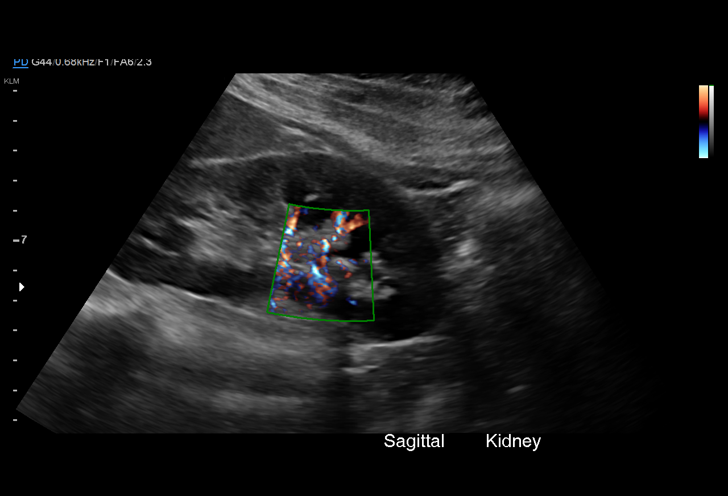
[im 22/35]
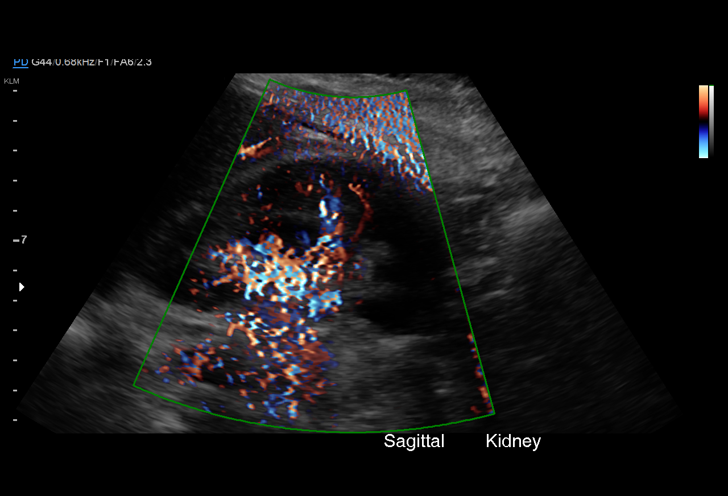
[im 25/35]
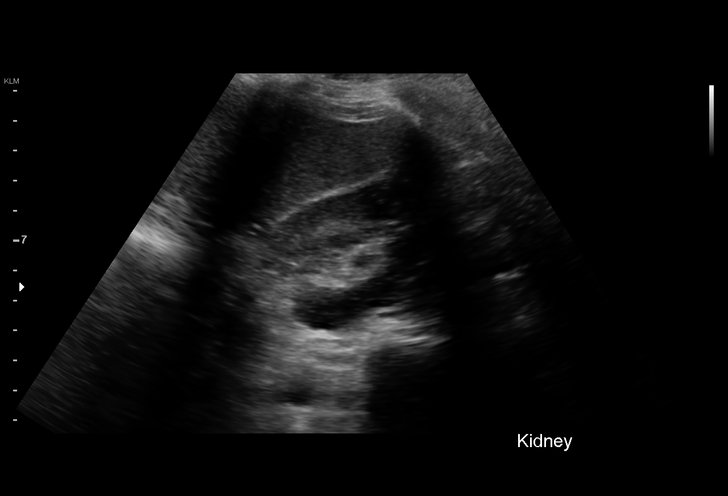
[im 27/35]
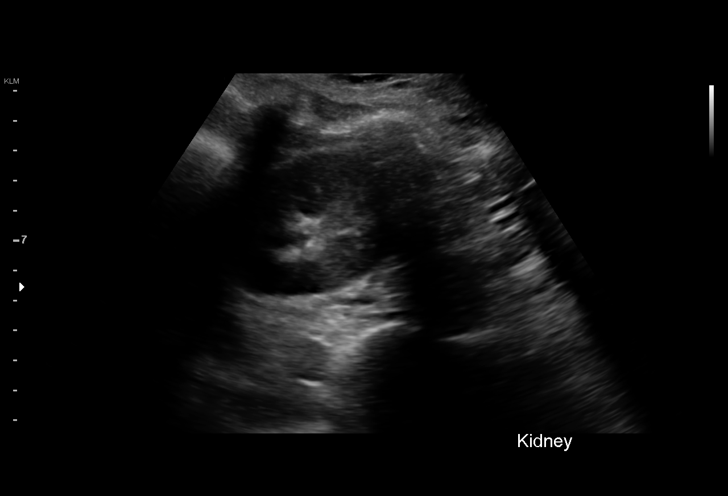
[im 29/35]
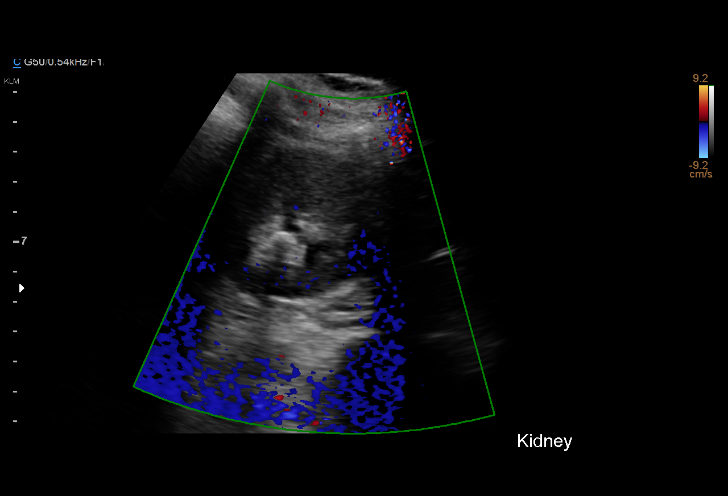
[im 32/35]
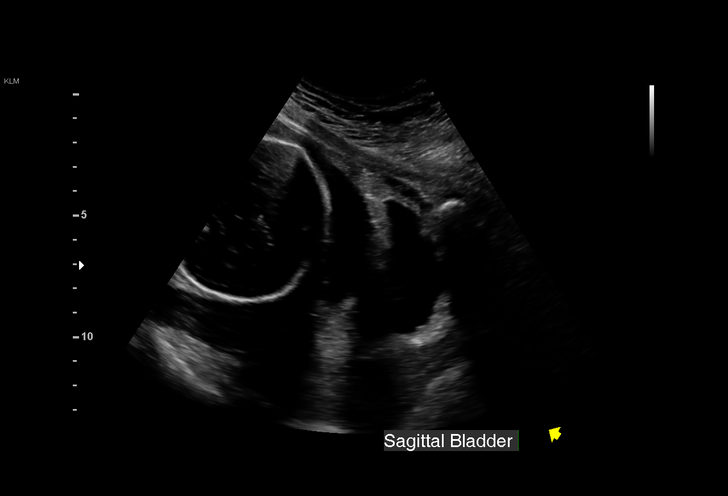
[im 35/35]
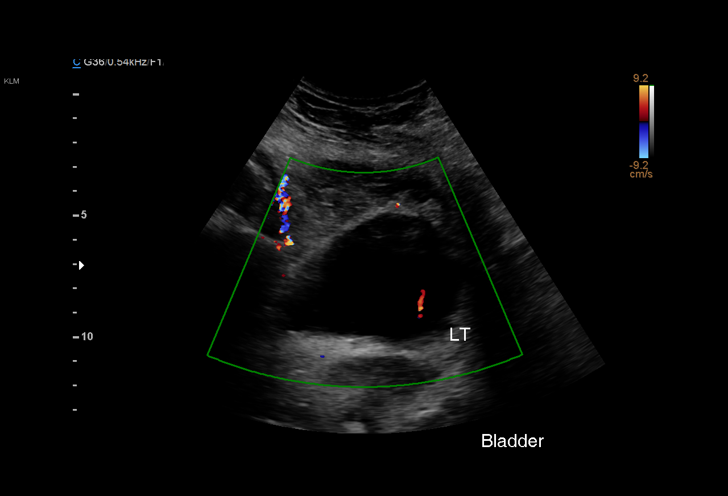

[15 of 25 positions shown; findings below may reference images not displayed]

FINDINGS: Right Kidney:

Length: 13.2 cm.  Moderate right hydronephrosis.

Left Kidney:

Length: 12.5 cm.  8 mm nonobstructing calculus.  No hydronephrosis.

Bladder:

Within normal limits.  Right bladder jet not visualized.
IMPRESSION: Moderate right hydronephrosis. In the setting of pregnancy, this may
be physiologic related to extrinsic compression. However, this
remains technically indeterminate.

8 mm nonobstructing left lower pole renal calculus. No
hydronephrosis.

## 2020-04-19 IMAGING — US US RENAL
1 series · 15 of 25 positions shown · non-contrast
Comparison: 03/15/2017, 03/11/2017 01/27/2017

CLINICAL DATA: Bilateral stent placement 03/16/2018, urinating
blood, patient is 34 weeks pregnant

EXAM:
RENAL / URINARY TRACT ULTRASOUND COMPLETE

[Series 1: us renal · 34 acquisitions, 15 frames shown]
[im 1/34]
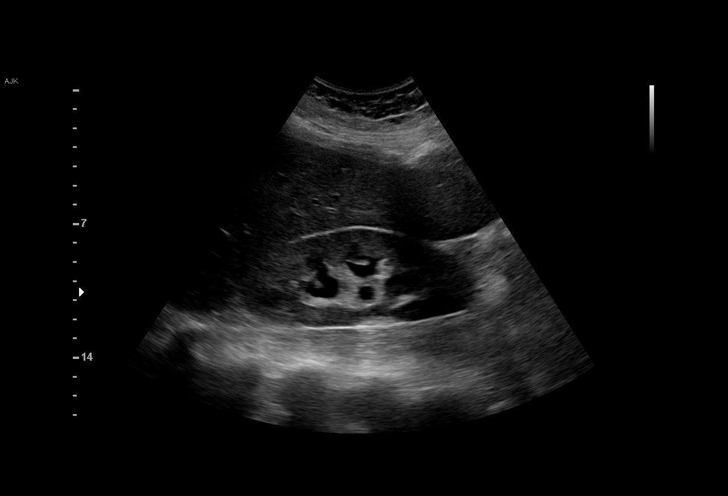
[im 3/34]
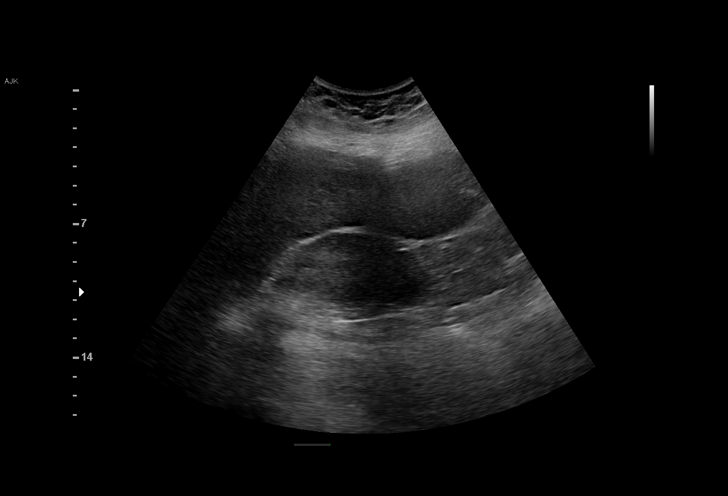
[im 6/34]
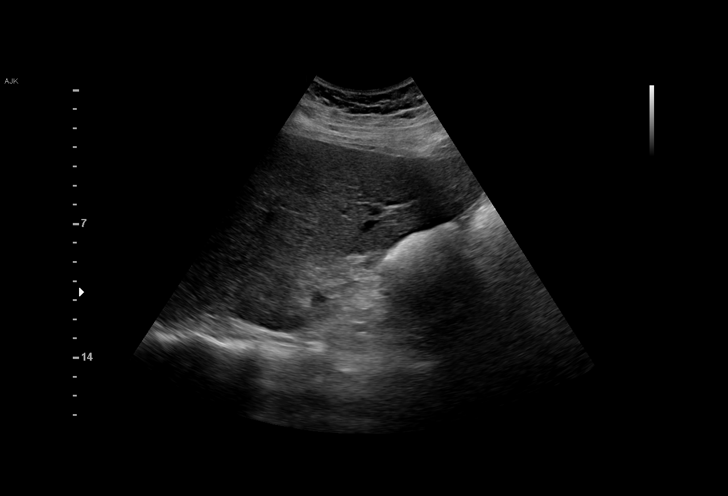
[im 7/34]
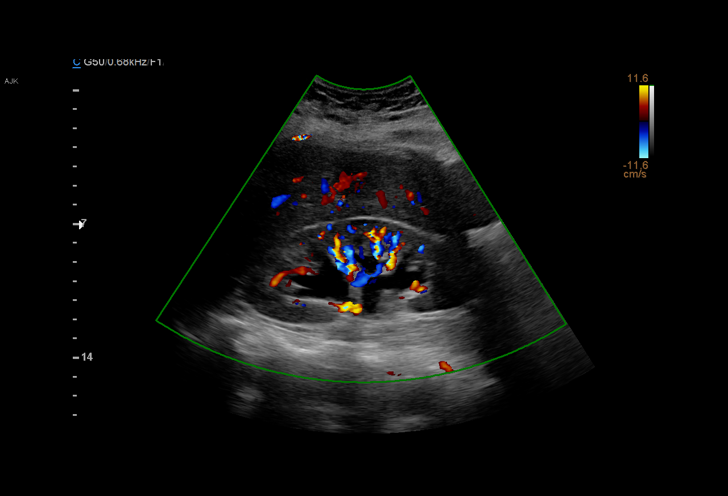
[im 10/34]
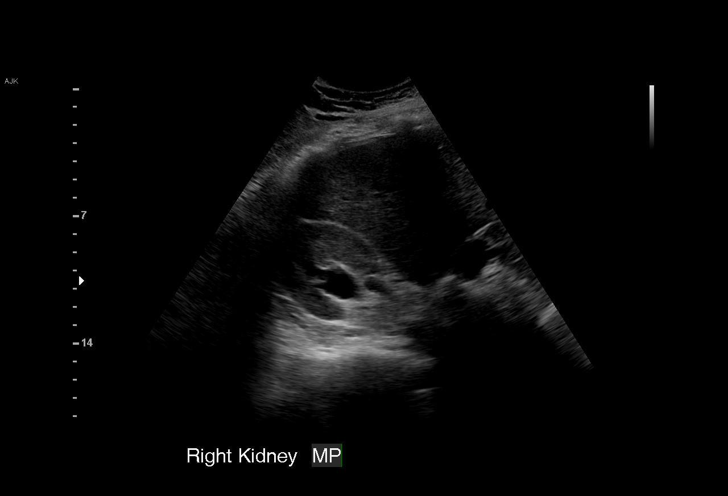
[im 13/34]
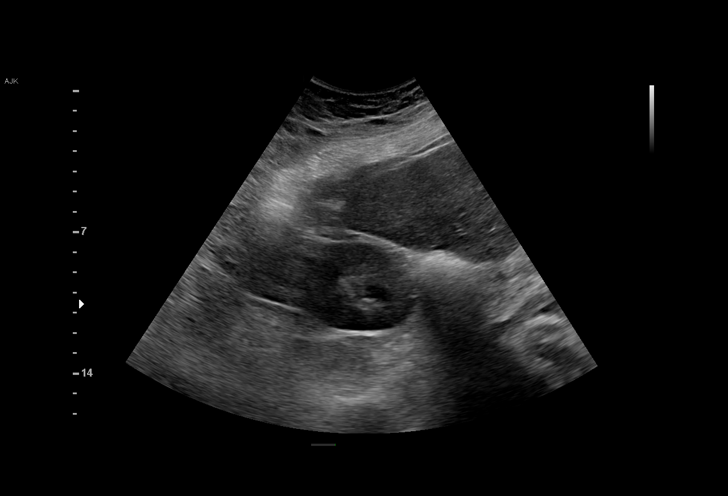
[im 14/34]
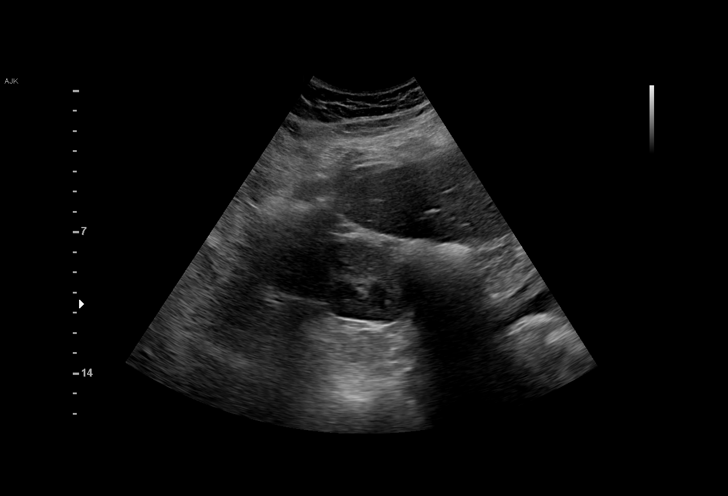
[im 17/34]
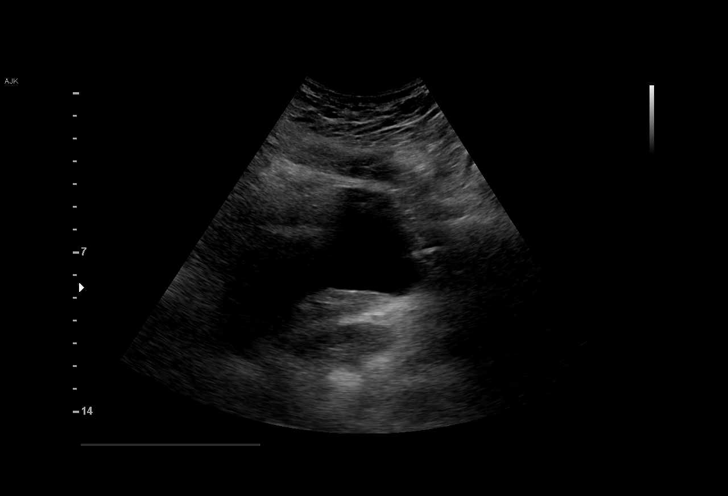
[im 20/34]
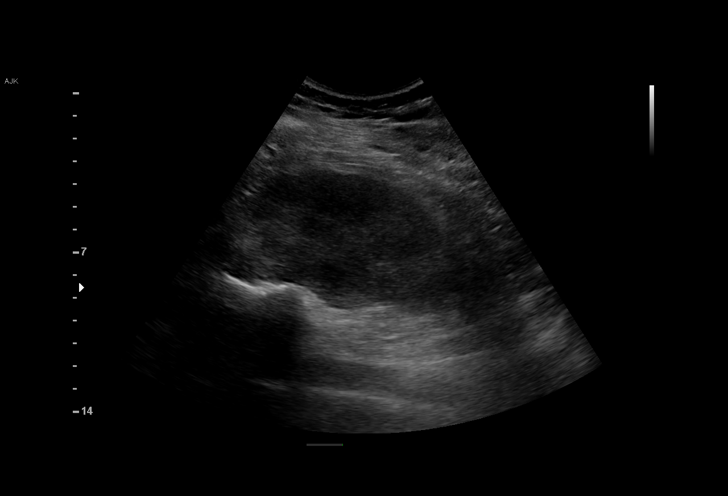
[im 21/34]
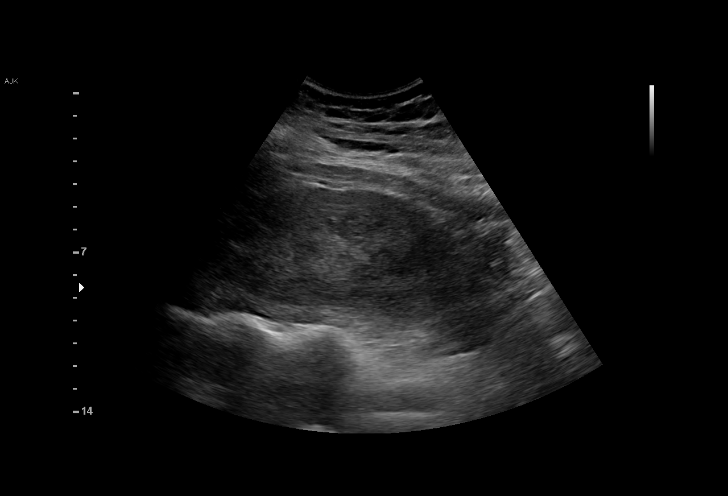
[im 24/34]
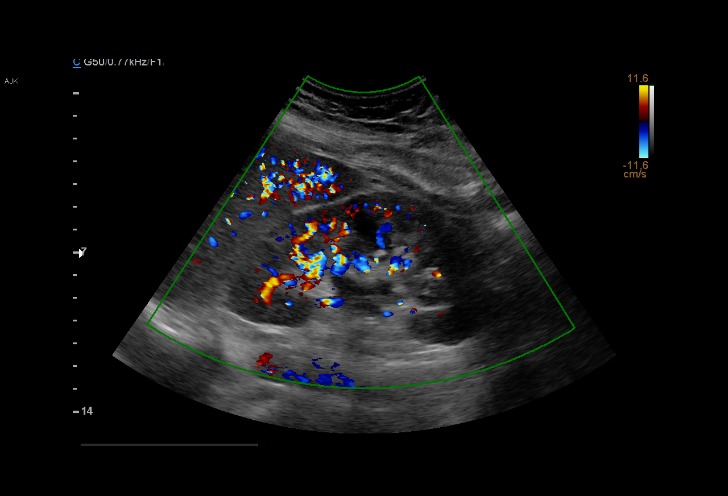
[im 27/34]
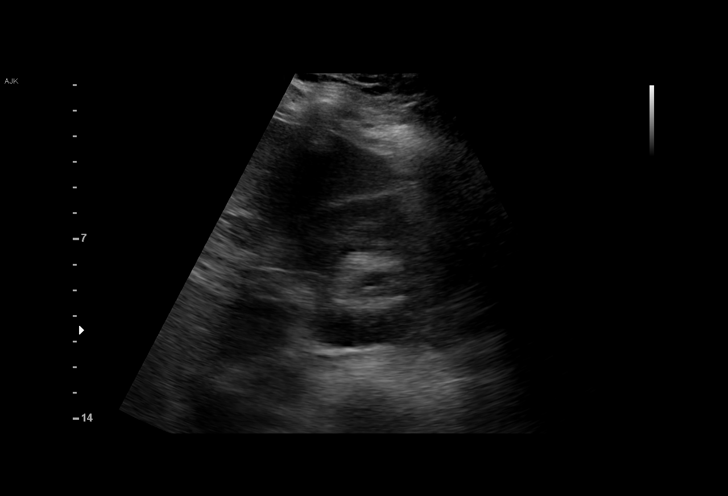
[im 28/34]
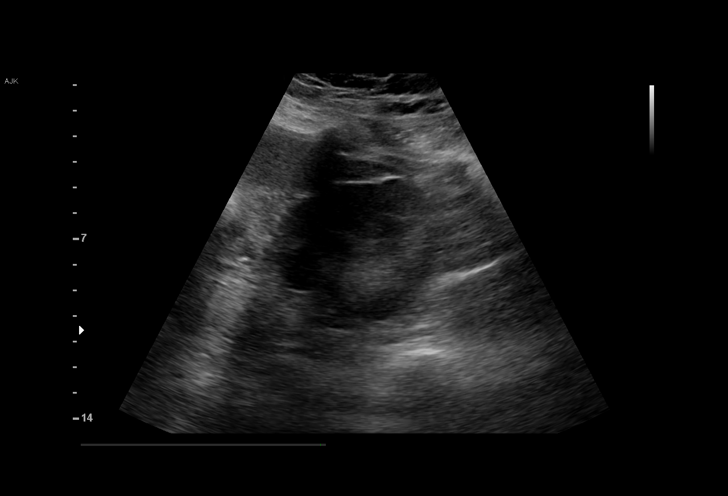
[im 31/34]
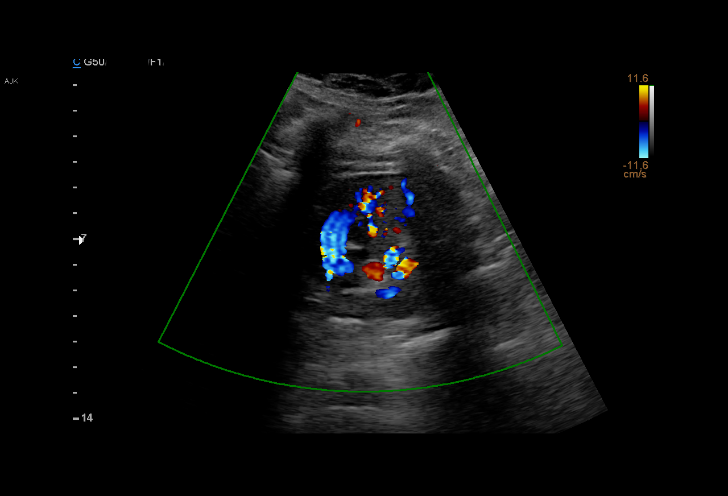
[im 34/34]
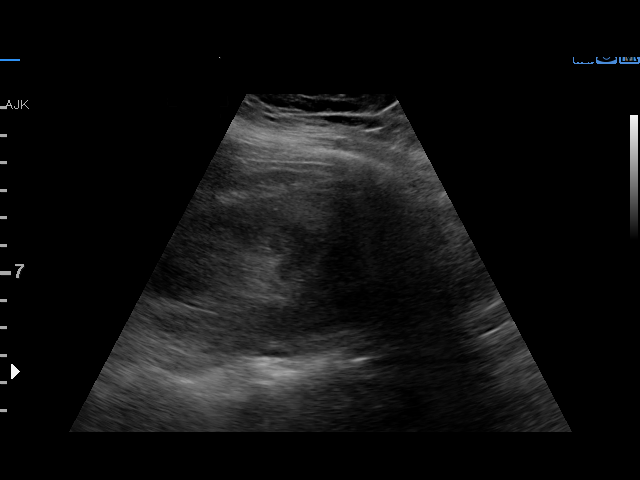

[15 of 25 positions shown; findings below may reference images not displayed]

FINDINGS: Right Kidney:

Length: 12.4 cm. Cortical echogenicity is within normal limits. Mild
to moderate right hydronephrosis, decreased compared with
03/15/2018.

Left Kidney:

Length: 12.4 cm. Mild left hydronephrosis, decreased compared with
03/15/2018. Cortical echogenicity within normal limits. Previously
noted obstructing proximal stone not clearly depicted on the current
images.

Bladder:

Partially visible stents within the bladder.
IMPRESSION: 1. Bilateral hydronephrosis, mild to moderate on the right and mild
on the left, decreased compared to prior sonogram from 03/15/2018
2. Partially visible stents within the urinary bladder

## 2020-08-24 ENCOUNTER — Other Ambulatory Visit: Payer: Self-pay

## 2020-08-24 MED ORDER — ETONOGESTREL-ETHINYL ESTRADIOL 0.12-0.015 MG/24HR VA RING: VAGINAL_RING | VAGINAL | 0 refills | Status: DC

## 2020-09-20 ENCOUNTER — Ambulatory Visit (INDEPENDENT_AMBULATORY_CARE_PROVIDER_SITE_OTHER): Payer: 59 | Admitting: Adult Health

## 2020-09-20 ENCOUNTER — Encounter: Payer: Self-pay | Admitting: Adult Health

## 2020-09-20 ENCOUNTER — Other Ambulatory Visit: Payer: Self-pay

## 2020-09-20 ENCOUNTER — Other Ambulatory Visit (HOSPITAL_COMMUNITY)
Admission: RE | Admit: 2020-09-20 | Discharge: 2020-09-20 | Disposition: A | Discharge status: Home / Self Care | Payer: 59 | Source: Ambulatory Visit | Attending: Adult Health | Admitting: Adult Health

## 2020-09-20 VITALS — BP 118/72 | HR 85 | Ht 66.0 in | Wt 255.0 lb

## 2020-09-20 DIAGNOSIS — Z01419 Encounter for gynecological examination (general) (routine) without abnormal findings: Secondary | ICD-10-CM | POA: Insufficient documentation

## 2020-09-20 DIAGNOSIS — Z124 Encounter for screening for malignant neoplasm of cervix: Secondary | ICD-10-CM

## 2020-09-20 DIAGNOSIS — Z30015 Encounter for initial prescription of vaginal ring hormonal contraceptive: Secondary | ICD-10-CM

## 2020-09-20 DIAGNOSIS — Z8742 Personal history of other diseases of the female genital tract: Secondary | ICD-10-CM

## 2020-09-20 MED ORDER — ETONOGESTREL-ETHINYL ESTRADIOL 0.12-0.015 MG/24HR VA RING: VAGINAL_RING | VAGINAL | 12 refills | Status: DC

## 2020-09-20 NOTE — Progress Notes (Addendum)
Patient ID: Melody Stewart, female   DOB: 17-Sep-1985, 35 y.o.   MRN: [id number] History of Present Illness: Melody Stewart is a 35 year old white female,single, G1P1 in for a well woman gyn exam and pap. She stopped nuva ring and had hot flashes, acne and moody so started it back, and seems better. Had ASCUS pap 06/15/2019, with negative HPV. PCP is Dr Melody Stewart.   Current Medications, Allergies, Past Medical History, Past Surgical History, Family History and Social History were reviewed in [employer] record.     Review of Systems: Patient denies any headaches, hearing loss, fatigue, blurred vision, shortness of breath, chest pain, abdominal pain, problems with bowel movements, urination, or intercourse.(not active) No joint pain. See HPI for positives.    Physical Exam:BP 118/72 (BP Location: Left Arm, Patient Position: Sitting, Cuff Size: Normal)   Pulse 85   Ht 5\' 6"  (1.676 m)   Wt 255 lb (115.7 kg)   LMP 08/23/2020   BMI 41.16 kg/m  General:  Well developed, well nourished, no acute distress Skin:  Warm and dry Neck:  Midline trachea, normal thyroid, good ROM, no lymphadenopathy Lungs; Clear to auscultation bilaterally Breast:  No dominant palpable mass, retraction, or nipple discharge Cardiovascular: Regular rate and rhythm Abdomen:  Soft, non tender, no hepatosplenomegaly Pelvic:  External genitalia is normal in appearance, no lesions.  The vagina is normal in appearance.Ring in vagina.  Urethra has no lesions or masses. The cervix is bulbous,pap with GC/CHL and HR HPV genotyping performed .  Uterus is felt to be normal size, shape, and contour.  No adnexal masses or tenderness noted.Bladder is non tender, no masses felt. 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 AA is 1 Fall risk is low PHQ 9 score is 6,she declines meds  is going to a  Social worker.  GAD 7 score is 3  [facility] - 09/20/20 1131      Pregnancy Intention Screening   Does the patient want to become pregnant in the next year? No    Does the patient's partner want to become pregnant in the next year? No    Would the patient like to discuss contraceptive options today? Yes      Contraception Wrap Up   Current Method Vaginal Ring    End Method Vaginal Ring    Contraception Counseling Provided Yes         Discussed options and will stay with nuva ring   Impression and Plan: 1. Encounter for gynecological examination with Papanicolaou smear of cervix Pap sent Physical in 1 year Pap in 3 if normal Labs with PCP  Mammogram at 39 Make some changes to lose weight, decrease portions, walk more  2. Encounter for initial prescription of vaginal ring hormonal contraceptive Meds ordered this encounter  Medications  . etonogestrel-ethinyl estradiol (NUVARING) 0.12-0.015 MG/24HR vaginal ring    Sig: Insert vaginally and leave in place for 3 consecutive weeks, then remove for 1 week.    Dispense:  3 each    Refill:  12    Order Specific Question:   Supervising Provider    Answer:   Elonda Husky, LUTHER H [[id number]]    3. History of abnormal cervical Pap smear Pap sent

## 2020-09-22 LAB — CYTOLOGY - PAP
Chlamydia: NEGATIVE
Comment: NEGATIVE
Comment: NEGATIVE
Comment: NORMAL
Diagnosis: NEGATIVE
Diagnosis: REACTIVE
High risk HPV: NEGATIVE
Neisseria Gonorrhea: NEGATIVE

## 2021-03-04 ENCOUNTER — Ambulatory Visit
Admission: EM | Admit: 2021-03-04 | Discharge: 2021-03-04 | Disposition: A | Discharge status: Home / Self Care | Payer: 59 | Attending: Emergency Medicine | Admitting: Emergency Medicine

## 2021-03-04 ENCOUNTER — Encounter: Payer: Self-pay | Admitting: Emergency Medicine

## 2021-03-04 DIAGNOSIS — H66002 Acute suppurative otitis media without spontaneous rupture of ear drum, left ear: Secondary | ICD-10-CM | POA: Diagnosis not present

## 2021-03-04 DIAGNOSIS — R059 Cough, unspecified: Secondary | ICD-10-CM

## 2021-03-04 DIAGNOSIS — J4521 Mild intermittent asthma with (acute) exacerbation: Secondary | ICD-10-CM | POA: Diagnosis not present

## 2021-03-04 MED ORDER — PREDNISONE 10 MG (21) PO TBPK: ORAL_TABLET | Freq: Every day | ORAL | 0 refills | Status: DC

## 2021-03-04 MED ORDER — CEFDINIR 300 MG PO CAPS: 300.0000 mg | ORAL_CAPSULE | Freq: Two times a day (BID) | ORAL | 0 refills | Status: AC

## 2021-03-04 MED ORDER — DEXAMETHASONE SODIUM PHOSPHATE 10 MG/ML IJ SOLN
10.0000 mg | Freq: Once | INTRAMUSCULAR | Status: AC
  Administered 2021-03-04: 10 mg via INTRAMUSCULAR

## 2021-03-04 NOTE — ED Triage Notes (Addendum)
Shortness of breath, chest congestion, coughing up small amount clear mucous x 3 weeks.  Seen x 3weeks ago given abx and prednisone with no relief.  Using albuterol inhaler with no relief.

## 2021-03-04 NOTE — ED Provider Notes (Signed)
[facility]   SP:[id number] 03/04/21 Arrival Time: [id number]   CC: Cough  SUBJECTIVE: History from: patient.  Melody Stewart is a 35 y.o. female who presents nonproductive cough x 3 days.   Recently treated with augmentin and steroid for sinus infection.  Reports temporary improvement.  Denies sick exposure to COVID, flu or strep.  Reports worsening symptoms over the past few days.  Reports ear pain, SOB With cough and wheezing.  Hx significant for asthma, using inhaler.  Denies fever, chills, chest pain, nausea, changes in bowel or bladder habits.     ROS: As per HPI.  All other pertinent ROS negative.     Past Medical History:  Diagnosis Date   Anemia    Anxiety and depression 09/14/2015   Body aches    Dysuria    Family history of adverse reaction to anesthesia    mother-- hard to wake   Fibroadenoma of right breast    Frequency of urination    GERD (gastroesophageal reflux disease)    Hydronephrosis 03/24/2018   Bilateral, Right-mild to moderate, Left mild, noted on US renal   IBS (irritable bowel syndrome)    Mild asthma    Nephrolithiasis    Pre-eclampsia 03/2018   Severe   Urgency of urination    UTI (urinary tract infection)    Past Surgical History:  Procedure Laterality Date   CESAREAN SECTION N/A 04/17/2018   Procedure: CESAREAN SECTION;  Surgeon: Lavonia Drafts, MD;  Location: [facility];  Service: Obstetrics;  Laterality: N/A;   CYSTOSCOPY W/ URETERAL STENT PLACEMENT Bilateral 03/16/2018   Procedure: CYSTOSCOPY WITH RETROGRADE PYELOGRAM/URETERAL STENT PLACEMENT;  Surgeon: Festus Aloe, MD;  Location: [facility];  Service: Urology;  Laterality: Bilateral;   CYSTOSCOPY/URETEROSCOPY/HOLMIUM LASER/STENT PLACEMENT Bilateral 05/06/2018   Procedure: CYSTOSCOPY/URETEROSCOPY/HOLMIUM LASER/STENT EXCHANGE STONE BASKET RETRIVAL;  Surgeon: Festus Aloe, MD;  [facility];  Service: Urology;  Laterality: Bilateral;   KNEE  SURGERY Left age 59   TONSILLECTOMY     WISDOM TOOTH EXTRACTION     Allergies  Allergen Reactions   Hydrocodone Other (See Comments)    Make her sick and constipated.   No current facility-administered medications on file prior to encounter.   Current Outpatient Medications on File Prior to Encounter  Medication Sig Dispense Refill   etonogestrel-ethinyl estradiol (NUVARING) 0.12-0.015 MG/24HR vaginal ring Insert vaginally and leave in place for 3 consecutive weeks, then remove for 1 week. 3 each 12   FLOVENT HFA 44 MCG/ACT inhaler Inhale into the lungs.     VENTOLIN HFA 108 (90 Base) MCG/ACT inhaler Inhale 2 puffs into the lungs every 6 (six) hours as needed for wheezing.      Social History   Socioeconomic History   Marital status: Single    Spouse name: Not on file   Number of children: 1   Years of education: Not on file   Highest education level: Not on file  Occupational History   Not on file  Tobacco Use   Smoking status: Former    Years: 2.00    Types: Cigarettes    Quit date: 05/02/2014    Years since quitting: 6.8   Smokeless tobacco: Never  Vaping Use   Vaping Use: Never used  Substance and Sexual Activity   Alcohol use: Not Currently   Drug use: Never   Sexual activity: Yes    Birth control/protection: Inserts  Other Topics Concern   Not on file  Social History Narrative  Not on file   Social Determinants of Health   Financial Resource Strain: Low Risk    Difficulty of Paying Living Expenses: Not hard at all  Food Insecurity: No Food Insecurity   Worried About Charity fundraiser in the Last Year: Never true   [place] in the Last Year: Never true  Transportation Needs: No Transportation Needs   Lack of Transportation (Medical): No   Lack of Transportation (Non-Medical): No  Physical Activity: Inactive   Days of Exercise per Week: 0 days   Minutes of Exercise per Session: 0 min  Stress: Stress Concern Present   Feeling of Stress : To  some extent  Social Connections: Moderately Isolated   Frequency of Communication with Friends and Family: More than three times a week   Frequency of Social Gatherings with Friends and Family: Once a week   Attends Religious Services: More than 4 times per year   Active Member of [employer] or Organizations: No   Attends Music therapist: Never   Marital Status: Never married  Human resources officer Violence: Not At Risk   Fear of Current or Ex-Partner: No   Emotionally Abused: No   Physically Abused: No   Sexually Abused: No   Family History  Problem Relation Age of Onset   Hypertension Mother    Other Mother        mother had child hydrocephic-died   Hypertension Father    Heart disease Paternal Grandfather    Stroke Paternal Grandfather    Thyroid disease Maternal Grandmother    Heart disease Maternal Grandmother    Cancer Paternal Grandmother        breast   Thyroid disease Maternal Aunt    Cancer Paternal Aunt        breast    OBJECTIVE:  Vitals:   03/04/21 1402  BP: 112/70  Pulse: 98  Resp: 20  Temp: 98.5 F (36.9 C)  TempSrc: Oral  SpO2: 97%     General appearance: alert; appears fatigued, but nontoxic; speaking in full sentences and tolerating own secretions HEENT: NCAT; Ears: EACs clear, RT TM pearly gray, LT TM erythematous; Eyes: PERRL.  EOM grossly intact. Nose: nares patent without rhinorrhea, Throat: oropharynx clear, tonsils non erythematous or enlarged, uvula midline  Neck: supple without LAD Lungs: unlabored respirations, symmetrical air entry; cough: mild; no respiratory distress; CTAB Heart: regular rate and rhythm.   Skin: warm and dry Psychological: alert and cooperative; normal mood and affect  ASSESSMENT & PLAN:  1. Cough   2. Non-recurrent acute suppurative otitis media of left ear without spontaneous rupture of tympanic membrane   3. Mild intermittent asthma with exacerbation     Meds ordered this encounter  Medications    predniSONE (STERAPRED UNI-PAK 21 TAB) 10 MG (21) TBPK tablet    Sig: Take by mouth daily. Take 6 tabs by mouth daily  for 2 days, then 5 tabs for 2 days, then 4 tabs for 2 days, then 3 tabs for 2 days, 2 tabs for 2 days, then 1 tab by mouth daily for 2 days    Dispense:  42 tablet    Refill:  0    Order Specific Question:   Supervising Provider    Answer:   Raylene Everts [id number]   cefdinir (OMNICEF) 300 MG capsule    Sig: Take 1 capsule (300 mg total) by mouth 2 (two) times daily for 10 days.    Dispense:  20 capsule  Refill:  0    Order Specific Question:   Supervising Provider    Answer:   Raylene Everts [id number]   dexamethasone (DECADRON) injection 10 mg    Get plenty of rest and push fluids Steroid shot given in office Prednisone prescribed Cefdinir for ear infection Use OTC zyrtec for nasal congestion, runny nose, and/or sore throat Use OTC flonase for nasal congestion and runny nose Use medications daily for symptom relief Use OTC medications like ibuprofen or tylenol as needed fever or pain Follow up with PCP this week for recheck and to ensure symptoms are improving Call or go to the ED if you have any new or worsening symptoms such as fever, worsening cough, shortness of breath, chest tightness, chest pain, turning blue, changes in mental status, etc...   Reviewed expectations re: course of current medical issues. Questions answered. Outlined signs and symptoms indicating need for more acute intervention. Patient verbalized understanding. After Visit Summary given.          Lestine Box, PA-C 03/04/21 1418

## 2021-03-04 NOTE — Discharge Instructions (Addendum)
Get plenty of rest and push fluids Steroid shot given in office Prednisone prescribed Cefdinir for ear infection Use OTC zyrtec for nasal congestion, runny nose, and/or sore throat Use OTC flonase for nasal congestion and runny nose Use medications daily for symptom relief Use OTC medications like ibuprofen or tylenol as needed fever or pain Follow up with PCP this week for recheck and to ensure symptoms are improving Call or go to the ED if you have any new or worsening symptoms such as fever, worsening cough, shortness of breath, chest tightness, chest pain, turning blue, changes in mental status, etc..Marland Kitchen

## 2021-03-14 ENCOUNTER — Other Ambulatory Visit: Payer: Self-pay

## 2021-03-14 ENCOUNTER — Other Ambulatory Visit (HOSPITAL_COMMUNITY)
Admission: RE | Admit: 2021-03-14 | Discharge: 2021-03-14 | Disposition: A | Discharge status: Home / Self Care | Payer: 59 | Source: Ambulatory Visit | Attending: Obstetrics & Gynecology | Admitting: Obstetrics & Gynecology

## 2021-03-14 ENCOUNTER — Other Ambulatory Visit (INDEPENDENT_AMBULATORY_CARE_PROVIDER_SITE_OTHER): Payer: 59

## 2021-03-14 DIAGNOSIS — R399 Unspecified symptoms and signs involving the genitourinary system: Secondary | ICD-10-CM

## 2021-03-14 DIAGNOSIS — N898 Other specified noninflammatory disorders of vagina: Secondary | ICD-10-CM | POA: Insufficient documentation

## 2021-03-14 LAB — POCT URINALYSIS DIPSTICK OB
Blood, UA: NEGATIVE
Glucose, UA: NEGATIVE
Ketones, UA: NEGATIVE
Leukocytes, UA: NEGATIVE
Nitrite, UA: NEGATIVE
POC,PROTEIN,UA: NEGATIVE

## 2021-03-14 NOTE — Progress Notes (Signed)
   NURSE VISIT- VAGINITIS/STD  SUBJECTIVE:  Melody Stewart is a 35 y.o. G1P1001 GYN patientfemale here for a vaginal swab for vaginitis screening, STD screen.  She reports the following symptoms: burning, discharge described as clear, odorless, and vulvar erythema noted, local irritation, pain, tears, and vulvar itching for 2.5 weeks. Denies abnormal vaginal bleeding, significant pelvic pain, fever, or UTI symptoms.  OBJECTIVE:  LMP 02/25/2021   Appears well, in no apparent distress  ASSESSMENT: Vaginal swab for  vaginitis & STD screening  PLAN: Self-collected vaginal probe for Gonorrhea, Chlamydia, Trichomonas, Bacterial Vaginosis, Yeast sent to lab Treatment: to be determined once results are received Follow-up as needed if symptoms persist/worsen, or new symptoms develop    NURSE VISIT- UTI SYMPTOMS   SUBJECTIVE:  Melody Stewart is a 35 y.o. G29P1001 female here for UTI symptoms. She is a GYN patient. She reports  burning .  OBJECTIVE:  LMP 02/25/2021   Appears well, in no apparent distress  Results for orders placed or performed in visit on 03/14/21 (from the past 24 hour(s))  POC Urinalysis Dipstick OB   Collection Time: 03/14/21  9:41 AM  Result Value Ref Range   Color, UA     Clarity, UA     Glucose, UA Negative Negative   Bilirubin, UA     Ketones, UA neg    Spec Grav, UA     Blood, UA neg    pH, UA     POC,PROTEIN,UA Negative Negative, Trace, Small (1+), Moderate (2+), Large (3+), 4+   Urobilinogen, UA     Nitrite, UA neg    Leukocytes, UA Negative Negative   Appearance     Odor      ASSESSMENT: GYN patient with UTI symptoms and negative nitrites  PLAN: Note routed to Derrek Monaco, AGNP   Rx sent by provider today: No Urine culture not sent Call or return to clinic prn if these symptoms worsen or fail to improve as anticipated. Follow-up: as needed   Satcha Storlie A Marella Vanderpol  03/14/2021 9:41 AM

## 2021-03-14 NOTE — Progress Notes (Signed)
Chart reviewed for nurse visit. Agree with plan of care.  Estill Dooms, NP 03/14/2021 12:28 PM

## 2021-03-15 ENCOUNTER — Other Ambulatory Visit: Payer: Self-pay | Admitting: Adult Health

## 2021-03-15 LAB — CERVICOVAGINAL ANCILLARY ONLY
Bacterial Vaginitis (gardnerella): NEGATIVE
Candida Glabrata: NEGATIVE
Candida Vaginitis: POSITIVE — AB
Chlamydia: NEGATIVE
Comment: NEGATIVE
Comment: NEGATIVE
Comment: NEGATIVE
Comment: NEGATIVE
Comment: NEGATIVE
Comment: NORMAL
Neisseria Gonorrhea: NEGATIVE
Trichomonas: NEGATIVE

## 2021-03-15 MED ORDER — FLUCONAZOLE 150 MG PO TABS: ORAL_TABLET | ORAL | 1 refills | Status: DC

## 2021-03-15 NOTE — Progress Notes (Signed)
+  yeast on vaginal swab will rx diflucan  

## 2021-04-18 ENCOUNTER — Other Ambulatory Visit: Payer: Self-pay

## 2021-04-18 ENCOUNTER — Ambulatory Visit
Admission: EM | Admit: 2021-04-18 | Discharge: 2021-04-18 | Disposition: A | Discharge status: Home / Self Care | Payer: 59 | Attending: Internal Medicine | Admitting: Internal Medicine

## 2021-04-18 ENCOUNTER — Encounter: Payer: Self-pay | Admitting: Emergency Medicine

## 2021-04-18 DIAGNOSIS — Z20822 Contact with and (suspected) exposure to covid-19: Secondary | ICD-10-CM

## 2021-04-18 DIAGNOSIS — J069 Acute upper respiratory infection, unspecified: Secondary | ICD-10-CM

## 2021-04-18 MED ORDER — MONTELUKAST SODIUM 10 MG PO TABS: 10.0000 mg | ORAL_TABLET | Freq: Every day | ORAL | 2 refills | Status: DC

## 2021-04-18 MED ORDER — FLUTICASONE PROPIONATE 50 MCG/ACT NA SUSP: 1.0000 | Freq: Every day | NASAL | 0 refills | Status: DC

## 2021-04-18 MED ORDER — BENZONATATE 100 MG PO CAPS: 100.0000 mg | ORAL_CAPSULE | Freq: Three times a day (TID) | ORAL | 0 refills | Status: DC

## 2021-04-18 MED ORDER — GUAIFENESIN ER 600 MG PO TB12: 600.0000 mg | ORAL_TABLET | Freq: Two times a day (BID) | ORAL | Status: DC

## 2021-04-18 NOTE — Discharge Instructions (Addendum)
Warm salt water gargle Increase oral fluid intake We will call you with recommendations if labs abnormal Take medications as prescribed.

## 2021-04-18 NOTE — ED Triage Notes (Signed)
Sore throat, green nasal congestion since Thursday.

## 2021-04-19 NOTE — ED Provider Notes (Signed)
[facility]    CSN: [id number] Arrival date & time: 04/18/21  1009      History   Chief Complaint No chief complaint on file.   HPI Melody Stewart is a 35 y.o. female comes to the urgent care with 4 to 5-day history of sore throat, nasal congestion and greenish nasal discharge.  Patient's symptoms started insidiously and has been persistent.  She denies any fever or chills.  No shortness of breath, wheezing or noisy breathing.  Patient denies any sick contacts.  No nausea, vomiting or diarrhea.  Patient denies history of seasonal allergies.  Patient has been taking over-the-counter allergy medications.Marland Kitchen   HPI  Past Medical History:  Diagnosis Date   Anemia    Anxiety and depression 09/14/2015   Body aches    Dysuria    Family history of adverse reaction to anesthesia    mother-- hard to wake   Fibroadenoma of right breast    Frequency of urination    GERD (gastroesophageal reflux disease)    Hydronephrosis 03/24/2018   Bilateral, Right-mild to moderate, Left mild, noted on US renal   IBS (irritable bowel syndrome)    Mild asthma    Nephrolithiasis    Pre-eclampsia 03/2018   Severe   Urgency of urination    UTI (urinary tract infection)     Patient Active Problem List   Diagnosis Date Noted   History of abnormal cervical Pap smear 09/20/2020   Encounter for initial prescription of vaginal ring hormonal contraceptive 09/20/2020   Encounter for gynecological examination with Papanicolaou smear of cervix 09/20/2020   Chorioamnionitis 04/18/2018   Kidney stone complicating pregnancy, third trimester 03/15/2018   AKI (acute kidney injury) ([place]) 03/15/2018   Tinea corporis 02/19/2018   Persistent UTI during pregnancy 09/30/2017   Abnormal Pap smear of cervix 05/25/2016   Anxiety and depression 09/14/2015    Past Surgical History:  Procedure Laterality Date   CESAREAN SECTION N/A 04/17/2018   Procedure: CESAREAN SECTION;  Surgeon: Lavonia Drafts,  MD;  Location: [place];  Service: Obstetrics;  Laterality: N/A;   CYSTOSCOPY W/ URETERAL STENT PLACEMENT Bilateral 03/16/2018   Procedure: CYSTOSCOPY WITH RETROGRADE PYELOGRAM/URETERAL STENT PLACEMENT;  Surgeon: Festus Aloe, MD;  Location: [facility];  Service: Urology;  Laterality: Bilateral;   CYSTOSCOPY/URETEROSCOPY/HOLMIUM LASER/STENT PLACEMENT Bilateral 05/06/2018   Procedure: CYSTOSCOPY/URETEROSCOPY/HOLMIUM LASER/STENT EXCHANGE STONE BASKET RETRIVAL;  Surgeon: Festus Aloe, MD;  Location: [facility];  Service: Urology;  Laterality: Bilateral;   KNEE SURGERY Left age 59   TONSILLECTOMY     WISDOM TOOTH EXTRACTION      OB History     Gravida  1   Para  1   Term  1   Preterm  0   AB  0   Living  1      SAB  0   IAB  0   Ectopic  0   Multiple  0   Live Births               Home Medications    Prior to Admission medications   Medication Sig Start Date End Date Taking? Authorizing Provider  benzonatate (TESSALON) 100 MG capsule Take 1 capsule (100 mg total) by mouth every 8 (eight) hours. 04/18/21  Yes Cote Mayabb, Myrene Galas, MD  fluticasone (FLONASE) 50 MCG/ACT nasal spray Place 1 spray into both nostrils daily. 04/18/21  Yes Leshon Armistead, Myrene Galas, MD  guaiFENesin (MUCINEX) 600 MG 12 hr tablet Take 1 tablet (600  mg total) by mouth 2 (two) times daily. 04/18/21  Yes Janae Bonser, Myrene Galas, MD  montelukast (SINGULAIR) 10 MG tablet Take 1 tablet (10 mg total) by mouth at bedtime. 04/18/21  Yes Delight Bickle, Myrene Galas, MD  etonogestrel-ethinyl estradiol (NUVARING) 0.12-0.015 MG/24HR vaginal ring Insert vaginally and leave in place for 3 consecutive weeks, then remove for 1 week. 09/20/20   Estill Dooms, NP  FLOVENT HFA 44 MCG/ACT inhaler Inhale into the lungs. 04/28/20   [provider]  fluconazole (DIFLUCAN) 150 MG tablet Take 1 now and 1 in 3 days if needed 03/15/21   Estill Dooms, NP  VENTOLIN HFA 108 (90 Base) MCG/ACT inhaler Inhale 2  puffs into the lungs every 6 (six) hours as needed for wheezing.  07/27/15   [provider]    Family History Family History  Problem Relation Age of Onset   Hypertension Mother    Other Mother        mother had child hydrocephic-died   Hypertension Father    Heart disease Paternal Grandfather    Stroke Paternal Grandfather    Thyroid disease Maternal Grandmother    Heart disease Maternal Grandmother    Cancer Paternal Grandmother        breast   Thyroid disease Maternal Aunt    Cancer Paternal Aunt        breast    Social History Social History   Tobacco Use   Smoking status: Former    Years: 2.00    Types: Cigarettes    Quit date: 05/02/2014    Years since quitting: 6.9   Smokeless tobacco: Never  Vaping Use   Vaping Use: Never used  Substance Use Topics   Alcohol use: Not Currently   Drug use: Never     Allergies   Hydrocodone   Review of Systems Review of Systems  HENT:  Positive for congestion, postnasal drip, rhinorrhea and sore throat. Negative for sinus pressure.   Respiratory:  Positive for cough. Negative for chest tightness, shortness of breath and wheezing.   Cardiovascular: Negative.   Gastrointestinal: Negative.   Neurological: Negative.     Physical Exam Triage Vital Signs ED Triage Vitals  Enc Vitals Group     BP 04/18/21 1217 118/82     Pulse Rate 04/18/21 1217 85     Resp 04/18/21 1217 18     Temp 04/18/21 1217 98 F (36.7 C)     Temp src --      SpO2 04/18/21 1217 98 %     Weight --      Height --      Head Circumference --      Peak Flow --      Pain Score 04/18/21 1218 0     Pain Loc --      Pain Edu? --      Excl. in [place]? --    No data found.  Updated Vital Signs BP 118/82 (BP Location: Right Arm)   Pulse 85   Temp 98 F (36.7 C)   Resp 18   LMP 03/28/2021 (Approximate)   SpO2 98%   Visual Acuity Right Eye Distance:   Left Eye Distance:   Bilateral Distance:    Right Eye Near:   Left Eye Near:     Bilateral Near:     Physical Exam Vitals and nursing note reviewed.  Constitutional:      General: She is not in acute distress.    Appearance: She is not  ill-appearing.  Cardiovascular:     Rate and Rhythm: Normal rate and regular rhythm.     Pulses: Normal pulses.     Heart sounds: Normal heart sounds.  Pulmonary:     Effort: Pulmonary effort is normal.     Breath sounds: Normal breath sounds.  Neurological:     Mental Status: She is alert.     UC Treatments / Results  Labs (all labs ordered are listed, but only abnormal results are displayed) Labs Reviewed  NOVEL CORONAVIRUS, NAA    EKG   Radiology No results found.  Procedures Procedures (including critical care time)  Medications Ordered in UC Medications - No data to display  Initial Impression / Assessment and Plan / UC Course  I have reviewed the triage vital signs and the nursing notes.  Pertinent labs & imaging results that were available during my care of the patient were reviewed by me and considered in my medical decision making (see chart for details).     1.  Viral URI with cough: COVID-19 PCR test has been sent Warm salt water gargle Maintain adequate hydration Singulair 10 mg orally daily Fluticasone nasal spray Tessalon Perles as needed for cough Mucinex as needed for sputum Return to urgent care if symptoms worsen We will call you with recommendations if labs are abnormal. Final Clinical Impressions(s) / UC Diagnoses   Final diagnoses:  Exposure to COVID-19 virus  Viral URI with cough     Discharge Instructions      Warm salt water gargle Increase oral fluid intake We will call you with recommendations if labs abnormal Take medications as prescribed.   ED Prescriptions     Medication Sig Dispense Auth. Provider   montelukast (SINGULAIR) 10 MG tablet Take 1 tablet (10 mg total) by mouth at bedtime. 30 tablet Tameika Heckmann, Myrene Galas, MD   fluticasone (FLONASE) 50 MCG/ACT nasal  spray Place 1 spray into both nostrils daily. 16 g Jveon Pound, Myrene Galas, MD   benzonatate (TESSALON) 100 MG capsule Take 1 capsule (100 mg total) by mouth every 8 (eight) hours. 21 capsule Truong Delcastillo, Myrene Galas, MD   guaiFENesin (MUCINEX) 600 MG 12 hr tablet Take 1 tablet (600 mg total) by mouth 2 (two) times daily. -- Chase Picket, MD      PDMP not reviewed this encounter.   Chase Picket, MD 04/19/21 1524

## 2021-04-20 LAB — NOVEL CORONAVIRUS, NAA: SARS-CoV-2, NAA: NOT DETECTED

## 2021-04-20 LAB — SARS-COV-2, NAA 2 DAY TAT

## 2021-05-09 ENCOUNTER — Ambulatory Visit (INDEPENDENT_AMBULATORY_CARE_PROVIDER_SITE_OTHER): Payer: 59 | Admitting: Adult Health

## 2021-05-09 ENCOUNTER — Encounter: Payer: Self-pay | Admitting: Adult Health

## 2021-05-09 ENCOUNTER — Other Ambulatory Visit: Payer: Self-pay

## 2021-05-09 VITALS — BP 101/70 | HR 79 | Ht 66.0 in | Wt 256.0 lb

## 2021-05-09 DIAGNOSIS — R635 Abnormal weight gain: Secondary | ICD-10-CM | POA: Diagnosis not present

## 2021-05-09 DIAGNOSIS — Z131 Encounter for screening for diabetes mellitus: Secondary | ICD-10-CM

## 2021-05-09 DIAGNOSIS — R6889 Other general symptoms and signs: Secondary | ICD-10-CM | POA: Diagnosis not present

## 2021-05-09 DIAGNOSIS — N926 Irregular menstruation, unspecified: Secondary | ICD-10-CM | POA: Diagnosis not present

## 2021-05-09 DIAGNOSIS — Z3202 Encounter for pregnancy test, result negative: Secondary | ICD-10-CM | POA: Diagnosis not present

## 2021-05-09 LAB — POCT URINE PREGNANCY: Preg Test, Ur: NEGATIVE

## 2021-05-09 NOTE — Progress Notes (Signed)
  Subjective:     Patient ID: Melody Stewart, female   DOB: 1985/07/27, 35 y.o.   MRN: [id number]  HPI Melody Stewart is a 35 year old white female, single, G1P1 in complaining of missing a period, negative HPTs. She has not had nuva ring in since August. She feels hot, and has gained and will lose some and gain it back. PCP is Dr Quintin Alto.  Lab Results  Component Value Date   DIAGPAP  09/20/2020    - Negative for intraepithelial lesion or malignancy (NILM)   DIAGPAP - Benign reactive/reparative changes 09/20/2020   HPV NOT DETECTED 05/22/2017   [place] Negative 09/20/2020    Review of Systems +missed period +feels hot +gained weight Stress is normal   Reviewed past medical,surgical, social and family history. Reviewed medications and allergies.     Objective:   Physical Exam BP 101/70 (BP Location: Left Arm, Patient Position: Sitting, Cuff Size: Large)   Pulse 79   Ht 5\' 6"  (1.676 m)   Wt 256 lb (116.1 kg)   LMP 03/26/2021 (Approximate)   BMI 41.32 kg/m  UPT is negative. Skin warm and dry. Neck: mid line trachea, normal thyroid, good ROM, no lymphadenopathy noted. Lungs: clear to ausculation bilaterally. Cardiovascular: regular rate and rhythm.     Fall risk is low  [facility] - 05/09/21 0932       Pregnancy Intention Screening   Does the patient want to become pregnant in the next year? No    Does the patient's partner want to become pregnant in the next year? No    Would the patient like to discuss contraceptive options today? No      Contraception Wrap Up   Current Method No Method - Other Reason   had used nuva ring, but none since August   End Method Vaginal Ring    Contraception Counseling Provided No             Assessment:      1. Pregnancy examination or test, negative result  - POCT urine pregnancy  2. Missed period UPT is negative, can put ring in today  3. Sensation of feeling hot  Will check labs for feeling hot, weight gain and will screen for  diabetes  - Comprehensive metabolic panel  4. Weight gain  - Comprehensive metabolic panel - TSH - T4, free  5. Screening for diabetes mellitus  - Hemoglobin A1c     Plan:     Will talk when labs back

## 2021-05-10 LAB — COMPREHENSIVE METABOLIC PANEL
ALT: 25 IU/L (ref 0–32)
AST: 22 IU/L (ref 0–40)
Albumin/Globulin Ratio: 1.9 (ref 1.2–2.2)
Albumin: 4.2 g/dL (ref 3.8–4.8)
Alkaline Phosphatase: 80 IU/L (ref 44–121)
BUN/Creatinine Ratio: 12 (ref 9–23)
BUN: 10 mg/dL (ref 6–20)
Bilirubin Total: 0.6 mg/dL (ref 0.0–1.2)
CO2: 20 mmol/L (ref 20–29)
Calcium: 9.3 mg/dL (ref 8.7–10.2)
Chloride: 107 mmol/L — ABNORMAL HIGH (ref 96–106)
Creatinine, Ser: 0.83 mg/dL (ref 0.57–1.00)
Globulin, Total: 2.2 g/dL (ref 1.5–4.5)
Glucose: 87 mg/dL (ref 70–99)
Potassium: 4.5 mmol/L (ref 3.5–5.2)
Sodium: 139 mmol/L (ref 134–144)
Total Protein: 6.4 g/dL (ref 6.0–8.5)
eGFR: 94 mL/min/{1.73_m2} (ref >59)

## 2021-05-10 LAB — HEMOGLOBIN A1C
Est. average glucose Bld gHb Est-mCnc: 97 mg/dL
Hgb A1c MFr Bld: 5 % (ref 4.8–5.6)

## 2021-05-10 LAB — T4, FREE: Free T4: 1.11 ng/dL (ref 0.82–1.77)

## 2021-05-10 LAB — TSH: TSH: 1.09 u[IU]/mL (ref 0.450–4.500)

## 2021-08-17 ENCOUNTER — Ambulatory Visit (HOSPITAL_COMMUNITY): Payer: 59 | Admitting: Physical Therapy

## 2021-10-08 ENCOUNTER — Ambulatory Visit
Admission: EM | Admit: 2021-10-08 | Discharge: 2021-10-08 | Disposition: A | Discharge status: Home / Self Care | Payer: 59 | Attending: Urgent Care | Admitting: Urgent Care

## 2021-10-08 ENCOUNTER — Other Ambulatory Visit: Payer: Self-pay

## 2021-10-08 ENCOUNTER — Encounter: Payer: Self-pay | Admitting: Emergency Medicine

## 2021-10-08 DIAGNOSIS — T7849XA Other allergy, initial encounter: Secondary | ICD-10-CM

## 2021-10-08 DIAGNOSIS — R21 Rash and other nonspecific skin eruption: Secondary | ICD-10-CM

## 2021-10-08 DIAGNOSIS — L500 Allergic urticaria: Secondary | ICD-10-CM

## 2021-10-08 DIAGNOSIS — L299 Pruritus, unspecified: Secondary | ICD-10-CM | POA: Diagnosis not present

## 2021-10-08 MED ORDER — HYDROXYZINE HCL 25 MG PO TABS: 12.5000 mg | ORAL_TABLET | Freq: Three times a day (TID) | ORAL | 0 refills | Status: DC | PRN

## 2021-10-08 MED ORDER — PREDNISONE 20 MG PO TABS: ORAL_TABLET | ORAL | 0 refills | Status: DC

## 2021-10-08 NOTE — ED Triage Notes (Signed)
Had hair dyed on Friday.  Head started to itch that night.  Yesterday states back of neck moving up head was swollen.  Has been taking benadryl without relief. ?

## 2021-10-08 NOTE — ED Provider Notes (Signed)
?[facility] ? ? ?MRN: [id number] DOB: Aug 14, 1985 ? ?Subjective:  ? ?Melody Stewart is a 36 y.o. female presenting for 3 day history of acute persistent worsening rash over the scalp now having swelling.  Patient just had her hair dyed professionally.  Has not previously had any kind of reaction.  Denies facial or oral swelling, chest tightness, vomiting.  Has been using Benadryl consistently with minimal relief. ? ?No current facility-administered medications for this encounter. ? ?Current Outpatient Medications:  ?  VENTOLIN HFA 108 (90 Base) MCG/ACT inhaler, Inhale 2 puffs into the lungs every 6 (six) hours as needed for wheezing. , Disp: , Rfl:   ? ?Allergies  ?Allergen Reactions  ? Hydrocodone Other (See Comments)  ?  Make her sick and constipated.  ? ? ?Past Medical History:  ?Diagnosis Date  ? Anemia   ? Anxiety and depression 09/14/2015  ? Body aches   ? Dysuria   ? Family history of adverse reaction to anesthesia   ? mother-- hard to wake  ? Fibroadenoma of right breast   ? Frequency of urination   ? GERD (gastroesophageal reflux disease)   ? Hydronephrosis 03/24/2018  ? Bilateral, Right-mild to moderate, Left mild, noted on US renal  ? IBS (irritable bowel syndrome)   ? Mild asthma   ? Nephrolithiasis   ? Pre-eclampsia 03/2018  ? Severe  ? Urgency of urination   ? UTI (urinary tract infection)   ?  ? ?Past Surgical History:  ?Procedure Laterality Date  ? CESAREAN SECTION N/A 04/17/2018  ? Procedure: CESAREAN SECTION;  Surgeon: Lavonia Drafts, MD;  Location: [facility];  Service: Obstetrics;  Laterality: N/A;  ? CYSTOSCOPY W/ URETERAL STENT PLACEMENT Bilateral 03/16/2018  ? Procedure: CYSTOSCOPY WITH RETROGRADE PYELOGRAM/URETERAL STENT PLACEMENT;  Surgeon: Festus Aloe, MD;  Location: [facility];  Service: Urology;  Laterality: Bilateral;  ? CYSTOSCOPY/URETEROSCOPY/HOLMIUM LASER/STENT PLACEMENT Bilateral 05/06/2018  ? Procedure: CYSTOSCOPY/URETEROSCOPY/HOLMIUM LASER/STENT  EXCHANGE STONE BASKET RETRIVAL;  Surgeon: Festus Aloe, MD;  Location: [facility];  Service: Urology;  Laterality: Bilateral;  ? KNEE SURGERY Left age 52  ? TONSILLECTOMY    ? WISDOM TOOTH EXTRACTION    ? ? ?Family History  ?Problem Relation Age of Onset  ? Hypertension Mother   ? Other Mother   ?     mother had child hydrocephic-died  ? Hypertension Father   ? Heart disease Paternal Grandfather   ? Stroke Paternal Grandfather   ? Thyroid disease Maternal Grandmother   ? Heart disease Maternal Grandmother   ? Cancer Paternal Grandmother   ?     breast  ? Thyroid disease Maternal Aunt   ? Cancer Paternal Aunt   ?     breast  ? ? ?Social History  ? ?Tobacco Use  ? Smoking status: Former  ?  Years: 2.00  ?  Types: Cigarettes  ?  Quit date: 05/02/2014  ?  Years since quitting: 7.4  ? Smokeless tobacco: Never  ?Vaping Use  ? Vaping Use: Never used  ?Substance Use Topics  ? Alcohol use: Not Currently  ? Drug use: Never  ? ? ?ROS ? ? ?Objective:  ? ?Vitals: ?BP 119/77 (BP Location: Right Arm)   Pulse 80   Temp 98.3 ?F (36.8 ?C) (Oral)   Resp 16   LMP 10/02/2021 (Exact Date)   SpO2 96%  ? ?Physical Exam ?Constitutional:   ?   General: She is not in acute distress. ?   Appearance: Normal appearance.  She is well-developed. She is not ill-appearing, toxic-appearing or diaphoretic.  ?HENT:  ?   Head: Normocephalic and atraumatic.  ?   Nose: Nose normal.  ?   Mouth/Throat:  ?   Mouth: Mucous membranes are moist.  ?Eyes:  ?   General: No scleral icterus.    ?   Right eye: No discharge.     ?   Left eye: No discharge.  ?   Extraocular Movements: Extraocular movements intact.  ?Cardiovascular:  ?   Rate and Rhythm: Normal rate.  ?Pulmonary:  ?   Effort: Pulmonary effort is normal.  ?Skin: ?   General: Skin is warm and dry.  ?   Findings: Rash (macular urticarial patches diffusely scattered over her scalp extending into the neck) present.  ?Neurological:  ?   General: No focal deficit present.  ?   Mental  Status: She is alert and oriented to person, place, and time.  ?Psychiatric:     ?   Mood and Affect: Mood normal.     ?   Behavior: Behavior normal.     ?   Thought Content: Thought content normal.     ?   Judgment: Judgment normal.  ? ? ?Assessment and Plan :  ? ?PDMP not reviewed this encounter. ? ?1. Allergic reaction to hair dye   ?2. Allergic urticaria   ?3. Rash and nonspecific skin eruption   ?4. Itching   ? ?Recommended an oral prednisone course, hydroxyzine.  Advised that she return to the hairstylist that died her hair and request removal of the dye.  No signs of an anaphylactic reaction. Counseled patient on potential for adverse effects with medications prescribed/recommended today, ER and return-to-clinic precautions discussed, patient verbalized understanding. ? ?  ?Jaynee Eagles, PA-C ?10/08/21 0820 ? ?

## 2021-11-14 ENCOUNTER — Other Ambulatory Visit: Payer: Self-pay | Admitting: Adult Health

## 2021-11-14 MED ORDER — NUVARING 0.12-0.015 MG/24HR VA RING: 1.0000 | VAGINAL_RING | VAGINAL | 2 refills | Status: DC

## 2021-11-14 NOTE — Telephone Encounter (Signed)
Patient called to set up an appointment for her pap/physical which is on 5/17 @ 3:30. I seen you was the last provider to refill her prescription. She will need a refill to get her to her appointment in May. Please advise.  ?

## 2021-11-14 NOTE — Addendum Note (Signed)
Addended by: Dorita Sciara, Izadora Roehr A on: 11/14/2021 12:11 PM ? ? Modules accepted: Orders ? ?

## 2021-12-06 ENCOUNTER — Ambulatory Visit (INDEPENDENT_AMBULATORY_CARE_PROVIDER_SITE_OTHER): Payer: 59 | Admitting: Obstetrics & Gynecology

## 2021-12-06 ENCOUNTER — Other Ambulatory Visit (HOSPITAL_COMMUNITY)
Admission: RE | Admit: 2021-12-06 | Discharge: 2021-12-06 | Disposition: A | Discharge status: Home / Self Care | Payer: 59 | Source: Ambulatory Visit | Attending: Obstetrics & Gynecology | Admitting: Obstetrics & Gynecology

## 2021-12-06 ENCOUNTER — Encounter: Payer: Self-pay | Admitting: Obstetrics & Gynecology

## 2021-12-06 VITALS — BP 114/74 | HR 102 | Ht 66.0 in | Wt 251.8 lb

## 2021-12-06 DIAGNOSIS — Z113 Encounter for screening for infections with a predominantly sexual mode of transmission: Secondary | ICD-10-CM | POA: Insufficient documentation

## 2021-12-06 DIAGNOSIS — R635 Abnormal weight gain: Secondary | ICD-10-CM

## 2021-12-06 DIAGNOSIS — Z131 Encounter for screening for diabetes mellitus: Secondary | ICD-10-CM

## 2021-12-06 DIAGNOSIS — Z0001 Encounter for general adult medical examination with abnormal findings: Secondary | ICD-10-CM

## 2021-12-06 DIAGNOSIS — Z6841 Body Mass Index (BMI) 40.0 and over, adult: Secondary | ICD-10-CM

## 2021-12-06 DIAGNOSIS — Z3009 Encounter for other general counseling and advice on contraception: Secondary | ICD-10-CM | POA: Diagnosis not present

## 2021-12-06 DIAGNOSIS — Z8349 Family history of other endocrine, nutritional and metabolic diseases: Secondary | ICD-10-CM

## 2021-12-06 DIAGNOSIS — Z3044 Encounter for surveillance of vaginal ring hormonal contraceptive device: Secondary | ICD-10-CM

## 2021-12-06 DIAGNOSIS — Z13220 Encounter for screening for lipoid disorders: Secondary | ICD-10-CM

## 2021-12-06 MED ORDER — NUVARING 0.12-0.015 MG/24HR VA RING
1.0000 | VAGINAL_RING | VAGINAL | 4 refills | Status: DC
Start: 2021-12-06 — End: 2023-02-19

## 2021-12-06 NOTE — Progress Notes (Signed)
? ?WELL-WOMAN EXAMINATION ?Patient name: Melody Stewart MRN [id number]  Date of birth: [birth year] ?Chief Complaint:   ?Gynecologic Exam (Family Planning; wants fasting labs) ? ?History of Present Illness:   ?Melody Stewart is a 36 y.o. G11P1001 female being seen today for a routine well-woman exam.  ? ?Today she notes the following: ? ?-Dysmenorrhea- ongoing issue, but tolerable. Improvement with nuvaring which she wishes to continue ? ?-Weight management- this has been an going issue.  Notes considerable fluctuation, she may lose 50lbs then gain it back.  Recently despite avoiding sugars and trying to watch nutrition she has not had any weight loss.  Reports strong family history of thyroid disease and concern this is playing a role ? ? ?Patient's last menstrual period was 11/06/2021 (approximate). ?Denies issues with her menses ?The current method of family planning is NuvaRing vaginal inserts.  ? ? ?Last pap 2022.  ?Last mammogram: n/a. ?Last colonoscopy: n/a ? ? ?  12/06/2021  ?  3:45 PM 09/20/2020  ? 11:38 AM 06/15/2019  ?  8:48 AM 07/24/2018  ? 10:56 AM 06/10/2018  ?  2:30 PM  ?Depression screen PHQ 2/9  ?Decreased Interest '2 1 1 '$ 0 0  ?Down, Depressed, Hopeless '1 1 2 1 1  '$ ?PHQ - 2 Score '3 2 3 1 1  '$ ?Altered sleeping '3 1 3  '$ 0  ?Tired, decreased energy '3 3 3  '$ 0  ?Change in appetite 3 0 2  0  ?Feeling bad or failure about yourself  2 0 2  0  ?Trouble concentrating 2 0 2  1  ?Moving slowly or fidgety/restless 0 0 0  2  ?Suicidal thoughts 0 0 0  0  ?PHQ-9 Score '16 6 15  4  '$ ?Difficult doing work/chores   Very difficult  Somewhat difficult  ? ? ? ? ?Review of Systems:   ?Pertinent items are noted in HPI ?Denies any headaches, blurred vision, fatigue, shortness of breath, chest pain, abdominal pain, bowel movements, urination, or intercourse unless otherwise stated above. ? ?Pertinent History Reviewed:  ?Reviewed past medical,surgical, social and family history.  ?Reviewed problem list, medications and allergies. ?Physical  Assessment:  ? ?Vitals:  ? 12/06/21 1541  ?BP: 114/74  ?Pulse: (!) 102  ?Weight: 251 lb 12.8 oz (114.2 kg)  ?Height: '5\' 6"'$  (1.676 m)  ?Body mass index is 40.64 kg/m?. ?  ?     Physical Examination:  ? General appearance - well appearing, and in no distress ? Mental status - alert, oriented to person, place, and time ? Psych:  She has a normal mood and affect ? Skin - warm and dry, normal color, no suspicious lesions noted ? Chest - effort normal, all lung fields clear to auscultation bilaterally ? Heart - normal rate and regular rhythm ? Neck:  midline trachea, no thyromegaly or nodules ? Breasts - breasts appear normal, no suspicious masses, no skin or nipple changes or  axillary nodes ? Abdomen - soft, nontender, nondistended, no masses or organomegaly ? Pelvic - VULVA: normal appearing vulva with no masses, tenderness or lesions  VAGINA: normal appearing vagina with normal color and discharge, no lesions  CERVIX: normal appearing cervix without discharge or lesions, no CMT ? UTERUS: uterus is felt to be normal size, shape, consistency and nontender  ? ADNEXA: No adnexal masses or tenderness noted. ? Extremities:  No swelling or varicosities noted ? ?Chaperone: Levy Pupa   ? ? ?Assessment & Plan:  ?1) Well-Woman Exam ?-pap up to date, reviewed  screening guidelines ? ?2) Contraceptive management ?-continue with ring ?-discussed OTC remedies like vitex to help with dysmenorrhea ? ?3) Family h/o thyroid disease ?-screening lab work in am ? ?4) Weight management ?-long discussion regarding alternative options including WHOLE 30 ?-referral to [facility]  ? ?Will also plan for lipid and DM screening ? ?Orders Placed This Encounter  ?Procedures  ? RPR  ? HIV Antibody (routine testing w rflx)  ? Thyroid Panel With TSH  ? Lipid Profile  ? HgB A1c  ? Amb Ref to Medical Weight Management  ? ? ?Meds:  ?Meds ordered this encounter  ?Medications  ? NUVARING 0.12-0.015 MG/24HR vaginal ring  ?  Sig: Place 1 each vaginally every  28 (twenty-eight) days.  ?  Dispense:  3 each  ?  Refill:  4  ? ? ?Follow-up: Return in about 1 year (around 12/07/2022) for Annual. ? ? ?Janyth Pupa, DO ?Attending [place], Faculty Practice ?[facility] ? ? ?

## 2021-12-08 ENCOUNTER — Other Ambulatory Visit: Payer: Self-pay | Admitting: Family Medicine

## 2021-12-08 ENCOUNTER — Other Ambulatory Visit (HOSPITAL_COMMUNITY): Payer: Self-pay | Admitting: Family Medicine

## 2021-12-08 DIAGNOSIS — M5410 Radiculopathy, site unspecified: Secondary | ICD-10-CM

## 2021-12-08 DIAGNOSIS — M545 Low back pain, unspecified: Secondary | ICD-10-CM

## 2021-12-08 LAB — RPR: RPR Ser Ql: NONREACTIVE

## 2021-12-08 LAB — LIPID PANEL
Chol/HDL Ratio: 4.1 ratio (ref 0.0–4.4)
Cholesterol, Total: 174 mg/dL (ref 100–199)
HDL: 42 mg/dL (ref >39)
LDL Chol Calc (NIH): 108 mg/dL — ABNORMAL HIGH (ref 0–99)
Triglycerides: 133 mg/dL (ref 0–149)
VLDL Cholesterol Cal: 24 mg/dL (ref 5–40)

## 2021-12-08 LAB — HEMOGLOBIN A1C
Est. average glucose Bld gHb Est-mCnc: 97 mg/dL
Hgb A1c MFr Bld: 5 % (ref 4.8–5.6)

## 2021-12-08 LAB — THYROID PANEL WITH TSH
Free Thyroxine Index: 2.8 (ref 1.2–4.9)
T3 Uptake Ratio: 21 % — ABNORMAL LOW (ref 24–39)
T4, Total: 13.3 ug/dL — ABNORMAL HIGH (ref 4.5–12.0)
TSH: 1.27 u[IU]/mL (ref 0.450–4.500)

## 2021-12-08 LAB — CERVICOVAGINAL ANCILLARY ONLY
Chlamydia: NEGATIVE
Comment: NEGATIVE
Comment: NORMAL
Neisseria Gonorrhea: NEGATIVE

## 2021-12-11 ENCOUNTER — Telehealth: Payer: Self-pay

## 2021-12-11 NOTE — Telephone Encounter (Signed)
Pt called and wanted someone to call her about her test results.

## 2021-12-11 NOTE — Telephone Encounter (Signed)
Returned pt's call, two identifiers used. Pt wanted some advice on her thyroid function tests and how that relates to her hair loss, hot flashes, etc. Informed pt her concerns would be forwarded to Dr Nelda Marseille to f/u with her. Pt confirmed understanding.

## 2021-12-12 NOTE — Telephone Encounter (Signed)
Mychart msg sent to pt relating Dr Annie Main advice.

## 2021-12-27 ENCOUNTER — Ambulatory Visit (HOSPITAL_COMMUNITY)
Admission: RE | Admit: 2021-12-27 | Discharge: 2021-12-27 | Disposition: A | Discharge status: Home / Self Care | Payer: 59 | Source: Ambulatory Visit | Attending: Family Medicine | Admitting: Family Medicine

## 2021-12-27 DIAGNOSIS — M5410 Radiculopathy, site unspecified: Secondary | ICD-10-CM | POA: Diagnosis present

## 2021-12-27 DIAGNOSIS — M5127 Other intervertebral disc displacement, lumbosacral region: Secondary | ICD-10-CM | POA: Diagnosis not present

## 2021-12-27 DIAGNOSIS — M545 Low back pain, unspecified: Secondary | ICD-10-CM | POA: Diagnosis present

## 2021-12-27 DIAGNOSIS — M5126 Other intervertebral disc displacement, lumbar region: Secondary | ICD-10-CM | POA: Diagnosis not present

## 2022-02-22 ENCOUNTER — Ambulatory Visit: Payer: 59 | Admitting: Orthopaedic Surgery

## 2022-02-22 DIAGNOSIS — M7062 Trochanteric bursitis, left hip: Secondary | ICD-10-CM | POA: Diagnosis not present

## 2022-02-22 DIAGNOSIS — M5126 Other intervertebral disc displacement, lumbar region: Secondary | ICD-10-CM | POA: Diagnosis not present

## 2022-02-22 MED ORDER — METHYLPREDNISOLONE ACETATE 40 MG/ML IJ SUSP
40.0000 mg | INTRAMUSCULAR | Status: AC | PRN
  Administered 2022-02-22: 40 mg via INTRA_ARTICULAR

## 2022-02-22 MED ORDER — LIDOCAINE HCL 1 % IJ SOLN
1.0000 mL | INTRAMUSCULAR | Status: AC | PRN
  Administered 2022-02-22: 1 mL

## 2022-02-22 MED ORDER — BUPIVACAINE HCL 0.25 % IJ SOLN
2.0000 mL | INTRAMUSCULAR | Status: AC | PRN
  Administered 2022-02-22: 2 mL via INTRA_ARTICULAR

## 2022-02-22 NOTE — Progress Notes (Addendum)
Office Visit Note   Patient: Melody Stewart           Date of Birth: 1985/09/28           MRN: [id number] Visit Date: 02/22/2022              Requested by: Sasser, Silvestre Moment, MD [place],  [place] [address] PCP: Manon Hilding, MD   Assessment & Plan: Visit Diagnoses:  1. Protrusion of lumbar intervertebral disc   2. Trochanteric bursitis, left hip     Plan: Trochanteric  injection performed MRI scan reviewed we discussed pathophysiology.  She has some secondary trochanteric bursitis.  She will continue to work on a walking program gradually losing weight since after her daughter was born she gradually is put on some weight now BMI is at 40.  We discussed possible single epidural injection.  We discussed activities to avoid.  Recheck 5 weeks.  Follow-Up Instructions: Return in about 5 weeks (around 03/29/2022).   Orders:  Orders Placed This Encounter  Procedures   Large Joint Inj   No orders of the defined types were placed in this encounter.     Procedures: Large Joint Inj: L greater trochanter on 02/22/2022 10:36 AM Details: 22 G 1.5 in needle, lateral approach Medications: 1 mL lidocaine 1 %; 2 mL bupivacaine 0.25 %; 40 mg methylPREDNISolone acetate 40 MG/ML      Clinical Data: No additional findings.   Subjective: Chief Complaint  Patient presents with   Lower Back - Pain    HPI 36 year old female here with notes from [place] with a problem of the back pain off-and-on for several years much worse in the last year.  Pain is intermittent episodic variable intensity sometimes severe enough she has trouble walking she has sharp catching pain at times with motion.  She is taken Aleve, muscle relaxants, heating pad.  She has had some numbness and tingling in her feet and to the left side pain radiating into the left buttocks none currently on the right.  No associated bowel or bladder symptoms.  Patient's had an MRI scan which is available for review.   This shows small disc protrusion without compression at T12-L1 and L1-L2 not symptomatic.  She does have small shallow disc protrusion at L5-S1 which is consistent with her symptoms.  Although the disc are normal with good hydration.  She denies fever or chills.  Patient is a 39-year-old daughter she has increased pain when she turns twists or picks up her daughter.  Review of Systems all the systems are noncontributory to HPI.   Objective: Vital Signs: Ht '5\' 6"'$  (1.676 m)   Wt 251 lb (113.9 kg)   BMI 40.51 kg/m   Physical Exam Constitutional:      Appearance: She is well-developed.  HENT:     Head: Normocephalic.     Right Ear: External ear normal.     Left Ear: External ear normal. There is no impacted cerumen.  Eyes:     Pupils: Pupils are equal, round, and reactive to light.  Neck:     Thyroid: No thyromegaly.     Trachea: No tracheal deviation.  Cardiovascular:     Rate and Rhythm: Normal rate.  Pulmonary:     Effort: Pulmonary effort is normal.  Abdominal:     Palpations: Abdomen is soft.  Musculoskeletal:     Cervical back: No rigidity.  Skin:    General: Skin is warm and dry.  Neurological:     Mental Status: She is alert and oriented to person, place, and time.  Psychiatric:        Behavior: Behavior normal.     Ortho Exam patient has tenderness over the left SI joint left sciatic notch and directly over the greater trochanter on the left none on the right.  Some discomfort straight leg raising at 90 degrees on the left.  She is able to heel and toe walk nicely motor weakness.  Specialty Comments:  No specialty comments available.  Imaging: CLINICAL DATA:  Low back pain and right leg numbness for 3 years   EXAM: MRI LUMBAR SPINE WITHOUT CONTRAST   TECHNIQUE: Multiplanar, multisequence MR imaging of the lumbar spine was performed. No intravenous contrast was administered.   COMPARISON:  None Available.   FINDINGS: Segmentation:  Standard.   Alignment:   2 mm retrolisthesis of L5 on S1.   Vertebrae: No acute fracture, evidence of discitis, or aggressive bone lesion.   Conus medullaris and cauda equina: Conus extends to the L1 level. Conus and cauda equina appear normal.   Paraspinal and other soft tissues: No acute paraspinal abnormality.   Disc levels:   Disc spaces: Mild degenerative disease with disc height loss T12-L1 and L5-S1.   T12-L1: Small central disc protrusion. No foraminal or central canal stenosis.   L1-L2: Tiny central disc protrusion. No foraminal or central canal stenosis.   L2-L3: No significant disc bulge. No neural foraminal stenosis. No central canal stenosis.   L3-L4: No significant disc bulge. No neural foraminal stenosis. No central canal stenosis.   L4-L5: No significant disc bulge. No neural foraminal stenosis. No central canal stenosis. Mild bilateral facet arthropathy.   L5-S1: Small shallow central disc protrusion. No foraminal or central canal stenosis.   IMPRESSION: 1. At L5-S1 there is a small shallow central disc protrusion without nerve root impingement. 2. At T12-L1 and L1-2 there are small central disc protrusions without nerve root impingement. 3. No acute osseous injury of the lumbar spine.     Electronically Signed   By: Kathreen Devoid M.D.   On: 12/28/2021 08:41     PMFS History: Patient Active Problem List   Diagnosis Date Noted   Protrusion of lumbar intervertebral disc 02/22/2022   Trochanteric bursitis, left hip 02/22/2022   Missed period 05/09/2021   Pregnancy examination or test, negative result 05/09/2021   Sensation of feeling hot 05/09/2021   Weight gain 05/09/2021   Screening for diabetes mellitus 05/09/2021   History of abnormal cervical Pap smear 09/20/2020   Encounter for initial prescription of vaginal ring hormonal contraceptive 09/20/2020   Encounter for gynecological examination with Papanicolaou smear of cervix 09/20/2020   Chorioamnionitis 04/18/2018    Kidney stone complicating pregnancy, third trimester 03/15/2018   AKI (acute kidney injury) ([place]) 03/15/2018   Tinea corporis 02/19/2018   Persistent UTI during pregnancy 09/30/2017   Abnormal Pap smear of cervix 05/25/2016   Anxiety and depression 09/14/2015   Past Medical History:  Diagnosis Date   Anemia    Anxiety and depression 09/14/2015   Body aches    Dysuria    Family history of adverse reaction to anesthesia    mother-- hard to wake   Fibroadenoma of right breast    Frequency of urination    GERD (gastroesophageal reflux disease)    Hydronephrosis 03/24/2018   Bilateral, Right-mild to moderate, Left mild, noted on US renal   IBS (irritable bowel syndrome)    Mild asthma  Nephrolithiasis    Pre-eclampsia 03/2018   Severe   Urgency of urination    UTI (urinary tract infection)     Family History  Problem Relation Age of Onset   Hypertension Mother    Other Mother        mother had child hydrocephic-died   Hypertension Father    Heart disease Paternal Grandfather    Stroke Paternal Grandfather    Thyroid disease Maternal Grandmother    Heart disease Maternal Grandmother    Cancer Paternal Grandmother        breast   Thyroid disease Maternal Aunt    Cancer Paternal Aunt        breast    Past Surgical History:  Procedure Laterality Date   CESAREAN SECTION N/A 04/17/2018   Procedure: CESAREAN SECTION;  Surgeon: Lavonia Drafts, MD;  Location: [facility];  Service: Obstetrics;  Laterality: N/A;   CYSTOSCOPY W/ URETERAL STENT PLACEMENT Bilateral 03/16/2018   Procedure: CYSTOSCOPY WITH RETROGRADE PYELOGRAM/URETERAL STENT PLACEMENT;  Surgeon: Festus Aloe, MD;  Location: [facility];  Service: Urology;  Laterality: Bilateral;   CYSTOSCOPY/URETEROSCOPY/HOLMIUM LASER/STENT PLACEMENT Bilateral 05/06/2018   Procedure: CYSTOSCOPY/URETEROSCOPY/HOLMIUM LASER/STENT EXCHANGE STONE BASKET RETRIVAL;  Surgeon: Festus Aloe, MD;  Location: [facility];  Service: Urology;  Laterality: Bilateral;   KNEE SURGERY Left age 42   TONSILLECTOMY     WISDOM TOOTH EXTRACTION     Social History   Occupational History   Not on file  Tobacco Use   Smoking status: Former    Years: 2.00    Types: Cigarettes    Quit date: 05/02/2014    Years since quitting: 7.8   Smokeless tobacco: Never  Vaping Use   Vaping Use: Never used  Substance and Sexual Activity   Alcohol use: Not Currently   Drug use: Never   Sexual activity: Yes    Birth control/protection: Inserts

## 2022-03-05 ENCOUNTER — Telehealth: Payer: Self-pay | Admitting: Orthopaedic Surgery

## 2022-03-05 DIAGNOSIS — M5126 Other intervertebral disc displacement, lumbar region: Secondary | ICD-10-CM

## 2022-03-05 NOTE — Telephone Encounter (Signed)
Please advise 

## 2022-03-05 NOTE — Telephone Encounter (Signed)
Patient called advised the injection worked for about a week. Patient asked if she can be referred for an injection in her back? The number to contact patient is [phone]

## 2022-03-06 NOTE — Telephone Encounter (Signed)
I left voicemail for patient advising referral entered. Explained that we would obtain insurance approval and then call to have her scheduled.

## 2022-03-16 ENCOUNTER — Ambulatory Visit: Payer: 59 | Admitting: Physical Medicine and Rehabilitation

## 2022-03-16 ENCOUNTER — Encounter: Payer: Self-pay | Admitting: Physical Medicine and Rehabilitation

## 2022-03-16 ENCOUNTER — Ambulatory Visit: Payer: Self-pay

## 2022-03-16 VITALS — BP 105/60 | HR 85

## 2022-03-16 DIAGNOSIS — M5416 Radiculopathy, lumbar region: Secondary | ICD-10-CM

## 2022-03-16 MED ORDER — METHYLPREDNISOLONE ACETATE 80 MG/ML IJ SUSP
40.0000 mg | Freq: Once | INTRAMUSCULAR | Status: AC
  Administered 2022-03-16: 40 mg

## 2022-03-16 NOTE — Patient Instructions (Signed)

## 2022-03-16 NOTE — Progress Notes (Unsigned)
Pt state lower back pain that travels to her buttocks. Pt state walking and laying down makes the pain worse. Pt state she takes over the counter pain meds and heat to help ease her pain.  Numeric Pain Rating Scale and Functional Assessment Average Pain 5   In the last MONTH (on 0-10 scale) has pain interfered with the following?  1. General activity like being  able to carry out your everyday physical activities such as walking, climbing stairs, carrying groceries, or moving a chair?  Rating(10)   +Driver, -BT, -Dye Allergies.

## 2022-03-20 NOTE — Progress Notes (Signed)
Melody Stewart - 36 y.o. female MRN [id number]  Date of birth: 05-Nov-1985  Office Visit Note: Visit Date: 03/16/2022 PCP: Manon Hilding, MD Referred by: Sasser, Silvestre Moment, MD  Subjective: Chief Complaint  Patient presents with   Lower Back - Pain   HPI:  Melody Stewart is a 36 y.o. female who comes in today at the request of Dr. Rodell Perna for planned Left L5-S1 Lumbar Interlaminar epidural steroid injection with fluoroscopic guidance.  The patient has failed conservative care including home exercise, medications, time and activity modification.  This injection will be diagnostic and hopefully therapeutic.  Please see requesting physician notes for further details and justification.   ROS Otherwise per HPI.  Assessment & Plan: Visit Diagnoses:    ICD-10-CM   1. Lumbar radiculopathy  M54.16 XR C-ARM NO REPORT    Epidural Steroid injection    methylPREDNISolone acetate (DEPO-MEDROL) injection 40 mg      Plan: No additional findings.   Meds & Orders:  Meds ordered this encounter  Medications   methylPREDNISolone acetate (DEPO-MEDROL) injection 40 mg    Orders Placed This Encounter  Procedures   XR C-ARM NO REPORT   Epidural Steroid injection    Follow-up: Return for visit to requesting provider as needed.   Procedures: No procedures performed  Lumbar Epidural Steroid Injection - Interlaminar Approach with Fluoroscopic Guidance  Patient: [name]      Date of Birth: [birth year] MRN: [id number] PCP: Manon Hilding, MD      Visit Date: 03/16/2022   Universal Protocol:     Consent Given By: the patient  Position: PRONE  Additional Comments: Vital signs were monitored before and after the procedure. Patient was prepped and draped in the usual sterile fashion. The correct patient, procedure, and site was verified.   Injection Procedure Details:   Procedure diagnoses: Lumbar radiculopathy [M54.16]   Meds Administered:  Meds ordered this encounter   Medications   methylPREDNISolone acetate (DEPO-MEDROL) injection 40 mg     Laterality: Left  Location/Site:  L5-S1  Needle: 4.5 in., 20 ga. Tuohy  Needle Placement: Paramedian epidural  Findings:   -Comments: Excellent flow of contrast into the epidural space.  Procedure Details: Using a paramedian approach from the side mentioned above, the region overlying the inferior lamina was localized under fluoroscopic visualization and the soft tissues overlying this structure were infiltrated with 4 ml. of 1% Lidocaine without Epinephrine. The Tuohy needle was inserted into the epidural space using a paramedian approach.   The epidural space was localized using loss of resistance along with counter oblique bi-planar fluoroscopic views.  After negative aspirate for air, blood, and CSF, a 2 ml. volume of Isovue-250 was injected into the epidural space and the flow of contrast was observed. Radiographs were obtained for documentation purposes.    The injectate was administered into the level noted above.   Additional Comments:  The patient tolerated the procedure well Dressing: 2 x 2 sterile gauze and Band-Aid    Post-procedure details: Patient was observed during the procedure. Post-procedure instructions were reviewed.  Patient left the clinic in stable condition.   Clinical History: FINDINGS: Segmentation:  Standard.   Alignment:  2 mm retrolisthesis of L5 on S1.   Vertebrae: No acute fracture, evidence of discitis, or aggressive bone lesion.   Conus medullaris and cauda equina: Conus extends to the L1 level. Conus and cauda equina appear normal.   Paraspinal and other soft tissues: No acute paraspinal abnormality.  Disc levels:   Disc spaces: Mild degenerative disease with disc height loss T12-L1 and L5-S1.   T12-L1: Small central disc protrusion. No foraminal or central canal stenosis.   L1-L2: Tiny central disc protrusion. No foraminal or central  canal stenosis.   L2-L3: No significant disc bulge. No neural foraminal stenosis. No central canal stenosis.   L3-L4: No significant disc bulge. No neural foraminal stenosis. No central canal stenosis.   L4-L5: No significant disc bulge. No neural foraminal stenosis. No central canal stenosis. Mild bilateral facet arthropathy.   L5-S1: Small shallow central disc protrusion. No foraminal or central canal stenosis.   IMPRESSION: 1. At L5-S1 there is a small shallow central disc protrusion without nerve root impingement. 2. At T12-L1 and L1-2 there are small central disc protrusions without nerve root impingement. 3. No acute osseous injury of the lumbar spine.     Electronically Signed   By: Kathreen Devoid M.D.   On: 12/28/2021 08:41     Objective:  VS:  HT:    WT:   BMI:     BP:105/60  HR:85bpm  TEMP: ( )  RESP:  Physical Exam Vitals and nursing note reviewed.  Constitutional:      General: She is not in acute distress.    Appearance: Normal appearance. She is obese. She is not ill-appearing.  HENT:     Head: Normocephalic and atraumatic.     Right Ear: External ear normal.     Left Ear: External ear normal.  Eyes:     Extraocular Movements: Extraocular movements intact.  Cardiovascular:     Rate and Rhythm: Normal rate.     Pulses: Normal pulses.  Pulmonary:     Effort: Pulmonary effort is normal. No respiratory distress.  Abdominal:     General: There is no distension.     Palpations: Abdomen is soft.  Musculoskeletal:        General: Tenderness present.     Cervical back: Neck supple.     Right lower leg: No edema.     Left lower leg: No edema.     Comments: Patient has good distal strength with no pain over the greater trochanters.  No clonus or focal weakness.  Skin:    Findings: No erythema, lesion or rash.  Neurological:     General: No focal deficit present.     Mental Status: She is alert and oriented to person, place, and time.     Sensory: No  sensory deficit.     Motor: No weakness or abnormal muscle tone.     Coordination: Coordination normal.  Psychiatric:        Mood and Affect: Mood normal.        Behavior: Behavior normal.      Imaging: No results found.

## 2022-03-20 NOTE — Procedures (Signed)
Lumbar Epidural Steroid Injection - Interlaminar Approach with Fluoroscopic Guidance  Patient: Melody Stewart      Date of Birth: [birth year] MRN: [id number] PCP: Manon Hilding, MD      Visit Date: 03/16/2022   Universal Protocol:     Consent Given By: the patient  Position: PRONE  Additional Comments: Vital signs were monitored before and after the procedure. Patient was prepped and draped in the usual sterile fashion. The correct patient, procedure, and site was verified.   Injection Procedure Details:   Procedure diagnoses: Lumbar radiculopathy [M54.16]   Meds Administered:  Meds ordered this encounter  Medications   methylPREDNISolone acetate (DEPO-MEDROL) injection 40 mg     Laterality: Left  Location/Site:  L5-S1  Needle: 4.5 in., 20 ga. Tuohy  Needle Placement: Paramedian epidural  Findings:   -Comments: Excellent flow of contrast into the epidural space.  Procedure Details: Using a paramedian approach from the side mentioned above, the region overlying the inferior lamina was localized under fluoroscopic visualization and the soft tissues overlying this structure were infiltrated with 4 ml. of 1% Lidocaine without Epinephrine. The Tuohy needle was inserted into the epidural space using a paramedian approach.   The epidural space was localized using loss of resistance along with counter oblique bi-planar fluoroscopic views.  After negative aspirate for air, blood, and CSF, a 2 ml. volume of Isovue-250 was injected into the epidural space and the flow of contrast was observed. Radiographs were obtained for documentation purposes.    The injectate was administered into the level noted above.   Additional Comments:  The patient tolerated the procedure well Dressing: 2 x 2 sterile gauze and Band-Aid    Post-procedure details: Patient was observed during the procedure. Post-procedure instructions were reviewed.  Patient left the clinic in stable condition.

## 2022-03-29 ENCOUNTER — Ambulatory Visit: Payer: 59 | Admitting: Orthopaedic Surgery

## 2022-03-29 ENCOUNTER — Encounter: Payer: Self-pay | Admitting: Orthopaedic Surgery

## 2022-03-29 VITALS — Ht 66.0 in | Wt 251.0 lb

## 2022-03-29 DIAGNOSIS — M5126 Other intervertebral disc displacement, lumbar region: Secondary | ICD-10-CM

## 2022-03-29 NOTE — Progress Notes (Signed)
Office Visit Note   Patient: Melody Stewart           Date of Birth: 06-23-1986           MRN: [id number] Visit Date: 03/29/2022              Requested by: Sasser, Silvestre Moment, MD [place],  [place] [address] PCP: Manon Hilding, MD   Assessment & Plan: Visit Diagnoses:  1. Protrusion of lumbar intervertebral disc     Plan: Patient's had good relief with the epidural.  I will recheck in 6 weeks.  If she continues to improve and has minimal discomfort she can cancel her appointment.  MRI scan is reviewed with her today again we discussed pathophysiology and recommendation for walking program and gradual weight loss avoiding heavy lifting and repetitive turning twisting.  Follow-Up Instructions: Return in about 6 weeks (around 05/10/2022).   Orders:  No orders of the defined types were placed in this encounter.  No orders of the defined types were placed in this encounter.     Procedures: No procedures performed   Clinical Data: No additional findings.   Subjective: Chief Complaint  Patient presents with   Lower Back - Pain, Follow-up    HPI 36 year old female returns post epidural injection 03/16/2022.  She previously had an trochanteric injection by me before the epidural.  Patient states she is significantly better.  She has some mild discomfort and points to her left sacroiliac joint.  She has a 12-year-old daughter and has been trying to gradually work on some weight loss.  BMI 40.  Review of Systems all other systems update noncontributory.   Objective: Vital Signs: Ht '5\' 6"'$  (1.676 m)   Wt 251 lb (113.9 kg)   BMI 40.51 kg/m   Physical Exam Constitutional:      Appearance: She is well-developed.  HENT:     Head: Normocephalic.     Right Ear: External ear normal.     Left Ear: External ear normal. There is no impacted cerumen.  Eyes:     Pupils: Pupils are equal, round, and reactive to light.  Neck:     Thyroid: No thyromegaly.     Trachea: No tracheal  deviation.  Cardiovascular:     Rate and Rhythm: Normal rate.  Pulmonary:     Effort: Pulmonary effort is normal.  Abdominal:     Palpations: Abdomen is soft.  Musculoskeletal:     Cervical back: No rigidity.  Skin:    General: Skin is warm and dry.  Neurological:     Mental Status: She is alert and oriented to person, place, and time.  Psychiatric:        Behavior: Behavior normal.     Ortho Exam negative straight leg raising 90 degrees negative logroll normal heel-toe gait.  She gets from sitting to standing position easily.  No isolated motor weakness lower extremities.  Normal sensation.  Mild tenderness over the left sacroiliac joint.  Specialty Comments:  FINDINGS: Segmentation:  Standard.   Alignment:  2 mm retrolisthesis of L5 on S1.   Vertebrae: No acute fracture, evidence of discitis, or aggressive bone lesion.   Conus medullaris and cauda equina: Conus extends to the L1 level. Conus and cauda equina appear normal.   Paraspinal and other soft tissues: No acute paraspinal abnormality.   Disc levels:   Disc spaces: Mild degenerative disease with disc height loss T12-L1 and L5-S1.   T12-L1: Small central disc  protrusion. No foraminal or central canal stenosis.   L1-L2: Tiny central disc protrusion. No foraminal or central canal stenosis.   L2-L3: No significant disc bulge. No neural foraminal stenosis. No central canal stenosis.   L3-L4: No significant disc bulge. No neural foraminal stenosis. No central canal stenosis.   L4-L5: No significant disc bulge. No neural foraminal stenosis. No central canal stenosis. Mild bilateral facet arthropathy.   L5-S1: Small shallow central disc protrusion. No foraminal or central canal stenosis.   IMPRESSION: 1. At L5-S1 there is a small shallow central disc protrusion without nerve root impingement. 2. At T12-L1 and L1-2 there are small central disc protrusions without nerve root impingement. 3. No acute osseous  injury of the lumbar spine.     Electronically Signed   By: Kathreen Devoid M.D.   On: 12/28/2021 08:41  Imaging: No results found.   PMFS History: Patient Active Problem List   Diagnosis Date Noted   Protrusion of lumbar intervertebral disc 02/22/2022   Trochanteric bursitis, left hip 02/22/2022   Missed period 05/09/2021   Pregnancy examination or test, negative result 05/09/2021   Sensation of feeling hot 05/09/2021   Weight gain 05/09/2021   Screening for diabetes mellitus 05/09/2021   History of abnormal cervical Pap smear 09/20/2020   Encounter for initial prescription of vaginal ring hormonal contraceptive 09/20/2020   Encounter for gynecological examination with Papanicolaou smear of cervix 09/20/2020   Chorioamnionitis 04/18/2018   Kidney stone complicating pregnancy, third trimester 03/15/2018   AKI (acute kidney injury) ([facility]) 03/15/2018   Tinea corporis 02/19/2018   Persistent UTI during pregnancy 09/30/2017   Abnormal Pap smear of cervix 05/25/2016   Anxiety and depression 09/14/2015   Past Medical History:  Diagnosis Date   Anemia    Anxiety and depression 09/14/2015   Body aches    Dysuria    Family history of adverse reaction to anesthesia    mother-- hard to wake   Fibroadenoma of right breast    Frequency of urination    GERD (gastroesophageal reflux disease)    Hydronephrosis 03/24/2018   Bilateral, Right-mild to moderate, Left mild, noted on US renal   IBS (irritable bowel syndrome)    Mild asthma    Nephrolithiasis    Pre-eclampsia 03/2018   Severe   Urgency of urination    UTI (urinary tract infection)     Family History  Problem Relation Age of Onset   Hypertension Mother    Other Mother        mother had child hydrocephic-died   Hypertension Father    Heart disease Paternal Grandfather    Stroke Paternal Grandfather    Thyroid disease Maternal Grandmother    Heart disease Maternal Grandmother    Cancer Paternal Grandmother         breast   Thyroid disease Maternal Aunt    Cancer Paternal Aunt        breast    Past Surgical History:  Procedure Laterality Date   CESAREAN SECTION N/A 04/17/2018   Procedure: CESAREAN SECTION;  Surgeon: Lavonia Drafts, MD;  Location: [facility];  Service: Obstetrics;  Laterality: N/A;   CYSTOSCOPY W/ URETERAL STENT PLACEMENT Bilateral 03/16/2018   Procedure: CYSTOSCOPY WITH RETROGRADE PYELOGRAM/URETERAL STENT PLACEMENT;  Surgeon: Festus Aloe, MD;  Location: [facility];  Service: Urology;  Laterality: Bilateral;   CYSTOSCOPY/URETEROSCOPY/HOLMIUM LASER/STENT PLACEMENT Bilateral 05/06/2018   Procedure: CYSTOSCOPY/URETEROSCOPY/HOLMIUM LASER/STENT EXCHANGE STONE BASKET RETRIVAL;  Surgeon: Festus Aloe, MD;  Location: [facility];  Service: Urology;  Laterality: Bilateral;   KNEE SURGERY Left age 44   TONSILLECTOMY     WISDOM TOOTH EXTRACTION     Social History   Occupational History   Not on file  Tobacco Use   Smoking status: Former    Years: 2.00    Types: Cigarettes    Quit date: 05/02/2014    Years since quitting: 7.9   Smokeless tobacco: Never  Vaping Use   Vaping Use: Never used  Substance and Sexual Activity   Alcohol use: Not Currently   Drug use: Never   Sexual activity: Yes    Birth control/protection: Inserts

## 2022-04-28 DIAGNOSIS — J069 Acute upper respiratory infection, unspecified: Secondary | ICD-10-CM | POA: Diagnosis not present

## 2022-05-03 DIAGNOSIS — Z20828 Contact with and (suspected) exposure to other viral communicable diseases: Secondary | ICD-10-CM | POA: Diagnosis not present

## 2022-05-03 DIAGNOSIS — J019 Acute sinusitis, unspecified: Secondary | ICD-10-CM | POA: Diagnosis not present

## 2022-05-03 DIAGNOSIS — R059 Cough, unspecified: Secondary | ICD-10-CM | POA: Diagnosis not present

## 2022-05-24 ENCOUNTER — Ambulatory Visit: Payer: 59 | Admitting: Orthopaedic Surgery

## 2022-05-26 DIAGNOSIS — R42 Dizziness and giddiness: Secondary | ICD-10-CM | POA: Diagnosis not present

## 2022-05-26 DIAGNOSIS — R197 Diarrhea, unspecified: Secondary | ICD-10-CM | POA: Diagnosis not present

## 2022-05-26 DIAGNOSIS — J45909 Unspecified asthma, uncomplicated: Secondary | ICD-10-CM | POA: Diagnosis not present

## 2022-05-26 DIAGNOSIS — R202 Paresthesia of skin: Secondary | ICD-10-CM | POA: Diagnosis not present

## 2022-05-26 DIAGNOSIS — K589 Irritable bowel syndrome without diarrhea: Secondary | ICD-10-CM | POA: Diagnosis not present

## 2022-05-26 DIAGNOSIS — K529 Noninfective gastroenteritis and colitis, unspecified: Secondary | ICD-10-CM | POA: Diagnosis not present

## 2022-05-26 DIAGNOSIS — Z885 Allergy status to narcotic agent status: Secondary | ICD-10-CM | POA: Diagnosis not present

## 2022-05-26 DIAGNOSIS — E860 Dehydration: Secondary | ICD-10-CM | POA: Diagnosis not present

## 2022-06-07 ENCOUNTER — Encounter: Payer: Self-pay | Admitting: Orthopaedic Surgery

## 2022-06-07 ENCOUNTER — Ambulatory Visit: Payer: 59 | Admitting: Orthopaedic Surgery

## 2022-06-07 VITALS — Ht 66.0 in | Wt 230.0 lb

## 2022-06-07 DIAGNOSIS — M5126 Other intervertebral disc displacement, lumbar region: Secondary | ICD-10-CM | POA: Diagnosis not present

## 2022-06-07 NOTE — Progress Notes (Signed)
Office Visit Note   Patient: Melody Stewart           Date of Birth: October 27, 1985           MRN: [id number] Visit Date: 06/07/2022              Requested by: Sasser, Silvestre Moment, MD [place],  [place] [address] PCP: Manon Hilding, MD   Assessment & Plan: Visit Diagnoses:  1. Protrusion of lumbar intervertebral disc     Plan: Congratulated her on weight loss.  She should get some continued improvement in her back symptoms with ongoing weight reduction.  She will restart core strengthening exercises she was doing at home on her home exercise program.  We discussed breaking up activities that are more likely the father back such as turning twisting with activities such as raking leaves, extra cleaning etc.  She will try to break these up to a little bit and then wait a day or 2 and do a little bit more.  Follow-up with me on an as-needed basis.  MRI results reviewed again pathophysiology discussed.  Follow-Up Instructions: No follow-ups on file.   Orders:  No orders of the defined types were placed in this encounter.  No orders of the defined types were placed in this encounter.     Procedures: No procedures performed   Clinical Data: No additional findings.   Subjective: Chief Complaint  Patient presents with   Lower Back - Pain    HPI 36 year old female returns with ongoing back pain with disc protrusion.  Epidural injection gave her significant relief.  She went over and cleaned out a bunch of cabinets before movement states this aggravated her back symptoms.  She has continue to work on weight loss and has lost 20 to 30 pounds.  She used some Tylenol at night and she had stiffness problems bending and walking.  Over several days she has improved.  Review of Systems all other systems updated unchanged.   Objective: Vital Signs: Ht '5\' 6"'$  (1.676 m)   Wt 230 lb (104.3 kg)   BMI 37.12 kg/m   Physical Exam Constitutional:      Appearance: She is well-developed.   HENT:     Head: Normocephalic.     Right Ear: External ear normal.     Left Ear: External ear normal. There is no impacted cerumen.  Eyes:     Pupils: Pupils are equal, round, and reactive to light.  Neck:     Thyroid: No thyromegaly.     Trachea: No tracheal deviation.  Cardiovascular:     Rate and Rhythm: Normal rate.  Pulmonary:     Effort: Pulmonary effort is normal.  Abdominal:     Palpations: Abdomen is soft.  Musculoskeletal:     Cervical back: No rigidity.  Skin:    General: Skin is warm and dry.  Neurological:     Mental Status: She is alert and oriented to person, place, and time.  Psychiatric:        Behavior: Behavior normal.     Ortho Exam patient gets from sitting standing no isolated motor weakness no rash or exposed skin.  No atrophy.  Specialty Comments:  FINDINGS: Segmentation:  Standard.   Alignment:  2 mm retrolisthesis of L5 on S1.   Vertebrae: No acute fracture, evidence of discitis, or aggressive bone lesion.   Conus medullaris and cauda equina: Conus extends to the L1 level. Conus and cauda equina appear  normal.   Paraspinal and other soft tissues: No acute paraspinal abnormality.   Disc levels:   Disc spaces: Mild degenerative disease with disc height loss T12-L1 and L5-S1.   T12-L1: Small central disc protrusion. No foraminal or central canal stenosis.   L1-L2: Tiny central disc protrusion. No foraminal or central canal stenosis.   L2-L3: No significant disc bulge. No neural foraminal stenosis. No central canal stenosis.   L3-L4: No significant disc bulge. No neural foraminal stenosis. No central canal stenosis.   L4-L5: No significant disc bulge. No neural foraminal stenosis. No central canal stenosis. Mild bilateral facet arthropathy.   L5-S1: Small shallow central disc protrusion. No foraminal or central canal stenosis.   IMPRESSION: 1. At L5-S1 there is a small shallow central disc protrusion without nerve root  impingement. 2. At T12-L1 and L1-2 there are small central disc protrusions without nerve root impingement. 3. No acute osseous injury of the lumbar spine.     Electronically Signed   By: Kathreen Devoid M.D.   On: 12/28/2021 08:41  Imaging: No results found.   PMFS History: Patient Active Problem List   Diagnosis Date Noted   Protrusion of lumbar intervertebral disc 02/22/2022   Trochanteric bursitis, left hip 02/22/2022   Missed period 05/09/2021   Pregnancy examination or test, negative result 05/09/2021   Sensation of feeling hot 05/09/2021   Weight gain 05/09/2021   Screening for diabetes mellitus 05/09/2021   History of abnormal cervical Pap smear 09/20/2020   Encounter for initial prescription of vaginal ring hormonal contraceptive 09/20/2020   Encounter for gynecological examination with Papanicolaou smear of cervix 09/20/2020   Chorioamnionitis 04/18/2018   Kidney stone complicating pregnancy, third trimester 03/15/2018   AKI (acute kidney injury) ([facility]) 03/15/2018   Tinea corporis 02/19/2018   Persistent UTI during pregnancy 09/30/2017   Abnormal Pap smear of cervix 05/25/2016   Anxiety and depression 09/14/2015   Past Medical History:  Diagnosis Date   Anemia    Anxiety and depression 09/14/2015   Body aches    Dysuria    Family history of adverse reaction to anesthesia    mother-- hard to wake   Fibroadenoma of right breast    Frequency of urination    GERD (gastroesophageal reflux disease)    Hydronephrosis 03/24/2018   Bilateral, Right-mild to moderate, Left mild, noted on US renal   IBS (irritable bowel syndrome)    Mild asthma    Nephrolithiasis    Pre-eclampsia 03/2018   Severe   Urgency of urination    UTI (urinary tract infection)     Family History  Problem Relation Age of Onset   Hypertension Mother    Other Mother        mother had child hydrocephic-died   Hypertension Father    Heart disease Paternal Grandfather    Stroke Paternal  Grandfather    Thyroid disease Maternal Grandmother    Heart disease Maternal Grandmother    Cancer Paternal Grandmother        breast   Thyroid disease Maternal Aunt    Cancer Paternal Aunt        breast    Past Surgical History:  Procedure Laterality Date   CESAREAN SECTION N/A 04/17/2018   Procedure: CESAREAN SECTION;  Surgeon: Lavonia Drafts, MD;  Location: [facility];  Service: Obstetrics;  Laterality: N/A;   CYSTOSCOPY W/ URETERAL STENT PLACEMENT Bilateral 03/16/2018   Procedure: CYSTOSCOPY WITH RETROGRADE PYELOGRAM/URETERAL STENT PLACEMENT;  Surgeon: Festus Aloe, MD;  Location:  [facility];  Service: Urology;  Laterality: Bilateral;   CYSTOSCOPY/URETEROSCOPY/HOLMIUM LASER/STENT PLACEMENT Bilateral 05/06/2018   Procedure: CYSTOSCOPY/URETEROSCOPY/HOLMIUM LASER/STENT EXCHANGE STONE BASKET RETRIVAL;  Surgeon: Festus Aloe, MD;  Location: [facility];  Service: Urology;  Laterality: Bilateral;   KNEE SURGERY Left age 77   TONSILLECTOMY     WISDOM TOOTH EXTRACTION     Social History   Occupational History   Not on file  Tobacco Use   Smoking status: Former    Years: 2.00    Types: Cigarettes    Quit date: 05/02/2014    Years since quitting: 8.1   Smokeless tobacco: Never  Vaping Use   Vaping Use: Never used  Substance and Sexual Activity   Alcohol use: Not Currently   Drug use: Never   Sexual activity: Yes    Birth control/protection: Inserts

## 2022-07-13 DIAGNOSIS — Z20828 Contact with and (suspected) exposure to other viral communicable diseases: Secondary | ICD-10-CM | POA: Diagnosis not present

## 2022-07-13 DIAGNOSIS — R030 Elevated blood-pressure reading, without diagnosis of hypertension: Secondary | ICD-10-CM | POA: Diagnosis not present

## 2022-07-13 DIAGNOSIS — J069 Acute upper respiratory infection, unspecified: Secondary | ICD-10-CM | POA: Diagnosis not present

## 2022-07-13 DIAGNOSIS — H6692 Otitis media, unspecified, left ear: Secondary | ICD-10-CM | POA: Diagnosis not present

## 2022-07-13 DIAGNOSIS — Z6837 Body mass index (BMI) 37.0-37.9, adult: Secondary | ICD-10-CM | POA: Diagnosis not present

## 2022-07-27 DIAGNOSIS — Z6838 Body mass index (BMI) 38.0-38.9, adult: Secondary | ICD-10-CM | POA: Diagnosis not present

## 2022-07-27 DIAGNOSIS — H6592 Unspecified nonsuppurative otitis media, left ear: Secondary | ICD-10-CM | POA: Diagnosis not present

## 2022-07-27 DIAGNOSIS — R030 Elevated blood-pressure reading, without diagnosis of hypertension: Secondary | ICD-10-CM | POA: Diagnosis not present

## 2022-08-27 DIAGNOSIS — M25521 Pain in right elbow: Secondary | ICD-10-CM | POA: Diagnosis not present

## 2022-08-27 DIAGNOSIS — R030 Elevated blood-pressure reading, without diagnosis of hypertension: Secondary | ICD-10-CM | POA: Diagnosis not present

## 2022-08-27 DIAGNOSIS — Z6839 Body mass index (BMI) 39.0-39.9, adult: Secondary | ICD-10-CM | POA: Diagnosis not present

## 2022-09-10 DIAGNOSIS — L659 Nonscarring hair loss, unspecified: Secondary | ICD-10-CM | POA: Diagnosis not present

## 2022-09-10 DIAGNOSIS — R69 Illness, unspecified: Secondary | ICD-10-CM | POA: Diagnosis not present

## 2022-09-10 DIAGNOSIS — Z6839 Body mass index (BMI) 39.0-39.9, adult: Secondary | ICD-10-CM | POA: Diagnosis not present

## 2022-09-10 DIAGNOSIS — M778 Other enthesopathies, not elsewhere classified: Secondary | ICD-10-CM | POA: Diagnosis not present

## 2022-09-10 DIAGNOSIS — R030 Elevated blood-pressure reading, without diagnosis of hypertension: Secondary | ICD-10-CM | POA: Diagnosis not present

## 2022-09-10 DIAGNOSIS — R5383 Other fatigue: Secondary | ICD-10-CM | POA: Diagnosis not present

## 2022-09-10 DIAGNOSIS — J069 Acute upper respiratory infection, unspecified: Secondary | ICD-10-CM | POA: Diagnosis not present

## 2022-09-26 DIAGNOSIS — Z6837 Body mass index (BMI) 37.0-37.9, adult: Secondary | ICD-10-CM | POA: Diagnosis not present

## 2022-09-26 DIAGNOSIS — Z20828 Contact with and (suspected) exposure to other viral communicable diseases: Secondary | ICD-10-CM | POA: Diagnosis not present

## 2022-09-26 DIAGNOSIS — J0101 Acute recurrent maxillary sinusitis: Secondary | ICD-10-CM | POA: Diagnosis not present

## 2022-09-26 DIAGNOSIS — J029 Acute pharyngitis, unspecified: Secondary | ICD-10-CM | POA: Diagnosis not present

## 2022-09-26 DIAGNOSIS — R030 Elevated blood-pressure reading, without diagnosis of hypertension: Secondary | ICD-10-CM | POA: Diagnosis not present

## 2022-09-28 ENCOUNTER — Ambulatory Visit (INDEPENDENT_AMBULATORY_CARE_PROVIDER_SITE_OTHER): Payer: 59 | Admitting: Orthopaedic Surgery

## 2022-09-28 ENCOUNTER — Encounter: Payer: Self-pay | Admitting: Orthopaedic Surgery

## 2022-09-28 VITALS — BP 111/76 | HR 81 | Ht 66.0 in | Wt 230.0 lb

## 2022-09-28 DIAGNOSIS — M5126 Other intervertebral disc displacement, lumbar region: Secondary | ICD-10-CM

## 2022-09-28 MED ORDER — CYCLOBENZAPRINE HCL 10 MG PO TABS: 10.0000 mg | ORAL_TABLET | Freq: Every evening | ORAL | 0 refills | Status: DC | PRN

## 2022-09-28 MED ORDER — TRAMADOL HCL 50 MG PO TABS: 50.0000 mg | ORAL_TABLET | Freq: Two times a day (BID) | ORAL | 0 refills | Status: DC | PRN

## 2022-10-01 NOTE — Progress Notes (Addendum)
Office Visit Note   Patient: Melody Stewart           Date of Birth: [birth year]           MRN: [id number] Visit Date: 09/28/2022              Requested by: Sasser, Silvestre Moment, MD [place],  [place] [id number] PCP: Manon Hilding, MD   Assessment & Plan: Visit Diagnoses:  1. Protrusion of lumbar intervertebral disc     Plan: Previous MRI showed some 2 mm retrolisthesis L5-S1 with small central protrusion.  She is having left more than right leg pain.  Will set up for an epidural and follow her up after epidural if she still having ongoing symptoms. the pain that travels to the leg is in a pattern related to the level of the spine to be treated L5-S1 disc protrusion.  Follow-Up Instructions: No follow-ups on file.   Orders:  Orders Placed This Encounter  Procedures   Ambulatory referral to Physical Medicine Rehab   Meds ordered this encounter  Medications   traMADol (ULTRAM) 50 MG tablet    Sig: Take 1 tablet (50 mg total) by mouth every 12 (twelve) hours as needed.    Dispense:  30 tablet    Refill:  0   cyclobenzaprine (FLEXERIL) 10 MG tablet    Sig: Take 1 tablet (10 mg total) by mouth at bedtime as needed for muscle spasms.    Dispense:  30 tablet    Refill:  0      Procedures: No procedures performed   Clinical Data: No additional findings.   Subjective: Chief Complaint  Patient presents with   Lower Back - Pain    HPI 37 year old female with low back pain that started last weekend.  She thought it was getting better Monday and Tuesday and then got worse later in the week.  She has been doing well post epidural steroid injection has been taking Aleve.  This morning she had significant increased pain and therefore-year-old had to help her put her shoes on.  Pains across her back radiates to her left leg stops at her knee.  She has disc degeneration L5-S1.  She has muscle relaxant heat and also Aleve. Patient's office notes document that her previous injection  was on 03/16/2022 and she did not require any therapy or other interventions until she is developed the significant recurrent pain last week.  Documented on 06/07/2022 note is that she has not had improvement in function, weight loss she was continue her home exercise program and has been able to be active with her child without pain problems.  She related greater than 80% pain relief with the previous injection in August 2023.   Review of Systems all systems noncontributory to HPI, updated unchanged from 06/07/2022.   Objective: Vital Signs: BP 111/76   Pulse 81   Ht 5\' 6"  (1.676 m)   Wt 230 lb (104.3 kg)   BMI 37.12 kg/m   Physical Exam Constitutional:      Appearance: She is well-developed.  HENT:     Head: Normocephalic.     Right Ear: External ear normal.     Left Ear: External ear normal. There is no impacted cerumen.  Eyes:     Pupils: Pupils are equal, round, and reactive to light.  Neck:     Thyroid: No thyromegaly.     Trachea: No tracheal deviation.  Cardiovascular:     Rate  and Rhythm: Normal rate.  Pulmonary:     Effort: Pulmonary effort is normal.  Abdominal:     Palpations: Abdomen is soft.  Musculoskeletal:     Cervical back: No rigidity.  Skin:    General: Skin is warm and dry.  Neurological:     Mental Status: She is alert and oriented to person, place, and time.  Psychiatric:        Behavior: Behavior normal.     Ortho Exampatient gets from sitting standing no isolated motor weakness no rash or exposed skin. No atrophy.  She has tenderness with sciatic palpation left greater than right mild trochanteric bursal tenderness pain with straight leg raising 80 degrees on the left -90 degrees on the right.  She is able to heel and toe walk but has back pain leg pain with heel walking.  Specialty Comments:  FINDINGS: Segmentation:  Standard.   Alignment:  2 mm retrolisthesis of L5 on S1.   Vertebrae: No acute fracture, evidence of discitis, or  aggressive bone lesion.   Conus medullaris and cauda equina: Conus extends to the L1 level. Conus and cauda equina appear normal.   Paraspinal and other soft tissues: No acute paraspinal abnormality.   Disc levels:   Disc spaces: Mild degenerative disease with disc height loss T12-L1 and L5-S1.   T12-L1: Small central disc protrusion. No foraminal or central canal stenosis.   L1-L2: Tiny central disc protrusion. No foraminal or central canal stenosis.   L2-L3: No significant disc bulge. No neural foraminal stenosis. No central canal stenosis.   L3-L4: No significant disc bulge. No neural foraminal stenosis. No central canal stenosis.   L4-L5: No significant disc bulge. No neural foraminal stenosis. No central canal stenosis. Mild bilateral facet arthropathy.   L5-S1: Small shallow central disc protrusion. No foraminal or central canal stenosis.   IMPRESSION: 1. At L5-S1 there is a small shallow central disc protrusion without nerve root impingement. 2. At T12-L1 and L1-2 there are small central disc protrusions without nerve root impingement. 3. No acute osseous injury of the lumbar spine.     Electronically Signed   By: Kathreen Devoid M.D.   On: 12/28/2021 08:41  Imaging: No results found.   PMFS History: Patient Active Problem List   Diagnosis Date Noted   Protrusion of lumbar intervertebral disc 02/22/2022   Trochanteric bursitis, left hip 02/22/2022   Missed period 05/09/2021   Pregnancy examination or test, negative result 05/09/2021   Sensation of feeling hot 05/09/2021   Weight gain 05/09/2021   Screening for diabetes mellitus 05/09/2021   History of abnormal cervical Pap smear 09/20/2020   Encounter for initial prescription of vaginal ring hormonal contraceptive 09/20/2020   Encounter for gynecological examination with Papanicolaou smear of cervix 09/20/2020   Chorioamnionitis 04/18/2018   Kidney stone complicating pregnancy, third trimester  03/15/2018   AKI (acute kidney injury) ([place]) 03/15/2018   Tinea corporis 02/19/2018   Persistent UTI during pregnancy 09/30/2017   Abnormal Pap smear of cervix 05/25/2016   Anxiety and depression 09/14/2015   Past Medical History:  Diagnosis Date   Anemia    Anxiety and depression 09/14/2015   Body aches    Dysuria    Family history of adverse reaction to anesthesia    mother-- hard to wake   Fibroadenoma of right breast    Frequency of urination    GERD (gastroesophageal reflux disease)    Hydronephrosis 03/24/2018   Bilateral, Right-mild to moderate, Left mild, noted on US  renal   IBS (irritable bowel syndrome)    Mild asthma    Nephrolithiasis    Pre-eclampsia 03/2018   Severe   Urgency of urination    UTI (urinary tract infection)     Family History  Problem Relation Age of Onset   Hypertension Mother    Other Mother        mother had child hydrocephic-died   Hypertension Father    Heart disease Paternal Grandfather    Stroke Paternal Grandfather    Thyroid disease Maternal Grandmother    Heart disease Maternal Grandmother    Cancer Paternal Grandmother        breast   Thyroid disease Maternal Aunt    Cancer Paternal Aunt        breast    Past Surgical History:  Procedure Laterality Date   CESAREAN SECTION N/A 04/17/2018   Procedure: CESAREAN SECTION;  Surgeon: Lavonia Drafts, MD;  Location: [facility];  Service: Obstetrics;  Laterality: N/A;   CYSTOSCOPY W/ URETERAL STENT PLACEMENT Bilateral 03/16/2018   Procedure: CYSTOSCOPY WITH RETROGRADE PYELOGRAM/URETERAL STENT PLACEMENT;  Surgeon: Festus Aloe, MD;  Location: [facility];  Service: Urology;  Laterality: Bilateral;   CYSTOSCOPY/URETEROSCOPY/HOLMIUM LASER/STENT PLACEMENT Bilateral 05/06/2018   Procedure: CYSTOSCOPY/URETEROSCOPY/HOLMIUM LASER/STENT EXCHANGE STONE BASKET RETRIVAL;  Surgeon: Festus Aloe, MD;  Location: [facility];  Service: Urology;  Laterality: Bilateral;    KNEE SURGERY Left age 89   TONSILLECTOMY     WISDOM TOOTH EXTRACTION     Social History   Occupational History   Not on file  Tobacco Use   Smoking status: Former    Years: 2.00    Types: Cigarettes    Quit date: 05/02/2014    Years since quitting: 8.4   Smokeless tobacco: Never  Vaping Use   Vaping Use: Never used  Substance and Sexual Activity   Alcohol use: Not Currently   Drug use: Never   Sexual activity: Yes    Birth control/protection: Inserts

## 2022-10-02 ENCOUNTER — Telehealth: Payer: Self-pay | Admitting: Physical Medicine and Rehabilitation

## 2022-10-02 NOTE — Telephone Encounter (Signed)
Spoke with patient and informed her that we are still waiting on her last note from Dr. Lorin Mercy and then we have to submit to insurance

## 2022-10-02 NOTE — Telephone Encounter (Signed)
Patient waiting on a call about her injection. Please advise

## 2022-10-08 ENCOUNTER — Telehealth: Payer: Self-pay | Admitting: Physical Medicine and Rehabilitation

## 2022-10-08 NOTE — Telephone Encounter (Signed)
I called the patient and let her know the status of the insurance prior auths: [employer] (her primary) denied the authorization, but I am submitting an amended ov note (by Dr. Lorin Mercy) to [employer]. Hopefully will have a favorable outcome. The [facility] is still pending -- both of these were submitted on 10/02/22. I will keep a check on the status of these and will let Brittney know if/when it is ok to schedule her.

## 2022-10-08 NOTE — Telephone Encounter (Signed)
Pt called requesting a call back from [place] J. I spoke with Gwinda Passe and she states injection was submitted to insurance on 3/18. Pt states she spoke with her insurance company and if we submit urgent that the approval will take 24 to 48 hrs. She is asking for submit as urgent.Marland Kitchen Pt phone number is [phone].

## 2022-10-09 NOTE — Telephone Encounter (Signed)
I called the patient to advise her both insurances approved the injection. She has been scheduled with Dr. Ernestina Patches for 3:45 on 10/11/22. She will have a driver.

## 2022-10-11 ENCOUNTER — Ambulatory Visit (INDEPENDENT_AMBULATORY_CARE_PROVIDER_SITE_OTHER): Payer: 59 | Admitting: Physical Medicine and Rehabilitation

## 2022-10-11 ENCOUNTER — Other Ambulatory Visit: Payer: Self-pay

## 2022-10-11 VITALS — BP 111/79 | HR 93

## 2022-10-11 DIAGNOSIS — M5416 Radiculopathy, lumbar region: Secondary | ICD-10-CM | POA: Diagnosis not present

## 2022-10-11 MED ORDER — METHYLPREDNISOLONE ACETATE 80 MG/ML IJ SUSP
80.0000 mg | Freq: Once | INTRAMUSCULAR | Status: AC
  Administered 2022-10-11: 80 mg

## 2022-10-11 NOTE — Progress Notes (Signed)
Functional Pain Scale - descriptive words and definitions  Distressing (6)    Pain is present/unable to complete most ADLs limited by pain/sleep is difficult and active distraction is only marginal. Moderate range order  Average Pain  today is 3, last week was 9-10   +Driver, -BT, -Dye Allergies.  Lower back pain on left side that radiates in the left leg to the knee

## 2022-10-11 NOTE — Patient Instructions (Signed)

## 2022-10-17 NOTE — Procedures (Signed)
Lumbar Epidural Steroid Injection - Interlaminar Approach with Fluoroscopic Guidance  Patient: Melody Stewart      Date of Birth: 08-26-1985 MRN: [id number] PCP: Manon Hilding, MD      Visit Date: 10/11/2022   Universal Protocol:     Consent Given By: the patient  Position: PRONE  Additional Comments: Vital signs were monitored before and after the procedure. Patient was prepped and draped in the usual sterile fashion. The correct patient, procedure, and site was verified.   Injection Procedure Details:   Procedure diagnoses: Lumbar radiculopathy [M54.16]   Meds Administered:  Meds ordered this encounter  Medications   methylPREDNISolone acetate (DEPO-MEDROL) injection 80 mg     Laterality: Left  Location/Site:  L5-S1  Needle: 4.5 in., 20 ga. Tuohy  Needle Placement: Paramedian epidural  Findings:   -Comments: Excellent flow of contrast into the epidural space.  Procedure Details: Using a paramedian approach from the side mentioned above, the region overlying the inferior lamina was localized under fluoroscopic visualization and the soft tissues overlying this structure were infiltrated with 4 ml. of 1% Lidocaine without Epinephrine. The Tuohy needle was inserted into the epidural space using a paramedian approach.   The epidural space was localized using loss of resistance along with counter oblique bi-planar fluoroscopic views.  After negative aspirate for air, blood, and CSF, a 2 ml. volume of Isovue-250 was injected into the epidural space and the flow of contrast was observed. Radiographs were obtained for documentation purposes.    The injectate was administered into the level noted above.   Additional Comments:  No complications occurred Dressing: 2 x 2 sterile gauze and Band-Aid    Post-procedure details: Patient was observed during the procedure. Post-procedure instructions were reviewed.  Patient left the clinic in stable condition.

## 2022-10-17 NOTE — Progress Notes (Signed)
Melody Stewart - 37 y.o. female MRN [id number]  Date of birth: 1986-03-29  Office Visit Note: Visit Date: 10/11/2022 PCP: Manon Hilding, MD Referred by: Sasser, Silvestre Moment, MD  Subjective: Chief Complaint  Patient presents with   Lower Back - Pain   HPI:  Melody Stewart is a 37 y.o. female who comes in today for planned repeat Left L5-S1  Lumbar Interlaminar epidural steroid injection with fluoroscopic guidance.  The patient has failed conservative care including home exercise, medications, time and activity modification.  This injection will be diagnostic and hopefully therapeutic.  Please see requesting physician notes for further details and justification. Patient received more than 50% pain relief from prior injection.   Referring: Dr. Rodell Perna   ROS Otherwise per HPI.  Assessment & Plan: Visit Diagnoses:    ICD-10-CM   1. Lumbar radiculopathy  M54.16 XR C-ARM NO REPORT    Epidural Steroid injection    methylPREDNISolone acetate (DEPO-MEDROL) injection 80 mg      Plan: No additional findings.   Meds & Orders:  Meds ordered this encounter  Medications   methylPREDNISolone acetate (DEPO-MEDROL) injection 80 mg    Orders Placed This Encounter  Procedures   XR C-ARM NO REPORT   Epidural Steroid injection    Follow-up: Return for visit to requesting provider as needed.   Procedures: No procedures performed  Lumbar Epidural Steroid Injection - Interlaminar Approach with Fluoroscopic Guidance  Patient: Melody Stewart      Date of Birth: [birth year] MRN: [id number] PCP: Manon Hilding, MD      Visit Date: 10/11/2022   Universal Protocol:     Consent Given By: the patient  Position: PRONE  Additional Comments: Vital signs were monitored before and after the procedure. Patient was prepped and draped in the usual sterile fashion. The correct patient, procedure, and site was verified.   Injection Procedure Details:   Procedure diagnoses: Lumbar radiculopathy  [M54.16]   Meds Administered:  Meds ordered this encounter  Medications   methylPREDNISolone acetate (DEPO-MEDROL) injection 80 mg     Laterality: Left  Location/Site:  L5-S1  Needle: 4.5 in., 20 ga. Tuohy  Needle Placement: Paramedian epidural  Findings:   -Comments: Excellent flow of contrast into the epidural space.  Procedure Details: Using a paramedian approach from the side mentioned above, the region overlying the inferior lamina was localized under fluoroscopic visualization and the soft tissues overlying this structure were infiltrated with 4 ml. of 1% Lidocaine without Epinephrine. The Tuohy needle was inserted into the epidural space using a paramedian approach.   The epidural space was localized using loss of resistance along with counter oblique bi-planar fluoroscopic views.  After negative aspirate for air, blood, and CSF, a 2 ml. volume of Isovue-250 was injected into the epidural space and the flow of contrast was observed. Radiographs were obtained for documentation purposes.    The injectate was administered into the level noted above.   Additional Comments:  No complications occurred Dressing: 2 x 2 sterile gauze and Band-Aid    Post-procedure details: Patient was observed during the procedure. Post-procedure instructions were reviewed.  Patient left the clinic in stable condition.   Clinical History: FINDINGS: Segmentation:  Standard.   Alignment:  2 mm retrolisthesis of L5 on S1.   Vertebrae: No acute fracture, evidence of discitis, or aggressive bone lesion.   Conus medullaris and cauda equina: Conus extends to the L1 level. Conus and cauda equina appear normal.   Paraspinal  and other soft tissues: No acute paraspinal abnormality.   Disc levels:   Disc spaces: Mild degenerative disease with disc height loss T12-L1 and L5-S1.   T12-L1: Small central disc protrusion. No foraminal or central canal stenosis.   L1-L2: Tiny central disc  protrusion. No foraminal or central canal stenosis.   L2-L3: No significant disc bulge. No neural foraminal stenosis. No central canal stenosis.   L3-L4: No significant disc bulge. No neural foraminal stenosis. No central canal stenosis.   L4-L5: No significant disc bulge. No neural foraminal stenosis. No central canal stenosis. Mild bilateral facet arthropathy.   L5-S1: Small shallow central disc protrusion. No foraminal or central canal stenosis.   IMPRESSION: 1. At L5-S1 there is a small shallow central disc protrusion without nerve root impingement. 2. At T12-L1 and L1-2 there are small central disc protrusions without nerve root impingement. 3. No acute osseous injury of the lumbar spine.     Electronically Signed   By: Kathreen Devoid M.D.   On: 12/28/2021 08:41     Objective:  VS:  HT:    WT:   BMI:     BP:111/79  HR:93bpm  TEMP: ( )  RESP:  Physical Exam Vitals and nursing note reviewed.  Constitutional:      General: She is not in acute distress.    Appearance: Normal appearance. She is not ill-appearing.  HENT:     Head: Normocephalic and atraumatic.     Right Ear: External ear normal.     Left Ear: External ear normal.  Eyes:     Extraocular Movements: Extraocular movements intact.  Cardiovascular:     Rate and Rhythm: Normal rate.     Pulses: Normal pulses.  Pulmonary:     Effort: Pulmonary effort is normal. No respiratory distress.  Abdominal:     General: There is no distension.     Palpations: Abdomen is soft.  Musculoskeletal:        General: Tenderness present.     Cervical back: Neck supple.     Right lower leg: No edema.     Left lower leg: No edema.     Comments: Patient has good distal strength with no pain over the greater trochanters.  No clonus or focal weakness.  Skin:    Findings: No erythema, lesion or rash.  Neurological:     General: No focal deficit present.     Mental Status: She is alert and oriented to person, place, and  time.     Sensory: No sensory deficit.     Motor: No weakness or abnormal muscle tone.     Coordination: Coordination normal.  Psychiatric:        Mood and Affect: Mood normal.        Behavior: Behavior normal.      Imaging: No results found.

## 2022-11-06 DIAGNOSIS — Z6835 Body mass index (BMI) 35.0-35.9, adult: Secondary | ICD-10-CM | POA: Diagnosis not present

## 2022-11-06 DIAGNOSIS — J309 Allergic rhinitis, unspecified: Secondary | ICD-10-CM | POA: Diagnosis not present

## 2022-11-06 DIAGNOSIS — M778 Other enthesopathies, not elsewhere classified: Secondary | ICD-10-CM | POA: Diagnosis not present

## 2022-11-06 DIAGNOSIS — R5383 Other fatigue: Secondary | ICD-10-CM | POA: Diagnosis not present

## 2022-11-06 DIAGNOSIS — R030 Elevated blood-pressure reading, without diagnosis of hypertension: Secondary | ICD-10-CM | POA: Diagnosis not present

## 2022-11-06 DIAGNOSIS — F411 Generalized anxiety disorder: Secondary | ICD-10-CM | POA: Diagnosis not present

## 2022-12-03 DIAGNOSIS — H6592 Unspecified nonsuppurative otitis media, left ear: Secondary | ICD-10-CM | POA: Diagnosis not present

## 2022-12-03 DIAGNOSIS — J0101 Acute recurrent maxillary sinusitis: Secondary | ICD-10-CM | POA: Diagnosis not present

## 2022-12-03 DIAGNOSIS — J029 Acute pharyngitis, unspecified: Secondary | ICD-10-CM | POA: Diagnosis not present

## 2022-12-03 DIAGNOSIS — R030 Elevated blood-pressure reading, without diagnosis of hypertension: Secondary | ICD-10-CM | POA: Diagnosis not present

## 2022-12-05 ENCOUNTER — Other Ambulatory Visit: Payer: Self-pay | Admitting: Obstetrics & Gynecology

## 2022-12-05 DIAGNOSIS — Z113 Encounter for screening for infections with a predominantly sexual mode of transmission: Secondary | ICD-10-CM

## 2022-12-05 DIAGNOSIS — Z3009 Encounter for other general counseling and advice on contraception: Secondary | ICD-10-CM

## 2022-12-10 ENCOUNTER — Ambulatory Visit: Payer: 59 | Admitting: Obstetrics & Gynecology

## 2022-12-21 ENCOUNTER — Other Ambulatory Visit: Payer: 59

## 2022-12-21 ENCOUNTER — Other Ambulatory Visit: Payer: Self-pay | Admitting: Obstetrics & Gynecology

## 2022-12-21 ENCOUNTER — Ambulatory Visit (INDEPENDENT_AMBULATORY_CARE_PROVIDER_SITE_OTHER): Payer: 59

## 2022-12-21 ENCOUNTER — Ambulatory Visit (INDEPENDENT_AMBULATORY_CARE_PROVIDER_SITE_OTHER): Payer: 59 | Admitting: *Deleted

## 2022-12-21 VITALS — BP 124/79 | HR 110 | Ht 66.0 in | Wt 221.0 lb

## 2022-12-21 DIAGNOSIS — Z3201 Encounter for pregnancy test, result positive: Secondary | ICD-10-CM | POA: Diagnosis not present

## 2022-12-21 DIAGNOSIS — N926 Irregular menstruation, unspecified: Secondary | ICD-10-CM

## 2022-12-21 DIAGNOSIS — Z3A01 Less than 8 weeks gestation of pregnancy: Secondary | ICD-10-CM | POA: Diagnosis not present

## 2022-12-21 DIAGNOSIS — O3680X0 Pregnancy with inconclusive fetal viability, not applicable or unspecified: Secondary | ICD-10-CM

## 2022-12-21 LAB — POCT URINE PREGNANCY: Preg Test, Ur: POSITIVE — AB

## 2022-12-21 NOTE — Progress Notes (Addendum)
OB US TA/TV:homogeneous anteverted uterus,no IUP visualize,EEC 13 mm,multiple small simple nabothian cysts,normal ovaries,no free fluid,labs ordered per [employer]

## 2022-12-21 NOTE — Progress Notes (Signed)
   NURSE VISIT- PREGNANCY CONFIRMATION   SUBJECTIVE:  Melody Stewart is a 37 y.o. G52P1001 female at [redacted]w[redacted]d by certain LMP of Patient's last menstrual period was 11/09/2022. Here for pregnancy confirmation.  Home pregnancy test: positive x 1   She reports cramping.  She is not taking prenatal vitamins.    OBJECTIVE:  BP 124/79 (BP Location: Left Arm, Patient Position: Sitting, Cuff Size: Normal)   Pulse (!) 110   Ht 5\' 6"  (1.676 m)   Wt 221 lb (100.2 kg)   LMP 11/09/2022   BMI 35.67 kg/m   Appears well, in no apparent distress  Results for orders placed or performed in visit on 12/21/22 (from the past 24 hour(s))  POCT urine pregnancy   Collection Time: 12/21/22  9:43 AM  Result Value Ref Range   Preg Test, Ur Positive (A) Negative    ASSESSMENT: Positive pregnancy test, [redacted]w[redacted]d by LMP    PLAN: Schedule for dating ultrasound in next available   Prenatal vitamins: plans to begin OTC ASAP   Nausea medicines: not currently needed   OB packet given: Yes  Jobe Marker  12/21/2022 9:44 AM

## 2022-12-22 LAB — BETA HCG QUANT (REF LAB): hCG Quant: 1222 m[IU]/mL

## 2022-12-24 ENCOUNTER — Other Ambulatory Visit: Payer: Self-pay | Admitting: Adult Health

## 2022-12-24 DIAGNOSIS — Z3201 Encounter for pregnancy test, result positive: Secondary | ICD-10-CM

## 2022-12-25 LAB — BETA HCG QUANT (REF LAB): hCG Quant: 2884 m[IU]/mL

## 2023-01-10 ENCOUNTER — Other Ambulatory Visit: Payer: 59

## 2023-01-14 ENCOUNTER — Ambulatory Visit: Payer: 59 | Admitting: Obstetrics & Gynecology

## 2023-01-15 ENCOUNTER — Other Ambulatory Visit: Payer: Self-pay | Admitting: Adult Health

## 2023-01-15 DIAGNOSIS — O3680X0 Pregnancy with inconclusive fetal viability, not applicable or unspecified: Secondary | ICD-10-CM

## 2023-01-16 ENCOUNTER — Other Ambulatory Visit: Payer: 59

## 2023-01-31 ENCOUNTER — Ambulatory Visit (INDEPENDENT_AMBULATORY_CARE_PROVIDER_SITE_OTHER): Payer: Medicaid Other

## 2023-01-31 DIAGNOSIS — O3680X0 Pregnancy with inconclusive fetal viability, not applicable or unspecified: Secondary | ICD-10-CM

## 2023-01-31 DIAGNOSIS — Z3A11 11 weeks gestation of pregnancy: Secondary | ICD-10-CM

## 2023-01-31 DIAGNOSIS — Z3481 Encounter for supervision of other normal pregnancy, first trimester: Secondary | ICD-10-CM | POA: Diagnosis not present

## 2023-01-31 NOTE — Progress Notes (Signed)
[place] 11 wks,single IUP with yolk sac,CRL 41.05 mm,normal ovaries,FHR 169 bpm

## 2023-02-14 ENCOUNTER — Encounter: Payer: Self-pay | Admitting: Women's Health

## 2023-02-14 DIAGNOSIS — Z3490 Encounter for supervision of normal pregnancy, unspecified, unspecified trimester: Secondary | ICD-10-CM | POA: Insufficient documentation

## 2023-02-19 ENCOUNTER — Encounter: Payer: Medicaid Other | Admitting: *Deleted

## 2023-02-19 ENCOUNTER — Ambulatory Visit (INDEPENDENT_AMBULATORY_CARE_PROVIDER_SITE_OTHER): Payer: Medicaid Other | Admitting: Women's Health

## 2023-02-19 ENCOUNTER — Encounter: Payer: Self-pay | Admitting: Women's Health

## 2023-02-19 VITALS — BP 119/70 | HR 80 | Wt 242.0 lb

## 2023-02-19 DIAGNOSIS — Z363 Encounter for antenatal screening for malformations: Secondary | ICD-10-CM

## 2023-02-19 DIAGNOSIS — O09299 Supervision of pregnancy with other poor reproductive or obstetric history, unspecified trimester: Secondary | ICD-10-CM | POA: Insufficient documentation

## 2023-02-19 DIAGNOSIS — F32A Depression, unspecified: Secondary | ICD-10-CM

## 2023-02-19 DIAGNOSIS — Z8759 Personal history of other complications of pregnancy, childbirth and the puerperium: Secondary | ICD-10-CM | POA: Diagnosis not present

## 2023-02-19 DIAGNOSIS — Z113 Encounter for screening for infections with a predominantly sexual mode of transmission: Secondary | ICD-10-CM

## 2023-02-19 DIAGNOSIS — Z3A14 14 weeks gestation of pregnancy: Secondary | ICD-10-CM

## 2023-02-19 DIAGNOSIS — Z3480 Encounter for supervision of other normal pregnancy, unspecified trimester: Secondary | ICD-10-CM

## 2023-02-19 DIAGNOSIS — Z98891 History of uterine scar from previous surgery: Secondary | ICD-10-CM | POA: Insufficient documentation

## 2023-02-19 DIAGNOSIS — Z3482 Encounter for supervision of other normal pregnancy, second trimester: Secondary | ICD-10-CM

## 2023-02-19 DIAGNOSIS — O09292 Supervision of pregnancy with other poor reproductive or obstetric history, second trimester: Secondary | ICD-10-CM | POA: Diagnosis not present

## 2023-02-19 DIAGNOSIS — F419 Anxiety disorder, unspecified: Secondary | ICD-10-CM

## 2023-02-19 MED ORDER — ASPIRIN 81 MG PO TBEC: 162.0000 mg | DELAYED_RELEASE_TABLET | Freq: Every day | ORAL | 2 refills | Status: DC

## 2023-02-19 NOTE — Patient Instructions (Signed)
Melody Stewart, thank you for choosing our office today! We appreciate the opportunity to meet your healthcare needs. You may receive a short survey by mail, e-mail, or through MyChart. If you are happy with your care we would appreciate if you could take just a few minutes to complete the survey questions. We read all of your comments and take your feedback very seriously. Thank you again for choosing our office.  [facility] Team at [facility] ([address] [place], [place] [address]) Entrance C, located off of [address] Free 24/7 valet parking  Go to [email or web address] to register for FREE online childbirth classes  Call the office ([phone]) or go to [facility] if: You begin to severe cramping Your water breaks.  Sometimes it is a big gush of fluid, sometimes it is just a trickle that keeps getting your panties wet or running down your legs You have vaginal bleeding.  It is normal to have a small amount of spotting if your cervix was checked.   [place] [facility] ([facility]): [address], [phone]           [facility]: [address], [phone]                [facility] ([facility]): [address], [phone] (call to ask if accepting patients) [facility]: [address], [place], [phone]    [place] [facility]/Family Doctors [facility] ([facility]): [address], [phone] [facility]: [address], [phone] [facility]: [address], [phone]  [place] [facility]  [facility] ([facility]): [phone] [facility]: [address], [phone]   [place] [facility]: [address], [phone]  [facility]  [facility]: [address], [phone]  Home Blood Pressure Monitoring for Patients   Your provider has recommended that you check your blood pressure (BP) at least once a week at home. If you do not have a blood pressure cuff at home, one will be provided for you. Contact your provider if you have not received your monitor within 1 week.   Helpful Tips for Accurate Home Blood Pressure Checks  Don't smoke, exercise, or drink caffeine 30 minutes before checking your BP Use the restroom before checking your BP (a full bladder can raise your pressure) Relax in a comfortable upright chair Feet on the ground Left arm resting comfortably on a flat surface at the level of your heart Legs uncrossed Back supported Sit quietly and don't talk Place the cuff on your bare arm Adjust snuggly, so that only two fingertips can fit between your skin and the top of the cuff Check 2 readings separated by at least one minute Keep a log of your BP readings For a visual, please reference this diagram: http://ccnc.care/bpdiagram  Provider Name: [facility]     Phone: [phone]  Zone 1: ALL CLEAR  Continue to monitor your symptoms:  BP reading is less than 140 (top number) or less than 90 (bottom number)  No right upper stomach pain No headaches or seeing spots No feeling nauseated or throwing up No swelling in face and hands  Zone 2: CAUTION Call your doctor's office for any of the following:  BP reading is greater than 140 (top number) or greater than   90 (bottom number)  Stomach pain under your ribs in the middle or right side Headaches or seeing spots Feeling nauseated or throwing up Swelling in face and hands  Zone 3: EMERGENCY  Seek immediate medical care if you have any of the following:  BP reading is greater than160 (top number) or greater than 110 (bottom number) Severe headaches not improving with Tylenol Serious difficulty catching your breath Any worsening symptoms from  Zone 2     Second Trimester of Pregnancy The second trimester is from week 14 through week 27 (months 4 through 6). The second trimester is often a time when you feel your best. Your body has adjusted to being pregnant, and you begin to feel better physically. Usually, morning sickness has lessened or quit completely, you may have more energy, and you may have an increase in appetite. The second trimester is also a time when the fetus is growing rapidly. At the end of the sixth month, the fetus is about 9 inches long and weighs about 1 pounds. You will likely begin to feel the baby move (quickening) between 16 and 20 weeks of pregnancy. Body changes during your second trimester Your body continues to go through many changes during your second trimester. The changes vary from woman to woman. Your weight will continue to increase. You will notice your lower abdomen bulging out. You may begin to get stretch marks on your hips, abdomen, and breasts. You may develop headaches that can be relieved by medicines. The medicines should be approved by your health care provider. You may urinate more often because the fetus is pressing on your bladder. You may develop or continue to have heartburn as a result of your pregnancy. You may develop constipation because certain hormones are causing the muscles that push waste through your intestines to slow down. You may develop hemorrhoids or swollen, bulging veins (varicose veins). You may have back pain. This is caused by: Weight gain. Pregnancy hormones that are relaxing the joints in your pelvis. A shift in weight and the muscles that support your balance. Your breasts will continue to grow and they will continue to become tender. Your gums may bleed and may be sensitive to brushing and flossing. Dark spots or blotches (chloasma, mask of pregnancy) may develop on your face. This will likely fade after the baby is born. A dark line from your belly button to  the pubic area (linea nigra) may appear. This will likely fade after the baby is born. You may have changes in your hair. These can include thickening of your hair, rapid growth, and changes in texture. Some women also have hair loss during or after pregnancy, or hair that feels dry or thin. Your hair will most likely return to normal after your baby is born.  What to expect at prenatal visits During a routine prenatal visit: You will be weighed to make sure you and the fetus are growing normally. Your blood pressure will be taken. Your abdomen will be measured to track your baby's growth. The fetal heartbeat will be listened to. Any test results from the previous visit will be discussed.  Your health care provider may ask you: How you are feeling. If you are feeling the baby move. If you have had any abnormal symptoms, such as leaking fluid, bleeding, severe headaches, or abdominal cramping. If you are using any tobacco products, including cigarettes, chewing tobacco, and electronic cigarettes. If you have any questions.  Other tests that may be performed during   your second trimester include: Blood tests that check for: Low iron levels (anemia). High blood sugar that affects pregnant women (gestational diabetes) between 24 and 28 weeks. Rh antibodies. This is to check for a protein on red blood cells (Rh factor). Urine tests to check for infections, diabetes, or protein in the urine. An ultrasound to confirm the proper growth and development of the baby. An amniocentesis to check for possible genetic problems. Fetal screens for spina bifida and Down syndrome. HIV (human immunodeficiency virus) testing. Routine prenatal testing includes screening for HIV, unless you choose not to have this test.  Follow these instructions at home: Medicines Follow your health care provider's instructions regarding medicine use. Specific medicines may be either safe or unsafe to take during  pregnancy. Take a prenatal vitamin that contains at least 600 micrograms (mcg) of folic acid. If you develop constipation, try taking a stool softener if your health care provider approves. Eating and drinking Eat a balanced diet that includes fresh fruits and vegetables, whole grains, good sources of protein such as meat, eggs, or tofu, and low-fat dairy. Your health care provider will help you determine the amount of weight gain that is right for you. Avoid raw meat and uncooked cheese. These carry germs that can cause birth defects in the baby. If you have low calcium intake from food, talk to your health care provider about whether you should take a daily calcium supplement. Limit foods that are high in fat and processed sugars, such as fried and sweet foods. To prevent constipation: Drink enough fluid to keep your urine clear or pale yellow. Eat foods that are high in fiber, such as fresh fruits and vegetables, whole grains, and beans. Activity Exercise only as directed by your health care provider. Most women can continue their usual exercise routine during pregnancy. Try to exercise for 30 minutes at least 5 days a week. Stop exercising if you experience uterine contractions. Avoid heavy lifting, wear low heel shoes, and practice good posture. A sexual relationship may be continued unless your health care provider directs you otherwise. Relieving pain and discomfort Wear a good support bra to prevent discomfort from breast tenderness. Take warm sitz baths to soothe any pain or discomfort caused by hemorrhoids. Use hemorrhoid cream if your health care provider approves. Rest with your legs elevated if you have leg cramps or low back pain. If you develop varicose veins, wear support hose. Elevate your feet for 15 minutes, 3-4 times a day. Limit salt in your diet. Prenatal Care Write down your questions. Take them to your prenatal visits. Keep all your prenatal visits as told by your health  care provider. This is important. Safety Wear your seat belt at all times when driving. Make a list of emergency phone numbers, including numbers for family, friends, the hospital, and police and fire departments. General instructions Ask your health care provider for a referral to a local prenatal education class. Begin classes no later than the beginning of month 6 of your pregnancy. Ask for help if you have counseling or nutritional needs during pregnancy. Your health care provider can offer advice or refer you to specialists for help with various needs. Do not use hot tubs, steam rooms, or saunas. Do not douche or use tampons or scented sanitary pads. Do not cross your legs for long periods of time. Avoid cat litter boxes and soil used by cats. These carry germs that can cause birth defects in the baby and possibly loss of the   fetus by miscarriage or stillbirth. Avoid all smoking, herbs, alcohol, and unprescribed drugs. Chemicals in these products can affect the formation and growth of the baby. Do not use any products that contain nicotine or tobacco, such as cigarettes and e-cigarettes. If you need help quitting, ask your health care provider. Visit your dentist if you have not gone yet during your pregnancy. Use a soft toothbrush to brush your teeth and be gentle when you floss. Contact a health care provider if: You have dizziness. You have mild pelvic cramps, pelvic pressure, or nagging pain in the abdominal area. You have persistent nausea, vomiting, or diarrhea. You have a bad smelling vaginal discharge. You have pain when you urinate. Get help right away if: You have a fever. You are leaking fluid from your vagina. You have spotting or bleeding from your vagina. You have severe abdominal cramping or pain. You have rapid weight gain or weight loss. You have shortness of breath with chest pain. You notice sudden or extreme swelling of your face, hands, ankles, feet, or legs. You  have not felt your baby move in over an hour. You have severe headaches that do not go away when you take medicine. You have vision changes. Summary The second trimester is from week 14 through week 27 (months 4 through 6). It is also a time when the fetus is growing rapidly. Your body goes through many changes during pregnancy. The changes vary from woman to woman. Avoid all smoking, herbs, alcohol, and unprescribed drugs. These chemicals affect the formation and growth your baby. Do not use any tobacco products, such as cigarettes, chewing tobacco, and e-cigarettes. If you need help quitting, ask your health care provider. Contact your health care provider if you have any questions. Keep all prenatal visits as told by your health care provider. This is important. This information is not intended to replace advice given to you by your health care provider. Make sure you discuss any questions you have with your health care provider. Document Released: 07/03/2001 Document Revised: 12/15/2015 Document Reviewed: 09/09/2012 Elsevier Interactive Patient Education  2017 [employer]  

## 2023-02-19 NOTE — Progress Notes (Signed)
INITIAL OBSTETRICAL VISIT Patient name: Melody Stewart MRN [id number]  Date of birth: [birth year] Chief Complaint:   Initial Prenatal Visit  History of Present Illness:   Melody Stewart is a 37 y.o. G47P1001 Caucasian female at [redacted]w[redacted]d by LMP c/w u/s at 11 weeks with an Estimated Date of Delivery: 08/16/23 being seen today for her initial obstetrical visit.   Patient's last menstrual period was 11/09/2022. Her obstetrical history is significant for  term PCS for FTP/failed IOL at 4cm during IOL for severe pre-e .  Wants RCS, does not plan any more pregnancies after this one.  Dep/anx- cymbalta prior to pregnancy, quit w/ +PT, changed jobs, decreased stress, doing well Today she reports no complaints.  Last pap 09/20/20. Results were: NILM w/ HRHPV negative     02/19/2023    3:31 PM 12/06/2021    3:45 PM 09/20/2020   11:38 AM 06/15/2019    8:48 AM 07/24/2018   10:56 AM  Depression screen PHQ 2/9  Decreased Interest 0 2 1 1  0  Down, Depressed, Hopeless 0 1 1 2 1   PHQ - 2 Score 0 3 2 3 1   Altered sleeping 1 3 1 3    Tired, decreased energy 2 3 3 3    Change in appetite 1 3 0 2   Feeling bad or failure about yourself  0 2 0 2   Trouble concentrating 0 2 0 2   Moving slowly or fidgety/restless 0 0 0 0   Suicidal thoughts 0 0 0 0   PHQ-9 Score 4 16 6 15    Difficult doing work/chores    Very difficult         02/19/2023    3:31 PM 12/06/2021    3:45 PM 09/20/2020   11:39 AM  GAD 7 : Generalized Anxiety Score  Nervous, Anxious, on Edge 0 3 1  Control/stop worrying 0 3 0  Worry too much - different things 0 3 1  Trouble relaxing 0 3 0  Restless 0 1 0  Easily annoyed or irritable 0 3 1  Afraid - awful might happen 0 0 0  Total GAD 7 Score 0 16 3     Review of Systems:   Pertinent items are noted in HPI Denies cramping/contractions, leakage of fluid, vaginal bleeding, abnormal vaginal discharge w/ itching/odor/irritation, headaches, visual changes, shortness of breath, chest pain,  abdominal pain, severe nausea/vomiting, or problems with urination or bowel movements unless otherwise stated above.  Pertinent History Reviewed:  Reviewed past medical,surgical, social, obstetrical and family history.  Reviewed problem list, medications and allergies. OB History  Gravida Para Term Preterm AB Living  2 1 1  0 0 1  SAB IAB Ectopic Multiple Live Births  0 0 0 0      # Outcome Date GA Lbr Len/2nd Weight Sex Type Anes PTL Lv  2 Current           1 Term 04/17/18 [redacted]w[redacted]d   F CS-LTranv EPI Y      Complications: Intraamniotic Infection, Failure to Progress in First Stage, Fetal Intolerance, Severe pre-eclampsia   Physical Assessment:   Vitals:   02/19/23 1534  BP: 119/70  Pulse: 80  Weight: 242 lb (109.8 kg)  Body mass index is 39.06 kg/m.       Physical Examination:  General appearance - well appearing, and in no distress  Mental status - alert, oriented to person, place, and time  Psych:  She has a normal mood and affect  Skin - warm and dry, normal color, no suspicious lesions noted  Chest - effort normal, all lung fields clear to auscultation bilaterally  Heart - normal rate and regular rhythm  Abdomen - soft, nontender  Extremities:  No swelling or varicosities noted  Thin prep pap is not done   Chaperone: N/A    TODAY'S FHR via doppler: 148  No results found for this or any previous visit (from the past 24 hour(s)).  Assessment & Plan:  1) Low-Risk Pregnancy G2P1001 at [redacted]w[redacted]d with an Estimated Date of Delivery: 08/16/23   2) Initial OB visit  3) AMA 37yo  4) H/O severe pre-e> ASA 162mg  daily, baseline labs today  5) Prev c/s> wants RCS  6) Dep/anx> no meds, doing well  Meds:  Meds ordered this encounter  Medications   aspirin EC 81 MG tablet    Sig: Take 2 tablets (162 mg total) by mouth daily. Swallow whole.    Dispense:  180 tablet    Refill:  2    Initial labs obtained Continue prenatal vitamins Reviewed n/v relief measures and warning s/s  to report Reviewed recommended weight gain based on pre-gravid BMI Encouraged well-balanced diet Genetic & carrier screening discussed: requests Panorama and AFP, declines Horizon , too late for NT Ultrasound discussed; fetal survey: requested CCNC completed> form faxed if has or is planning to apply for medicaid The nature of [facility] [employer] with multiple MDs and other Advanced Practice Providers was explained to patient; also emphasized that fellows, residents, and students are part of our team. Does have home bp cuff. Office bp cuff given: no. Rx sent: n/a. Check bp weekly, let us know if consistently >140/90.    Follow-up: Return in about 4 weeks (around 03/19/2023) for LROB, JY:NWGNFAO, CNM, in person.   Orders Placed This Encounter  Procedures   Urine Culture   GC/Chlamydia Probe Amp   US OB Comp + 14 Wk   Protein / creatinine ratio, urine   Comp Met (CMET)   CBC/D/Plt+RPR+Rh+ABO+RubIgG...   [facility] PRENATAL TEST    Cheral Marker CNM, [facility] 02/19/2023 4:10 PM

## 2023-02-20 ENCOUNTER — Encounter: Payer: Self-pay | Admitting: Women's Health

## 2023-02-25 ENCOUNTER — Encounter: Payer: Self-pay | Admitting: Women's Health

## 2023-04-01 ENCOUNTER — Ambulatory Visit (INDEPENDENT_AMBULATORY_CARE_PROVIDER_SITE_OTHER): Payer: Medicaid Other | Admitting: Women's Health

## 2023-04-01 ENCOUNTER — Encounter: Payer: Self-pay | Admitting: Women's Health

## 2023-04-01 ENCOUNTER — Ambulatory Visit (INDEPENDENT_AMBULATORY_CARE_PROVIDER_SITE_OTHER): Payer: Medicaid Other

## 2023-04-01 VITALS — BP 109/70 | HR 85 | Wt 250.0 lb

## 2023-04-01 DIAGNOSIS — Z1379 Encounter for other screening for genetic and chromosomal anomalies: Secondary | ICD-10-CM

## 2023-04-01 DIAGNOSIS — Z3480 Encounter for supervision of other normal pregnancy, unspecified trimester: Secondary | ICD-10-CM

## 2023-04-01 DIAGNOSIS — Z3482 Encounter for supervision of other normal pregnancy, second trimester: Secondary | ICD-10-CM | POA: Diagnosis not present

## 2023-04-01 DIAGNOSIS — Z3A20 20 weeks gestation of pregnancy: Secondary | ICD-10-CM | POA: Diagnosis not present

## 2023-04-01 DIAGNOSIS — Z363 Encounter for antenatal screening for malformations: Secondary | ICD-10-CM | POA: Diagnosis not present

## 2023-04-01 NOTE — Patient Instructions (Signed)
Lella, thank you for choosing our office today! We appreciate the opportunity to meet your healthcare needs. You may receive a short survey by mail, e-mail, or through MyChart. If you are happy with your care we would appreciate if you could take just a few minutes to complete the survey questions. We read all of your comments and take your feedback very seriously. Thank you again for choosing our office.  [facility] Team at [facility] ([address] [place], [place] [address]) Entrance C, located off of [address] Free 24/7 valet parking  Go to [email or web address] to register for FREE online childbirth classes  Call the office ([phone]) or go to [facility] if: You begin to severe cramping Your water breaks.  Sometimes it is a big gush of fluid, sometimes it is just a trickle that keeps getting your panties wet or running down your legs You have vaginal bleeding.  It is normal to have a small amount of spotting if your cervix was checked.   [place] [facility] ([facility]): [address], [phone]           [facility]: [address], [phone]                [facility] ([facility]): [address], [phone] (call to ask if accepting patients) [facility]: [address], [place], [phone]    [place] [facility] Doctors [facility] ([facility]): [address], [phone] [facility]: [address], [phone] [facility]: [address], [phone]  [place] [facility]  [facility] ([facility]): [phone] [facility]: [address], [phone]   [place] [facility]: [address], [phone]  [facility]  [facility]: [address], [phone]  Home Blood Pressure Monitoring for Patients   Your provider has recommended that you check your blood pressure (BP) at least once a week at home. If you do not have a blood pressure cuff at home, one will be provided for you. Contact your provider if you have not received your monitor within 1 week.   Helpful Tips for Accurate Home Blood Pressure Checks  Don't smoke, exercise, or drink caffeine 30 minutes before checking your BP Use the restroom before checking your BP (a full bladder can raise your pressure) Relax in a comfortable upright chair Feet on the ground Left arm resting comfortably on a flat surface at the level of your heart Legs uncrossed Back supported Sit quietly and don't talk Place the cuff on your bare arm Adjust snuggly, so that only two fingertips can fit between your skin and the top of the cuff Check 2 readings separated by at least one minute Keep a log of your BP readings For a visual, please reference this diagram: http://[facility].care/bpdiagram  Provider Name: [facility]     Phone: [phone]  Zone 1: ALL CLEAR  Continue to monitor your symptoms:  BP reading is less than 140 (top number) or less than 90 (bottom number)  No right upper stomach pain No headaches or seeing spots No feeling nauseated or throwing up No swelling in face and hands  Zone 2: CAUTION Call your doctor's office for any of the following:  BP reading is greater than 140 (top number) or greater than   90 (bottom number)  Stomach pain under your ribs in the middle or right side Headaches or seeing spots Feeling nauseated or throwing up Swelling in face and hands  Zone 3: EMERGENCY  Seek immediate medical care if you have any of the following:  BP reading is greater than160 (top number) or greater than 110 (bottom number) Severe headaches not improving with Tylenol Serious difficulty catching your breath Any worsening symptoms from  Zone 2     Second Trimester of Pregnancy The second trimester is from week 14 through week 27 (months 4 through 6). The second trimester is often a time when you feel your best. Your body has adjusted to being pregnant, and you begin to feel better physically. Usually, morning sickness has lessened or quit completely, you may have more energy, and you may have an increase in appetite. The second trimester is also a time when the fetus is growing rapidly. At the end of the sixth month, the fetus is about 9 inches long and weighs about 1 pounds. You will likely begin to feel the baby move (quickening) between 16 and 20 weeks of pregnancy. Body changes during your second trimester Your body continues to go through many changes during your second trimester. The changes vary from woman to woman. Your weight will continue to increase. You will notice your lower abdomen bulging out. You may begin to get stretch marks on your hips, abdomen, and breasts. You may develop headaches that can be relieved by medicines. The medicines should be approved by your health care provider. You may urinate more often because the fetus is pressing on your bladder. You may develop or continue to have heartburn as a result of your pregnancy. You may develop constipation because certain hormones are causing the muscles that push waste through your intestines to slow down. You may develop hemorrhoids or swollen, bulging veins (varicose veins). You may have back pain. This is caused by: Weight gain. Pregnancy hormones that are relaxing the joints in your pelvis. A shift in weight and the muscles that support your balance. Your breasts will continue to grow and they will continue to become tender. Your gums may bleed and may be sensitive to brushing and flossing. Dark spots or blotches (chloasma, mask of pregnancy) may develop on your face. This will likely fade after the baby is born. A dark line from your belly button to  the pubic area (linea nigra) may appear. This will likely fade after the baby is born. You may have changes in your hair. These can include thickening of your hair, rapid growth, and changes in texture. Some women also have hair loss during or after pregnancy, or hair that feels dry or thin. Your hair will most likely return to normal after your baby is born.  What to expect at prenatal visits During a routine prenatal visit: You will be weighed to make sure you and the fetus are growing normally. Your blood pressure will be taken. Your abdomen will be measured to track your baby's growth. The fetal heartbeat will be listened to. Any test results from the previous visit will be discussed.  Your health care provider may ask you: How you are feeling. If you are feeling the baby move. If you have had any abnormal symptoms, such as leaking fluid, bleeding, severe headaches, or abdominal cramping. If you are using any tobacco products, including cigarettes, chewing tobacco, and electronic cigarettes. If you have any questions.  Other tests that may be performed during   your second trimester include: Blood tests that check for: Low iron levels (anemia). High blood sugar that affects pregnant women (gestational diabetes) between 24 and 28 weeks. Rh antibodies. This is to check for a protein on red blood cells (Rh factor). Urine tests to check for infections, diabetes, or protein in the urine. An ultrasound to confirm the proper growth and development of the baby. An amniocentesis to check for possible genetic problems. Fetal screens for spina bifida and Down syndrome. HIV (human immunodeficiency virus) testing. Routine prenatal testing includes screening for HIV, unless you choose not to have this test.  Follow these instructions at home: Medicines Follow your health care provider's instructions regarding medicine use. Specific medicines may be either safe or unsafe to take during  pregnancy. Take a prenatal vitamin that contains at least 600 micrograms (mcg) of folic acid. If you develop constipation, try taking a stool softener if your health care provider approves. Eating and drinking Eat a balanced diet that includes fresh fruits and vegetables, whole grains, good sources of protein such as meat, eggs, or tofu, and low-fat dairy. Your health care provider will help you determine the amount of weight gain that is right for you. Avoid raw meat and uncooked cheese. These carry germs that can cause birth defects in the baby. If you have low calcium intake from food, talk to your health care provider about whether you should take a daily calcium supplement. Limit foods that are high in fat and processed sugars, such as fried and sweet foods. To prevent constipation: Drink enough fluid to keep your urine clear or pale yellow. Eat foods that are high in fiber, such as fresh fruits and vegetables, whole grains, and beans. Activity Exercise only as directed by your health care provider. Most women can continue their usual exercise routine during pregnancy. Try to exercise for 30 minutes at least 5 days a week. Stop exercising if you experience uterine contractions. Avoid heavy lifting, wear low heel shoes, and practice good posture. A sexual relationship may be continued unless your health care provider directs you otherwise. Relieving pain and discomfort Wear a good support bra to prevent discomfort from breast tenderness. Take warm sitz baths to soothe any pain or discomfort caused by hemorrhoids. Use hemorrhoid cream if your health care provider approves. Rest with your legs elevated if you have leg cramps or low back pain. If you develop varicose veins, wear support hose. Elevate your feet for 15 minutes, 3-4 times a day. Limit salt in your diet. Prenatal Care Write down your questions. Take them to your prenatal visits. Keep all your prenatal visits as told by your health  care provider. This is important. Safety Wear your seat belt at all times when driving. Make a list of emergency phone numbers, including numbers for family, friends, the hospital, and police and fire departments. General instructions Ask your health care provider for a referral to a local prenatal education class. Begin classes no later than the beginning of month 6 of your pregnancy. Ask for help if you have counseling or nutritional needs during pregnancy. Your health care provider can offer advice or refer you to specialists for help with various needs. Do not use hot tubs, steam rooms, or saunas. Do not douche or use tampons or scented sanitary pads. Do not cross your legs for long periods of time. Avoid cat litter boxes and soil used by cats. These carry germs that can cause birth defects in the baby and possibly loss of the   fetus by miscarriage or stillbirth. Avoid all smoking, herbs, alcohol, and unprescribed drugs. Chemicals in these products can affect the formation and growth of the baby. Do not use any products that contain nicotine or tobacco, such as cigarettes and e-cigarettes. If you need help quitting, ask your health care provider. Visit your dentist if you have not gone yet during your pregnancy. Use a soft toothbrush to brush your teeth and be gentle when you floss. Contact a health care provider if: You have dizziness. You have mild pelvic cramps, pelvic pressure, or nagging pain in the abdominal area. You have persistent nausea, vomiting, or diarrhea. You have a bad smelling vaginal discharge. You have pain when you urinate. Get help right away if: You have a fever. You are leaking fluid from your vagina. You have spotting or bleeding from your vagina. You have severe abdominal cramping or pain. You have rapid weight gain or weight loss. You have shortness of breath with chest pain. You notice sudden or extreme swelling of your face, hands, ankles, feet, or legs. You  have not felt your baby move in over an hour. You have severe headaches that do not go away when you take medicine. You have vision changes. Summary The second trimester is from week 14 through week 27 (months 4 through 6). It is also a time when the fetus is growing rapidly. Your body goes through many changes during pregnancy. The changes vary from woman to woman. Avoid all smoking, herbs, alcohol, and unprescribed drugs. These chemicals affect the formation and growth your baby. Do not use any tobacco products, such as cigarettes, chewing tobacco, and e-cigarettes. If you need help quitting, ask your health care provider. Contact your health care provider if you have any questions. Keep all prenatal visits as told by your health care provider. This is important. This information is not intended to replace advice given to you by your health care provider. Make sure you discuss any questions you have with your health care provider. Document Released: 07/03/2001 Document Revised: 12/15/2015 Document Reviewed: 09/09/2012 Elsevier Interactive Patient Education  2017 [employer]  

## 2023-04-01 NOTE — Progress Notes (Signed)
[place] 20+3 wks,breech,anterior fundal placenta gr 0, cx 6.2 cm,normal ovaries,SVP of fluid 6 cm.,EFW 324 g 23%,anatomy complete,no obvious abnormalities

## 2023-04-01 NOTE — Progress Notes (Signed)
LOW-RISK PREGNANCY VISIT Patient name: Melody Stewart MRN [id number]  Date of birth: Jul 18, 1986 Chief Complaint:   Routine Prenatal Visit (Anatomy scan today)  History of Present Illness:   Melody Stewart is a 37 y.o. G16P1001 female at [redacted]w[redacted]d with an Estimated Date of Delivery: 08/16/23 being seen today for ongoing management of a low-risk pregnancy.   Today she reports pelvic pressure. Contractions: Not present. Vag. Bleeding: None.  Movement: Present. denies leaking of fluid.     02/19/2023    3:31 PM 12/06/2021    3:45 PM 09/20/2020   11:38 AM 06/15/2019    8:48 AM 07/24/2018   10:56 AM  Depression screen PHQ 2/9  Decreased Interest 0 2 1 1  0  Down, Depressed, Hopeless 0 1 1 2 1   PHQ - 2 Score 0 3 2 3 1   Altered sleeping 1 3 1 3    Tired, decreased energy 2 3 3 3    Change in appetite 1 3 0 2   Feeling bad or failure about yourself  0 2 0 2   Trouble concentrating 0 2 0 2   Moving slowly or fidgety/restless 0 0 0 0   Suicidal thoughts 0 0 0 0   PHQ-9 Score 4 16 6 15    Difficult doing work/chores    Very difficult         02/19/2023    3:31 PM 12/06/2021    3:45 PM 09/20/2020   11:39 AM  GAD 7 : Generalized Anxiety Score  Nervous, Anxious, on Edge 0 3 1  Control/stop worrying 0 3 0  Worry too much - different things 0 3 1  Trouble relaxing 0 3 0  Restless 0 1 0  Easily annoyed or irritable 0 3 1  Afraid - awful might happen 0 0 0  Total GAD 7 Score 0 16 3      Review of Systems:   Pertinent items are noted in HPI Denies abnormal vaginal discharge w/ itching/odor/irritation, headaches, visual changes, shortness of breath, chest pain, abdominal pain, severe nausea/vomiting, or problems with urination or bowel movements unless otherwise stated above. Pertinent History Reviewed:  Reviewed past medical,surgical, social, obstetrical and family history.  Reviewed problem list, medications and allergies. Physical Assessment:   Vitals:   04/01/23 0922  BP: 109/70  Pulse:  85  Weight: 250 lb (113.4 kg)  Body mass index is 40.35 kg/m.        Physical Examination:   General appearance: Well appearing, and in no distress  Mental status: Alert, oriented to person, place, and time  Skin: Warm & dry  Cardiovascular: Normal heart rate noted  Respiratory: Normal respiratory effort, no distress  Abdomen: Soft, gravid, nontender  Pelvic: Cervical exam deferred         Extremities: Edema: None  Fetal Status:     Movement: Present  Melody Stewart 20+3 wks,breech,anterior fundal placenta gr 0, cx 6.2 cm,normal ovaries,SVP of fluid 6 cm,EFW 324 g 23%,anatomy complete,no obvious abnormalities   Chaperone: N/A   No results found for this or any previous visit (from the past 24 hour(s)).  Assessment & Plan:  1) Low-risk pregnancy G2P1001 at [redacted]w[redacted]d with an Estimated Date of Delivery: 08/16/23   2) Pelvic pressure, discussed belt/tape  3) Prev c/s> wants RCS  4) H/O severe pre-e> ASA   Meds: No orders of the defined types were placed in this encounter.  Labs/procedures today: U/S and AFP  Plan:  Continue routine obstetrical care  Next visit: prefers  in person    Reviewed: Preterm labor symptoms and general obstetric precautions including but not limited to vaginal bleeding, contractions, leaking of fluid and fetal movement were reviewed in detail with the patient.  All questions were answered. Does have home bp cuff. Office bp cuff given: not applicable. Check bp weekly, let Melody Stewart know if consistently >140 and/or >90.  Follow-up: Return in about 4 weeks (around 04/29/2023) for LROB, CNM, in person.  Future Appointments  Date Time Provider Department Center  05/01/2023  8:30 AM Arabella Merles, CNM [facility]     Orders Placed This Encounter  Procedures   AFP, Serum, Open Spina Bifida   Cheral Marker CNM, [facility] 04/01/2023 10:04 AM

## 2023-04-04 LAB — AFP, SERUM, OPEN SPINA BIFIDA
AFP MoM: 0.96
AFP Value: 39.6 ng/mL
Gest. Age on Collection Date: 20 wk
Maternal Age At EDD: 38.5 a
OSBR Risk 1 IN: 10000
Test Results:: NEGATIVE
Weight: 250 [lb_av]

## 2023-05-01 ENCOUNTER — Ambulatory Visit (INDEPENDENT_AMBULATORY_CARE_PROVIDER_SITE_OTHER): Payer: Medicaid Other | Admitting: Advanced Practice Midwife

## 2023-05-01 ENCOUNTER — Encounter: Payer: Self-pay | Admitting: Advanced Practice Midwife

## 2023-05-01 VITALS — BP 115/68 | HR 94 | Wt 261.0 lb

## 2023-05-01 DIAGNOSIS — Z3A24 24 weeks gestation of pregnancy: Secondary | ICD-10-CM

## 2023-05-01 DIAGNOSIS — Z3480 Encounter for supervision of other normal pregnancy, unspecified trimester: Secondary | ICD-10-CM

## 2023-05-01 DIAGNOSIS — Z3482 Encounter for supervision of other normal pregnancy, second trimester: Secondary | ICD-10-CM

## 2023-05-01 NOTE — Patient Instructions (Signed)
Melody Stewart, I greatly value your feedback.  If you receive a survey following your visit with [place] today, we appreciate you taking the time to fill it out.  Thanks, Philipp Deputy, CNM   You will have your sugar test next visit.  Please do not eat or drink anything after midnight the night before you come, not even water.  You will be here for at least two hours.  Please make an appointment online for the bloodwork at [email or web address] for 8:30am (or as close to this as possible). Make sure you select the [facility] service center. The day of the appointment, check in with our office first, then you will go to [facility] to start the sugar test.    [facility] HAS MOVED!!! It is now [facility] at [facility] ([address] [place], [place] [address]) Entrance C, located off of [address] valet parking  Go to [email or web address] to register for FREE online childbirth classes   Call the office [phone]) or go to [facility] if: You begin to have strong, frequent contractions Your water breaks.  Sometimes it is a big gush of fluid, sometimes it is just a trickle that keeps getting your panties wet or running down your legs You have vaginal bleeding.  It is normal to have a small amount of spotting if your cervix was checked.  You don't feel your baby moving like normal.  If you don't, get you something to eat and drink and lay down and focus on feeling your baby move.   If your baby is still not moving like normal, you should call the office or go to [facility].  [place] Pediatricians/Family Doctors: [facility] [phone]           [facility] [phone]                [facility] [phone] (usually not accepting new patients unless you have family there already, you are always welcome to call and ask)      [facility] Department [phone]       [facility] Pediatricians/Family Doctors:  [facility]: [phone] [facility]: [phone] [facility]: [phone]  [facility] Doctors:  [facility]: [phone]  [facility]: [phone]  [facility] Doctors: [facility]: [phone]   Home Blood Pressure Monitoring for Patients   Your provider has recommended that you check your blood pressure (BP) at least once a week at home. If you do not have a blood pressure cuff at home, one will be provided for you. Contact your provider if you have not received your monitor within 1 week.   Helpful Tips for Accurate Home Blood Pressure Checks  Don't smoke, exercise, or drink caffeine 30 minutes before checking your BP Use the restroom before checking your BP (a full bladder can raise your pressure) Relax in a comfortable upright chair Feet on the ground Left arm resting comfortably on a flat surface at the level of your heart Legs uncrossed Back supported Sit quietly and don't talk Place the cuff on your bare arm Adjust snuggly, so that only two fingertips can fit between your skin and the top of the cuff Check 2 readings separated by at least one minute Keep a log of your BP readings For a visual, please reference this diagram: http://ccnc.care/bpdiagram  Provider Name: [facility]     Phone: [phone]  Zone 1: ALL CLEAR  Continue to monitor your symptoms:  BP reading is less than 140 (top number) or less than 90 (bottom number)  No right upper stomach pain No headaches or seeing spots No feeling nauseated or throwing up No swelling in face and hands  Zone 2: CAUTION Call your doctor's office for any of the following:  BP reading is greater than 140 (top number) or greater than 90 (bottom number)  Stomach pain under your ribs in the middle or right side Headaches or seeing spots Feeling nauseated or throwing up Swelling in face and hands  Zone 3: EMERGENCY  Seek  immediate medical care if you have any of the following:  BP reading is greater than160 (top number) or greater than 110 (bottom number) Severe headaches not improving with Tylenol Serious difficulty catching your breath Any worsening symptoms from Zone 2   Second Trimester of Pregnancy The second trimester is from week 13 through week 28, months 4 through 6. The second trimester is often a time when you feel your best. Your body has also adjusted to being pregnant, and you begin to feel better physically. Usually, morning sickness has lessened or quit completely, you may have more energy, and you may have an increase in appetite. The second trimester is also a time when the fetus is growing rapidly. At the end of the sixth month, the fetus is about 9 inches long and weighs about 1 pounds. You will likely begin to feel the baby move (quickening) between 18 and 20 weeks of the pregnancy. BODY CHANGES Your body goes through many changes during pregnancy. The changes vary from woman to woman.  Your weight will continue to increase. You will notice your lower abdomen bulging out. You may begin to get stretch marks on your hips, abdomen, and breasts. You may develop headaches that can be relieved by medicines approved by your health care provider. You may urinate more often because the fetus is pressing on your bladder. You may develop or continue to have heartburn as a result of your pregnancy. You may develop constipation because certain hormones are causing the muscles that push waste through your intestines to slow down. You may develop hemorrhoids or swollen, bulging veins (varicose veins). You may have back pain because of the weight gain and pregnancy hormones relaxing your joints between the bones in your pelvis and as a result of a shift in weight and the muscles that support your balance. Your breasts will continue to grow and be tender. Your gums may bleed and may be sensitive to brushing  and flossing. Dark spots or blotches (chloasma, mask of pregnancy) may develop on your face. This will likely fade after the baby is born. A dark line from your belly button to the pubic area (linea nigra) may appear. This will likely fade after the baby is born. You may have changes in your hair. These can include thickening of your hair, rapid growth, and changes in texture. Some women also have hair loss during or after pregnancy, or hair that feels dry or thin. Your hair will most likely return to normal after your baby is born. WHAT TO EXPECT AT YOUR PRENATAL VISITS During a routine prenatal visit: You will be weighed to make sure you and the fetus are growing normally. Your blood pressure will be taken. Your abdomen will be measured to track your baby's growth. The fetal heartbeat will be listened to. Any test results from the previous visit will be discussed. Your health care provider  may ask you: How you are feeling. If you are feeling the baby move. If you have had any abnormal symptoms, such as leaking fluid, bleeding, severe headaches, or abdominal cramping. If you have any questions. Other tests that may be performed during your second trimester include: Blood tests that check for: Low iron levels (anemia). Gestational diabetes (between 24 and 28 weeks). Rh antibodies. Urine tests to check for infections, diabetes, or protein in the urine. An ultrasound to confirm the proper growth and development of the baby. An amniocentesis to check for possible genetic problems. Fetal screens for spina bifida and Down syndrome. HOME CARE INSTRUCTIONS  Avoid all smoking, herbs, alcohol, and unprescribed drugs. These chemicals affect the formation and growth of the baby. Follow your health care provider's instructions regarding medicine use. There are medicines that are either safe or unsafe to take during pregnancy. Exercise only as directed by your health care provider. Experiencing  uterine cramps is a good sign to stop exercising. Continue to eat regular, healthy meals. Wear a good support bra for breast tenderness. Do not use hot tubs, steam rooms, or saunas. Wear your seat belt at all times when driving. Avoid raw meat, uncooked cheese, cat litter boxes, and soil used by cats. These carry germs that can cause birth defects in the baby. Take your prenatal vitamins. Try taking a stool softener (if your health care provider approves) if you develop constipation. Eat more high-fiber foods, such as fresh vegetables or fruit and whole grains. Drink plenty of fluids to keep your urine clear or pale yellow. Take warm sitz baths to soothe any pain or discomfort caused by hemorrhoids. Use hemorrhoid cream if your health care provider approves. If you develop varicose veins, wear support hose. Elevate your feet for 15 minutes, 3-4 times a day. Limit salt in your diet. Avoid heavy lifting, wear low heel shoes, and practice good posture. Rest with your legs elevated if you have leg cramps or low back pain. Visit your dentist if you have not gone yet during your pregnancy. Use a soft toothbrush to brush your teeth and be gentle when you floss. A sexual relationship may be continued unless your health care provider directs you otherwise. Continue to go to all your prenatal visits as directed by your health care provider. SEEK MEDICAL CARE IF:  You have dizziness. You have mild pelvic cramps, pelvic pressure, or nagging pain in the abdominal area. You have persistent nausea, vomiting, or diarrhea. You have a bad smelling vaginal discharge. You have pain with urination. SEEK IMMEDIATE MEDICAL CARE IF:  You have a fever. You are leaking fluid from your vagina. You have spotting or bleeding from your vagina. You have severe abdominal cramping or pain. You have rapid weight gain or loss. You have shortness of breath with chest pain. You notice sudden or extreme swelling of your face,  hands, ankles, feet, or legs. You have not felt your baby move in over an hour. You have severe headaches that do not go away with medicine. You have vision changes. Document Released: 07/03/2001 Document Revised: 07/14/2013 Document Reviewed: 09/09/2012 [facility] Patient Information 2015 [place], [place]. This information is not intended to replace advice given to you by your health care provider. Make sure you discuss any questions you have with your health care provider.

## 2023-05-01 NOTE — Progress Notes (Signed)
LOW-RISK PREGNANCY VISIT Patient name: Melody Stewart MRN [id number]  Date of birth: 1985/12/24 Chief Complaint:   Routine Prenatal Visit  History of Present Illness:   Melody Stewart is a 37 y.o. G73P1001 female at [redacted]w[redacted]d with an Estimated Date of Delivery: 08/16/23 being seen today for ongoing management of a low-risk pregnancy.  Today she reports  vag itching/irritation . Contractions: Not present. Vag. Bleeding: None.  Movement: Present. denies leaking of fluid. Review of Systems:   Pertinent items are noted in HPI Denies abnormal vaginal discharge w/ itching/odor/irritation, headaches, visual changes, shortness of breath, chest pain, abdominal pain, severe nausea/vomiting, or problems with urination or bowel movements unless otherwise stated above. Pertinent History Reviewed:  Reviewed past medical,surgical, social, obstetrical and family history.  Reviewed problem list, medications and allergies. Physical Assessment:   Vitals:   05/01/23 0835  BP: 115/68  Pulse: 94  Weight: 261 lb (118.4 kg)  Body mass index is 42.13 kg/m.        Physical Examination:   General appearance: Well appearing, and in no distress  Mental status: Alert, oriented to person, place, and time  Skin: Warm & dry  Cardiovascular: Normal heart rate noted  Respiratory: Normal respiratory effort, no distress  Abdomen: Soft, gravid, nontender  Pelvic: Cervical exam deferred         Extremities: Edema: None  Fetal Status: Fetal Heart Rate (bpm): 149 Fundal Height: 26 cm Movement: Present    No results found for this or any previous visit (from the past 24 hour(s)).  Assessment & Plan:  1) Low-risk pregnancy G2P1001 at [redacted]w[redacted]d with an Estimated Date of Delivery: 08/16/23   2) Hx severe pre-e, taking bASA  3) Recurrent vag itching/irritation, is in the middle of a Monistat 7 course; with previous itching has stopped cream at day 3; encouraged to finish full course this time, and we can discuss suppressive  therapy if it reoccurs  4) Prev C/S, still leaning towards rLTCS and with possible IUD placement   Meds: No orders of the defined types were placed in this encounter.  Labs/procedures today: none  Plan:  Continue routine obstetrical care   Reviewed: Preterm labor symptoms and general obstetric precautions including but not limited to vaginal bleeding, contractions, leaking of fluid and fetal movement were reviewed in detail with the patient.  All questions were answered. Has home bp cuff. Check bp weekly, let us know if >140/90.   Follow-up: Return in about 3 weeks (around 05/22/2023) for LROB, PN2.  No orders of the defined types were placed in this encounter.  Arabella Merles CNM 05/01/2023 9:07 AM

## 2023-05-22 ENCOUNTER — Encounter: Payer: Self-pay | Admitting: Obstetrics & Gynecology

## 2023-05-22 ENCOUNTER — Ambulatory Visit (INDEPENDENT_AMBULATORY_CARE_PROVIDER_SITE_OTHER): Payer: Medicaid Other | Admitting: Obstetrics & Gynecology

## 2023-05-22 ENCOUNTER — Other Ambulatory Visit: Payer: Medicaid Other

## 2023-05-22 ENCOUNTER — Other Ambulatory Visit (HOSPITAL_COMMUNITY)
Admission: RE | Admit: 2023-05-22 | Discharge: 2023-05-22 | Disposition: A | Discharge status: Home / Self Care | Payer: Medicaid Other | Source: Ambulatory Visit | Attending: Obstetrics & Gynecology | Admitting: Obstetrics & Gynecology

## 2023-05-22 VITALS — BP 128/74 | HR 102 | Wt 268.0 lb

## 2023-05-22 DIAGNOSIS — Z3A27 27 weeks gestation of pregnancy: Secondary | ICD-10-CM | POA: Diagnosis present

## 2023-05-22 DIAGNOSIS — Z23 Encounter for immunization: Secondary | ICD-10-CM

## 2023-05-22 DIAGNOSIS — Z131 Encounter for screening for diabetes mellitus: Secondary | ICD-10-CM

## 2023-05-22 DIAGNOSIS — Z3480 Encounter for supervision of other normal pregnancy, unspecified trimester: Secondary | ICD-10-CM

## 2023-05-22 DIAGNOSIS — N898 Other specified noninflammatory disorders of vagina: Secondary | ICD-10-CM

## 2023-05-22 DIAGNOSIS — Z3482 Encounter for supervision of other normal pregnancy, second trimester: Secondary | ICD-10-CM | POA: Insufficient documentation

## 2023-05-22 NOTE — Addendum Note (Signed)
Addended by: Freddie Apley R on: 05/22/2023 12:11 PM   Modules accepted: Orders

## 2023-05-22 NOTE — Progress Notes (Signed)
LOW-RISK PREGNANCY VISIT Patient name: Melody Stewart MRN [id number]  Date of birth: Jul 01, 1986 Chief Complaint:   Routine Prenatal Visit  History of Present Illness:   Melody Stewart is a 37 y.o. G62P1001 female at [redacted]w[redacted]d with an Estimated Date of Delivery: 08/16/23 being seen today for ongoing management of a low-risk pregnancy.   -Prior C-section, desires repeat     02/19/2023    3:31 PM 12/06/2021    3:45 PM 09/20/2020   11:38 AM 06/15/2019    8:48 AM 07/24/2018   10:56 AM  Depression screen PHQ 2/9  Decreased Interest 0 2 1 1  0  Down, Depressed, Hopeless 0 1 1 2 1   PHQ - 2 Score 0 3 2 3 1   Altered sleeping 1 3 1 3    Tired, decreased energy 2 3 3 3    Change in appetite 1 3 0 2   Feeling bad or failure about yourself  0 2 0 2   Trouble concentrating 0 2 0 2   Moving slowly or fidgety/restless 0 0 0 0   Suicidal thoughts 0 0 0 0   PHQ-9 Score 4 16 6 15    Difficult doing work/chores    Very difficult     Today she reports  occasional vaginal discharge- recently used OTC yeast treatment- seemed to make it worse and now finally starting to feel better.  Some itching, no odor . Contractions: Not present. Vag. Bleeding: None.  Movement: Present. denies leaking of fluid. Review of Systems:   Pertinent items are noted in HPI Denies headaches, visual changes, shortness of breath, chest pain, abdominal pain, severe nausea/vomiting, or problems with urination or bowel movements unless otherwise stated above. Pertinent History Reviewed:  Reviewed past medical,surgical, social, obstetrical and family history.  Reviewed problem list, medications and allergies.  Physical Assessment:   Vitals:   05/22/23 0854  BP: 128/74  Pulse: (!) 102  Weight: 268 lb (121.6 kg)  Body mass index is 43.26 kg/m.        Physical Examination:   General appearance: Well appearing, and in no distress  Mental status: Alert, oriented to person, place, and time  Skin: Warm & dry  Respiratory: Normal  respiratory effort, no distress  Abdomen: Soft, gravid, nontender  Pelvic:  vaginal swab obtained, minimal white discharge noted          Extremities:  no edema  Psych:  mood and affect appropriate  Fetal Status: Fetal Heart Rate (bpm): 150 Fundal Height: 27 cm Movement: Present    Chaperone:  Sherri Kaywood     No results found for this or any previous visit (from the past 24 hour(s)).   Assessment & Plan:  1) Low-risk pregnancy G2P1001 at [redacted]w[redacted]d with an Estimated Date of Delivery: 08/16/23   2) Vaginal discharge- vaginitis panel obtained, further management pending results  3) Prior C-section, plan for repeat   Meds: No orders of the defined types were placed in this encounter.  Labs/procedures today: PN2, vaginitis panel, flu shot today  Plan:  Continue routine obstetrical care  Next visit: prefers in person    Reviewed: Preterm labor symptoms and general obstetric precautions including but not limited to vaginal bleeding, contractions, leaking of fluid and fetal movement were reviewed in detail with the patient.  All questions were answered. Pt has home bp cuff. Check bp weekly, let us know if >140/90.   Follow-up: Return in about 3 weeks (around 06/12/2023) for LROB visit.  Orders Placed This Encounter  Procedures   Flu vaccine trivalent PF, 6mos and older(Flulaval,Afluria,Fluarix,Fluzone)    Myna Hidalgo, DO Attending Obstetrician & Gynecologist, [facility] for [employer], [facility]

## 2023-05-23 LAB — CBC
Hematocrit: 36.7 % (ref 34.0–46.6)
Hemoglobin: 12.1 g/dL (ref 11.1–15.9)
MCH: 29.7 pg (ref 26.6–33.0)
MCHC: 33 g/dL (ref 31.5–35.7)
MCV: 90 fL (ref 79–97)
Platelets: 304 10*3/uL (ref 150–450)
RBC: 4.08 x10E6/uL (ref 3.77–5.28)
RDW: 13.1 % (ref 11.7–15.4)
WBC: 10.4 10*3/uL (ref 3.4–10.8)

## 2023-05-23 LAB — HIV ANTIBODY (ROUTINE TESTING W REFLEX): HIV Screen 4th Generation wRfx: NONREACTIVE

## 2023-05-23 LAB — CERVICOVAGINAL ANCILLARY ONLY
Bacterial Vaginitis (gardnerella): NEGATIVE
Candida Glabrata: NEGATIVE
Candida Vaginitis: NEGATIVE
Comment: NEGATIVE
Comment: NEGATIVE
Comment: NEGATIVE

## 2023-05-23 LAB — GLUCOSE TOLERANCE, 2 HOURS W/ 1HR
Glucose, 1 hour: 140 mg/dL (ref 70–179)
Glucose, 2 hour: 118 mg/dL (ref 70–152)
Glucose, Fasting: 91 mg/dL (ref 70–91)

## 2023-05-23 LAB — RPR: RPR Ser Ql: NONREACTIVE

## 2023-05-23 LAB — ANTIBODY SCREEN: Antibody Screen: NEGATIVE

## 2023-06-12 ENCOUNTER — Encounter: Payer: Self-pay | Admitting: Women's Health

## 2023-06-12 ENCOUNTER — Ambulatory Visit: Payer: Medicaid Other | Admitting: Women's Health

## 2023-06-12 VITALS — BP 118/66 | HR 98 | Wt 269.5 lb

## 2023-06-12 DIAGNOSIS — Z3483 Encounter for supervision of other normal pregnancy, third trimester: Secondary | ICD-10-CM

## 2023-06-12 DIAGNOSIS — Z3480 Encounter for supervision of other normal pregnancy, unspecified trimester: Secondary | ICD-10-CM

## 2023-06-12 DIAGNOSIS — Z23 Encounter for immunization: Secondary | ICD-10-CM | POA: Diagnosis not present

## 2023-06-12 DIAGNOSIS — Z3A30 30 weeks gestation of pregnancy: Secondary | ICD-10-CM

## 2023-06-12 NOTE — Progress Notes (Signed)
LOW-RISK PREGNANCY VISIT Patient name: Melody Stewart MRN [id number]  Date of birth: 1986/06/25 Chief Complaint:   Routine Prenatal Visit  History of Present Illness:   Melody Stewart is a 37 y.o. G2P1001 female at [redacted]w[redacted]d with an Estimated Date of Delivery: 08/16/23 being seen today for ongoing management of a low-risk pregnancy.   Today she reports no complaints. Contractions: Not present. Vag. Bleeding: None.  Movement: Present. denies leaking of fluid.     02/19/2023    3:31 PM 12/06/2021    3:45 PM 09/20/2020   11:38 AM 06/15/2019    8:48 AM 07/24/2018   10:56 AM  Depression screen PHQ 2/9  Decreased Interest 0 2 1 1  0  Down, Depressed, Hopeless 0 1 1 2 1   PHQ - 2 Score 0 3 2 3 1   Altered sleeping 1 3 1 3    Tired, decreased energy 2 3 3 3    Change in appetite 1 3 0 2   Feeling bad or failure about yourself  0 2 0 2   Trouble concentrating 0 2 0 2   Moving slowly or fidgety/restless 0 0 0 0   Suicidal thoughts 0 0 0 0   PHQ-9 Score 4 16 6 15    Difficult doing work/chores    Very difficult         02/19/2023    3:31 PM 12/06/2021    3:45 PM 09/20/2020   11:39 AM  GAD 7 : Generalized Anxiety Score  Nervous, Anxious, on Edge 0 3 1  Control/stop worrying 0 3 0  Worry too much - different things 0 3 1  Trouble relaxing 0 3 0  Restless 0 1 0  Easily annoyed or irritable 0 3 1  Afraid - awful might happen 0 0 0  Total GAD 7 Score 0 16 3      Review of Systems:   Pertinent items are noted in HPI Denies abnormal vaginal discharge w/ itching/odor/irritation, headaches, visual changes, shortness of breath, chest pain, abdominal pain, severe nausea/vomiting, or problems with urination or bowel movements unless otherwise stated above. Pertinent History Reviewed:  Reviewed past medical,surgical, social, obstetrical and family history.  Reviewed problem list, medications and allergies. Physical Assessment:   Vitals:   06/12/23 1609  BP: 118/66  Pulse: 98  Weight: 269 lb 8 oz  (122.2 kg)  Body mass index is 43.5 kg/m.        Physical Examination:   General appearance: Well appearing, and in no distress  Mental status: Alert, oriented to person, place, and time  Skin: Warm & dry  Cardiovascular: Normal heart rate noted  Respiratory: Normal respiratory effort, no distress  Abdomen: Soft, gravid, nontender  Pelvic: Cervical exam deferred         Extremities: Edema: Trace  Fetal Status: Fetal Heart Rate (bpm): 140 Fundal Height: 31 cm Movement: Present    Chaperone: N/A   No results found for this or any previous visit (from the past 24 hour(s)).  Assessment & Plan:  1) Low-risk pregnancy G2P1001 at [redacted]w[redacted]d with an Estimated Date of Delivery: 08/16/23   2) Prev c/s, for RCS  3) H/O severe pre-e> bp great, reviewed pre-e s/s, reasons to seek care   Meds: No orders of the defined types were placed in this encounter.  Labs/procedures today: Tdap  Plan:  Continue routine obstetrical care  Next visit: prefers in person    Reviewed: Preterm labor symptoms and general obstetric precautions including but not limited  to vaginal bleeding, contractions, leaking of fluid and fetal movement were reviewed in detail with the patient.  All questions were answered. Does have home bp cuff. Office bp cuff given: not applicable. Check bp weekly, let us know if consistently >140 and/or >90.  Follow-up: Return for As scheduled; make 12/18 appt w/ MD only to schedule c/s.  Future Appointments  Date Time Provider Department Center  06/26/2023  3:50 PM Arabella Merles, CNM [facility]  07/10/2023  4:10 PM Cheral Marker, CNM [facility]  07/22/2023  4:10 PM Cheral Marker, CNM [facility]  07/29/2023  4:10 PM Lazaro Arms, MD [facility]  08/05/2023  4:10 PM Cheral Marker, CNM [facility]  08/12/2023  4:10 PM Cheral Marker, CNM [facility]    No orders of the defined types were placed in this encounter.  Cheral Marker CNM,  [facility] 06/12/2023 4:26 PM

## 2023-06-12 NOTE — Patient Instructions (Signed)
Melody Stewart, thank you for choosing our office today! We appreciate the opportunity to meet your healthcare needs. You may receive a short survey by mail, e-mail, or through MyChart. If you are happy with your care we would appreciate if you could take just a few minutes to complete the survey questions. We read all of your comments and take your feedback very seriously. Thank you again for choosing our office.  °[facility] ° °[facility] °([address] [place], [place] [address]) °Entrance C, located off of [address] °Free 24/7 valet parking  ° °CLASSES: Go to [email or web address] to register for classes (childbirth, breastfeeding, waterbirth, infant CPR, daddy bootcamp, etc.) ° °Call the office ([phone]) or go to [facility] if: °You begin to have strong, frequent contractions °Your water breaks.  Sometimes it is a big gush of fluid, sometimes it is just a trickle that keeps getting your panties wet or running down your legs °You have vaginal bleeding.  It is normal to have a small amount of spotting if your cervix was checked.  °You don't feel your baby moving like normal.  If you don't, get you something to eat and drink and lay down and focus on feeling your baby move.   If your baby is still not moving like normal, you should call the office or go to [facility]. ° °Call the office ([phone]) or go to [facility] for these signs of pre-eclampsia: °Severe headache that does not go away with Tylenol °Visual changes- seeing spots, double, blurred vision °Pain under your right breast or upper abdomen that does not go away with Tums or heartburn medicine °Nausea and/or vomiting °Severe swelling in your hands, feet, and face  ° °Tdap Vaccine °It is recommended that you get the Tdap vaccine during the third trimester of EACH pregnancy to help protect your baby from getting pertussis (whooping cough) °27-36 weeks is the BEST time to do  this so that you can pass the protection on to your baby. During pregnancy is better than after pregnancy, but if you are unable to get it during pregnancy it will be offered at the hospital.  °You can get this vaccine with us, at the health department, your family doctor, or some local pharmacies °Everyone who will be around your baby should also be up-to-date on their vaccines before the baby comes. Adults (who are not pregnant) only need 1 dose of Tdap during adulthood.  ° °[place] [facility] °[facility] ([facility]): [address], [phone]           °[facility]: [address], [phone]                °[facility] ([facility]): [address], [phone] (call to ask if accepting patients) °[facility]: [address], [place], [phone]   ° °[place] [facility]/Family Doctors °[facility] ([facility]): [address], [phone] °[facility]: [address], [phone] °[facility]: [address], [phone] ° °[place]  °[facility] ([facility]): [phone] °[facility]: [address], [phone]  ° °[place] [facility] °[facility]: [address], [phone] ° °[facility]  °[facility]: [address], [phone] ° °Home Blood Pressure Monitoring for Patients  ° °Your provider has recommended that you check your   blood pressure (BP) at least once a week at home. If you do not have a blood pressure cuff at home, one will be provided for you. Contact your provider if you have not received your monitor within 1 week.   Helpful Tips for Accurate Home Blood Pressure Checks  Don't smoke, exercise, or drink caffeine 30 minutes before checking your BP Use the restroom before checking your BP (a full bladder can raise your  pressure) Relax in a comfortable upright chair Feet on the ground Left arm resting comfortably on a flat surface at the level of your heart Legs uncrossed Back supported Sit quietly and don't talk Place the cuff on your bare arm Adjust snuggly, so that only two fingertips can fit between your skin and the top of the cuff Check 2 readings separated by at least one minute Keep a log of your BP readings For a visual, please reference this diagram: http://ccnc.care/bpdiagram  Provider Name: [facility]     Phone: [phone]  Zone 1: ALL CLEAR  Continue to monitor your symptoms:  BP reading is less than 140 (top number) or less than 90 (bottom number)  No right upper stomach pain No headaches or seeing spots No feeling nauseated or throwing up No swelling in face and hands  Zone 2: CAUTION Call your doctor's office for any of the following:  BP reading is greater than 140 (top number) or greater than 90 (bottom number)  Stomach pain under your ribs in the middle or right side Headaches or seeing spots Feeling nauseated or throwing up Swelling in face and hands  Zone 3: EMERGENCY  Seek immediate medical care if you have any of the following:  BP reading is greater than160 (top number) or greater than 110 (bottom number) Severe headaches not improving with Tylenol Serious difficulty catching your breath Any worsening symptoms from Zone 2  Preterm Labor and Birth Information  The normal length of a pregnancy is 39-41 weeks. Preterm labor is when labor starts before 37 completed weeks of pregnancy. What are the risk factors for preterm labor? Preterm labor is more likely to occur in women who: Have certain infections during pregnancy such as a bladder infection, sexually transmitted infection, or infection inside the uterus (chorioamnionitis). Have a shorter-than-normal cervix. Have gone into preterm labor before. Have had surgery on their cervix. Are younger than age 17  or older than age 35. Are African American. Are pregnant with twins or multiple babies (multiple gestation). Take street drugs or smoke while pregnant. Do not gain enough weight while pregnant. Became pregnant shortly after having been pregnant. What are the symptoms of preterm labor? Symptoms of preterm labor include: Cramps similar to those that can happen during a menstrual period. The cramps may happen with diarrhea. Pain in the abdomen or lower back. Regular uterine contractions that may feel like tightening of the abdomen. A feeling of increased pressure in the pelvis. Increased watery or bloody mucus discharge from the vagina. Water breaking (ruptured amniotic sac). Why is it important to recognize signs of preterm labor? It is important to recognize signs of preterm labor because babies who are born prematurely may not be fully developed. This can put them at an increased risk for: Long-term (chronic) heart and lung problems. Difficulty immediately after birth with regulating body systems, including blood sugar, body temperature, heart rate, and breathing rate. Bleeding in the brain. Cerebral palsy. Learning difficulties. Death. These risks are highest for babies who are born before 34 weeks   of pregnancy. How is preterm labor treated? Treatment depends on the length of your pregnancy, your condition, and the health of your baby. It may involve: Having a stitch (suture) placed in your cervix to prevent your cervix from opening too early (cerclage). Taking or being given medicines, such as: Hormone medicines. These may be given early in pregnancy to help support the pregnancy. Medicine to stop contractions. Medicines to help mature the baby's lungs. These may be prescribed if the risk of delivery is high. Medicines to prevent your baby from developing cerebral palsy. If the labor happens before 34 weeks of pregnancy, you may need to stay in the hospital. What should I do if I  think I am in preterm labor? If you think that you are going into preterm labor, call your health care provider right away. How can I prevent preterm labor in future pregnancies? To increase your chance of having a full-term pregnancy: Do not use any tobacco products, such as cigarettes, chewing tobacco, and e-cigarettes. If you need help quitting, ask your health care provider. Do not use street drugs or medicines that have not been prescribed to you during your pregnancy. Talk with your health care provider before taking any herbal supplements, even if you have been taking them regularly. Make sure you gain a healthy amount of weight during your pregnancy. Watch for infection. If you think that you might have an infection, get it checked right away. Make sure to tell your health care provider if you have gone into preterm labor before. This information is not intended to replace advice given to you by your health care provider. Make sure you discuss any questions you have with your health care provider. Document Revised: 10/31/2018 Document Reviewed: 11/30/2015 Elsevier Patient Education  2020 [employer]   

## 2023-06-26 ENCOUNTER — Encounter: Payer: Self-pay | Admitting: Advanced Practice Midwife

## 2023-06-26 ENCOUNTER — Ambulatory Visit (INDEPENDENT_AMBULATORY_CARE_PROVIDER_SITE_OTHER): Payer: Medicaid Other | Admitting: Advanced Practice Midwife

## 2023-06-26 VITALS — BP 116/68 | HR 94 | Wt 274.0 lb

## 2023-06-26 DIAGNOSIS — Z1389 Encounter for screening for other disorder: Secondary | ICD-10-CM

## 2023-06-26 DIAGNOSIS — Z3A32 32 weeks gestation of pregnancy: Secondary | ICD-10-CM

## 2023-06-26 DIAGNOSIS — Z331 Pregnant state, incidental: Secondary | ICD-10-CM

## 2023-06-26 DIAGNOSIS — J4520 Mild intermittent asthma, uncomplicated: Secondary | ICD-10-CM

## 2023-06-26 DIAGNOSIS — Z3480 Encounter for supervision of other normal pregnancy, unspecified trimester: Secondary | ICD-10-CM

## 2023-06-26 DIAGNOSIS — J45909 Unspecified asthma, uncomplicated: Secondary | ICD-10-CM | POA: Insufficient documentation

## 2023-06-26 DIAGNOSIS — K219 Gastro-esophageal reflux disease without esophagitis: Secondary | ICD-10-CM

## 2023-06-26 DIAGNOSIS — R350 Frequency of micturition: Secondary | ICD-10-CM

## 2023-06-26 LAB — POCT URINALYSIS DIPSTICK OB
Blood, UA: 2
Glucose, UA: NEGATIVE
Ketones, UA: NEGATIVE
Nitrite, UA: NEGATIVE

## 2023-06-26 MED ORDER — OMEPRAZOLE 20 MG PO TBEC: 1.0000 | DELAYED_RELEASE_TABLET | Freq: Every day | ORAL | 3 refills | Status: DC

## 2023-06-26 NOTE — Progress Notes (Signed)
   LOW-RISK PREGNANCY VISIT Patient name: Melody Stewart MRN [id number]  Date of birth: [birth year] Chief Complaint:   Routine Prenatal Visit (A lot acid reflux)  History of Present Illness:   Melody Stewart is a 37 y.o. G24P1001 female at [redacted]w[redacted]d with an Estimated Date of Delivery: 08/16/23 being seen today for ongoing management of a low-risk pregnancy.  Today she reports  heartburn, occ cramping, and darker urine (no dysuria or back pain); hx of kidney stones requiring stent placement during first pregnancy . Contractions: Irregular.  .  Movement: Present. denies leaking of fluid. Review of Systems:   Pertinent items are noted in HPI Denies abnormal vaginal discharge w/ itching/odor/irritation, headaches, visual changes, shortness of breath, chest pain, abdominal pain, severe nausea/vomiting, or problems with urination or bowel movements unless otherwise stated above. Pertinent History Reviewed:  Reviewed past medical,surgical, social, obstetrical and family history.  Reviewed problem list, medications and allergies. Physical Assessment:   Vitals:   06/26/23 1555  BP: 116/68  Pulse: 94  Weight: 274 lb (124.3 kg)  Body mass index is 44.22 kg/m.        Physical Examination:   General appearance: Well appearing, and in no distress  Mental status: Alert, oriented to person, place, and time  Skin: Warm & dry  Cardiovascular: Normal heart rate noted  Respiratory: Normal respiratory effort, no distress  Abdomen: Soft, gravid, nontender  Pelvic: Cervical exam deferred         Extremities: Edema: Trace  Fetal Status: Fetal Heart Rate (bpm): 155 Fundal Height: 33 cm Movement: Present    No results found for this or any previous visit (from the past 24 hour(s)).  Assessment & Plan:  1) Low-risk pregnancy G2P1001 at [redacted]w[redacted]d with an Estimated Date of Delivery: 08/16/23   2) GERD, rx omeprazole   3) Dark urine, blood in UA; will get culture to r/o UTI; suspicious for small kidney stone- denies  dysuria or flank pain; required bilat stent placement for kidney stones in first preg  4) Hx pre-e, stable BPs, taking bASA  5) Prev C/S, wants repeat; has visit w JO next and can schedule   Meds:  Meds ordered this encounter  Medications   Omeprazole 20 MG TBEC    Sig: Take 1 tablet (20 mg total) by mouth daily.    Dispense:  30 tablet    Refill:  3    Order Specific Question:   Supervising Provider    Answer:   Myna Hidalgo [[id number]]   Labs/procedures today: UA/urine culture  Plan:  Continue routine obstetrical care   Reviewed: Preterm labor symptoms and general obstetric precautions including but not limited to vaginal bleeding, contractions, leaking of fluid and fetal movement were reviewed in detail with the patient.  All questions were answered. Has home bp cuff. Check bp weekly, let us know if >140/90.   Follow-up: Return for As scheduled.  No orders of the defined types were placed in this encounter.  Arabella Merles CNM 06/26/2023 4:23 PM

## 2023-06-26 NOTE — Patient Instructions (Signed)
Melody Stewart, thank you for choosing our office today! We appreciate the opportunity to meet your healthcare needs. You may receive a short survey by mail, e-mail, or through [employer]. If you are happy with your care we would appreciate if you could take just a few minutes to complete the survey questions. We read all of your comments and take your feedback very seriously. Thank you again for choosing our office.  Center for [employer] Team at [facility]  [facility] at [facility] ([address] [place], [place] [address]) Entrance C, located off of [address] Free 24/7 valet parking   CLASSES: Go to [email or web address] to register for classes (childbirth, breastfeeding, waterbirth, infant CPR, daddy bootcamp, etc.)  Call the office [phone]) or go to [facility] if: You begin to have strong, frequent contractions Your water breaks.  Sometimes it is a big gush of fluid, sometimes it is just a trickle that keeps getting your panties wet or running down your legs You have vaginal bleeding.  It is normal to have a small amount of spotting if your cervix was checked.  You don't feel your baby moving like normal.  If you don't, get you something to eat and drink and lay down and focus on feeling your baby move.   If your baby is still not moving like normal, you should call the office or go to [facility].  Call the office [phone]) or go to [facility] hospital for these signs of pre-eclampsia: Severe headache that does not go away with Tylenol Visual changes- seeing spots, double, blurred vision Pain under your right breast or upper abdomen that does not go away with Tums or heartburn medicine Nausea and/or vomiting Severe swelling in your hands, feet, and face   Tdap Vaccine It is recommended that you get the Tdap vaccine during the third trimester of EACH pregnancy to help protect your baby from getting pertussis (whooping cough) 27-36 weeks is the BEST time to do  this so that you can pass the protection on to your baby. During pregnancy is better than after pregnancy, but if you are unable to get it during pregnancy it will be offered at the hospital.  You can get this vaccine with [place], at the health department, your family doctor, or some local pharmacies Everyone who will be around your baby should also be up-to-date on their vaccines before the baby comes. Adults (who are not pregnant) only need 1 dose of Tdap during adulthood.   [facility] Pediatricians/Family Doctors [facility]): [address] Colette Ribas, [phone]           [facility]: [address], [phone]                [facility]): [address], [phone] (call to ask if accepting patients) [facility] Department: [address], [place], [phone]    [facility] Pediatricians/Family Doctors [facility]): [phone], [phone] [facility]: [address] [place], [phone] [facility]: [address], [phone]  [facility] Doctors  [facility]): [phone] [facility]: [address], [phone]   [facility] Doctors [facility]: [address] [phone]  [facility] Family Doctors  [facility]: [phone], [phone]  Home Blood Pressure Monitoring for Patients   Your provider has recommended that you check your  blood pressure (BP) at least once a week at home. If you do not have a blood pressure cuff at home, one will be provided for you. Contact your provider if you have not received your monitor within 1 week.   Helpful Tips for Accurate Home Blood Pressure Checks  Don't smoke, exercise, or drink caffeine 30 minutes before checking your BP Use the restroom before checking your BP (a full bladder can raise your  pressure) Relax in a comfortable upright chair Feet on the ground Left arm resting comfortably on a flat surface at the level of your heart Legs uncrossed Back supported Sit quietly and don't talk Place the cuff on your bare arm Adjust snuggly, so that only two fingertips can fit between your skin and the top of the cuff Check 2 readings separated by at least one minute Keep a log of your BP readings For a visual, please reference this diagram: http://ccnc.care/bpdiagram  Provider Name: [facility]     Phone: [phone]  Zone 1: ALL CLEAR  Continue to monitor your symptoms:  BP reading is less than 140 (top number) or less than 90 (bottom number)  No right upper stomach pain No headaches or seeing spots No feeling nauseated or throwing up No swelling in face and hands  Zone 2: CAUTION Call your doctor's office for any of the following:  BP reading is greater than 140 (top number) or greater than 90 (bottom number)  Stomach pain under your ribs in the middle or right side Headaches or seeing spots Feeling nauseated or throwing up Swelling in face and hands  Zone 3: EMERGENCY  Seek immediate medical care if you have any of the following:  BP reading is greater than160 (top number) or greater than 110 (bottom number) Severe headaches not improving with Tylenol Serious difficulty catching your breath Any worsening symptoms from Zone 2   Third Trimester of Pregnancy The third trimester is from week 29 through week 42, months 7 through 9. The third trimester is a time when the fetus is growing rapidly. At the end of the ninth month, the fetus is about 20 inches in length and weighs 6-10 pounds.  BODY CHANGES Your body goes through many changes during pregnancy. The changes vary from woman to woman.  Your weight will continue to increase. You can expect to gain 25-35 pounds (11-16 kg) by the end of the pregnancy. You may begin to get stretch marks on your hips, abdomen,  and breasts. You may urinate more often because the fetus is moving lower into your pelvis and pressing on your bladder. You may develop or continue to have heartburn as a result of your pregnancy. You may develop constipation because certain hormones are causing the muscles that push waste through your intestines to slow down. You may develop hemorrhoids or swollen, bulging veins (varicose veins). You may have pelvic pain because of the weight gain and pregnancy hormones relaxing your joints between the bones in your pelvis. Backaches may result from overexertion of the muscles supporting your posture. You may have changes in your hair. These can include thickening of your hair, rapid growth, and changes in texture. Some women also have hair loss during or after pregnancy, or hair that feels dry or thin. Your hair will most likely return to normal after your baby is born. Your breasts will continue to grow and be tender. A yellow discharge may leak from your breasts called colostrum. Your belly button may stick out. You may   feel short of breath because of your expanding uterus. You may notice the fetus "dropping," or moving lower in your abdomen. You may have a bloody mucus discharge. This usually occurs a few days to a week before labor begins. Your cervix becomes thin and soft (effaced) near your due date. WHAT TO EXPECT AT YOUR PRENATAL EXAMS  You will have prenatal exams every 2 weeks until week 36. Then, you will have weekly prenatal exams. During a routine prenatal visit: You will be weighed to make sure you and the fetus are growing normally. Your blood pressure is taken. Your abdomen will be measured to track your baby's growth. The fetal heartbeat will be listened to. Any test results from the previous visit will be discussed. You may have a cervical check near your due date to see if you have effaced. At around 36 weeks, your caregiver will check your cervix. At the same time, your  caregiver will also perform a test on the secretions of the vaginal tissue. This test is to determine if a type of bacteria, Group B streptococcus, is present. Your caregiver will explain this further. Your caregiver may ask you: What your birth plan is. How you are feeling. If you are feeling the baby move. If you have had any abnormal symptoms, such as leaking fluid, bleeding, severe headaches, or abdominal cramping. If you have any questions. Other tests or screenings that may be performed during your third trimester include: Blood tests that check for low iron levels (anemia). Fetal testing to check the health, activity level, and growth of the fetus. Testing is done if you have certain medical conditions or if there are problems during the pregnancy. FALSE LABOR You may feel small, irregular contractions that eventually go away. These are called Braxton Hicks contractions, or false labor. Contractions may last for hours, days, or even weeks before true labor sets in. If contractions come at regular intervals, intensify, or become painful, it is best to be seen by your caregiver.  SIGNS OF LABOR  Menstrual-like cramps. Contractions that are 5 minutes apart or less. Contractions that start on the top of the uterus and spread down to the lower abdomen and back. A sense of increased pelvic pressure or back pain. A watery or bloody mucus discharge that comes from the vagina. If you have any of these signs before the 37th week of pregnancy, call your caregiver right away. You need to go to the hospital to get checked immediately. HOME CARE INSTRUCTIONS  Avoid all smoking, herbs, alcohol, and unprescribed drugs. These chemicals affect the formation and growth of the baby. Follow your caregiver's instructions regarding medicine use. There are medicines that are either safe or unsafe to take during pregnancy. Exercise only as directed by your caregiver. Experiencing uterine cramps is a good sign to  stop exercising. Continue to eat regular, healthy meals. Wear a good support bra for breast tenderness. Do not use hot tubs, steam rooms, or saunas. Wear your seat belt at all times when driving. Avoid raw meat, uncooked cheese, cat litter boxes, and soil used by cats. These carry germs that can cause birth defects in the baby. Take your prenatal vitamins. Try taking a stool softener (if your caregiver approves) if you develop constipation. Eat more high-fiber foods, such as fresh vegetables or fruit and whole grains. Drink plenty of fluids to keep your urine clear or pale yellow. Take warm sitz baths to soothe any pain or discomfort caused by hemorrhoids. Use hemorrhoid cream if  your caregiver approves. If you develop varicose veins, wear support hose. Elevate your feet for 15 minutes, 3-4 times a day. Limit salt in your diet. Avoid heavy lifting, wear low heal shoes, and practice good posture. Rest a lot with your legs elevated if you have leg cramps or low back pain. Visit your dentist if you have not gone during your pregnancy. Use a soft toothbrush to brush your teeth and be gentle when you floss. A sexual relationship may be continued unless your caregiver directs you otherwise. Do not travel far distances unless it is absolutely necessary and only with the approval of your caregiver. Take prenatal classes to understand, practice, and ask questions about the labor and delivery. Make a trial run to the hospital. Pack your hospital bag. Prepare the baby's nursery. Continue to go to all your prenatal visits as directed by your caregiver. SEEK MEDICAL CARE IF: You are unsure if you are in labor or if your water has broken. You have dizziness. You have mild pelvic cramps, pelvic pressure, or nagging pain in your abdominal area. You have persistent nausea, vomiting, or diarrhea. You have a bad smelling vaginal discharge. You have pain with urination. SEEK IMMEDIATE MEDICAL CARE IF:  You  have a fever. You are leaking fluid from your vagina. You have spotting or bleeding from your vagina. You have severe abdominal cramping or pain. You have rapid weight loss or gain. You have shortness of breath with chest pain. You notice sudden or extreme swelling of your face, hands, ankles, feet, or legs. You have not felt your baby move in over an hour. You have severe headaches that do not go away with medicine. You have vision changes. Document Released: 07/03/2001 Document Revised: 07/14/2013 Document Reviewed: 09/09/2012 [facility] Patient Information 2015 [place], [place]. This information is not intended to replace advice given to you by your health care provider. Make sure you discuss any questions you have with your health care provider.

## 2023-06-28 LAB — URINE CULTURE

## 2023-07-10 ENCOUNTER — Ambulatory Visit (INDEPENDENT_AMBULATORY_CARE_PROVIDER_SITE_OTHER): Payer: Medicaid Other | Admitting: Obstetrics & Gynecology

## 2023-07-10 VITALS — BP 116/55 | HR 91 | Wt 275.0 lb

## 2023-07-10 DIAGNOSIS — Z3A34 34 weeks gestation of pregnancy: Secondary | ICD-10-CM

## 2023-07-10 DIAGNOSIS — Z3480 Encounter for supervision of other normal pregnancy, unspecified trimester: Secondary | ICD-10-CM

## 2023-07-10 DIAGNOSIS — Z98891 History of uterine scar from previous surgery: Secondary | ICD-10-CM

## 2023-07-10 DIAGNOSIS — O34219 Maternal care for unspecified type scar from previous cesarean delivery: Secondary | ICD-10-CM

## 2023-07-10 NOTE — Progress Notes (Signed)
   LOW-RISK PREGNANCY VISIT Patient name: Melody Stewart MRN [id number]  Date of birth: [birth year] Chief Complaint:   Routine Prenatal Visit  History of Present Illness:   Melody Stewart is a 37 y.o. G51P1001 female at [redacted]w[redacted]d with an Estimated Date of Delivery: 08/16/23 being seen today for ongoing management of a low-risk pregnancy.   -prior C-section, desires repeat with IUD insertion -asthma -anx/dep- no meds    02/19/2023    3:31 PM 12/06/2021    3:45 PM 09/20/2020   11:38 AM 06/15/2019    8:48 AM 07/24/2018   10:56 AM  Depression screen PHQ 2/9  Decreased Interest 0 2 1 1  0  Down, Depressed, Hopeless 0 1 1 2 1   PHQ - 2 Score 0 3 2 3 1   Altered sleeping 1 3 1 3    Tired, decreased energy 2 3 3 3    Change in appetite 1 3 0 2   Feeling bad or failure about yourself  0 2 0 2   Trouble concentrating 0 2 0 2   Moving slowly or fidgety/restless 0 0 0 0   Suicidal thoughts 0 0 0 0   PHQ-9 Score 4 16 6 15    Difficult doing work/chores    Very difficult     Today she reports no complaints. Contractions: Irregular. Vag. Bleeding: None.  Movement: Present. denies leaking of fluid. Review of Systems:   Pertinent items are noted in HPI Denies abnormal vaginal discharge w/ itching/odor/irritation, headaches, visual changes, shortness of breath, chest pain, abdominal pain, severe nausea/vomiting, or problems with urination or bowel movements unless otherwise stated above. Pertinent History Reviewed:  Reviewed past medical,surgical, social, obstetrical and family history.  Reviewed problem list, medications and allergies.  Physical Assessment:   Vitals:   07/10/23 1605  BP: (!) 116/55  Pulse: 91  Weight: 275 lb (124.7 kg)  Body mass index is 44.39 kg/m.        Physical Examination:   General appearance: Well appearing, and in no distress  Mental status: Alert, oriented to person, place, and time  Skin: Warm & dry  Respiratory: Normal respiratory effort, no distress  Abdomen: Soft,  gravid, nontender  Pelvic: Cervical exam deferred         Extremities:  no edema  Psych:  mood and affect appropriate  Fetal Status: Fetal Heart Rate (bpm): 130 Fundal Height: 33 cm Movement: Present    Chaperone: n/a    No results found for this or any previous visit (from the past 24 hours).   Assessment & Plan:  1) Low-risk pregnancy G2P1001 at [redacted]w[redacted]d with an Estimated Date of Delivery: 08/16/23   2) Prior C-section- desires repeat Referral created for Jan 17   Meds: No orders of the defined types were placed in this encounter.  Labs/procedures today: doppler  Plan:  Continue routine obstetrical care  Next visit: prefers in person    Reviewed: Preterm labor symptoms and general obstetric precautions including but not limited to vaginal bleeding, contractions, leaking of fluid and fetal movement were reviewed in detail with the patient.  All questions were answered. Pt has home bp cuff. Check bp weekly, let us know if >140/90.   Follow-up: Return in about 2 weeks (around 07/24/2023) for LROB visit.  Orders Placed This Encounter  Procedures   Ambulatory Referral For Surgery Scheduling    Myna Hidalgo, DO Attending Obstetrician & Gynecologist, Faculty Practice Center for [facility], [facility]

## 2023-07-15 ENCOUNTER — Other Ambulatory Visit: Payer: Self-pay | Admitting: Obstetrics & Gynecology

## 2023-07-15 DIAGNOSIS — Z3480 Encounter for supervision of other normal pregnancy, unspecified trimester: Secondary | ICD-10-CM

## 2023-07-15 DIAGNOSIS — Z98891 History of uterine scar from previous surgery: Secondary | ICD-10-CM

## 2023-07-15 NOTE — Progress Notes (Signed)
Preop orders  Myna Hidalgo, DO Attending Obstetrician & Gynecologist, [facility] for [facility], [facility]

## 2023-07-22 ENCOUNTER — Encounter: Payer: Medicaid Other | Admitting: Women's Health

## 2023-07-23 ENCOUNTER — Other Ambulatory Visit (HOSPITAL_COMMUNITY)
Admission: RE | Admit: 2023-07-23 | Discharge: 2023-07-23 | Disposition: A | Discharge status: Home / Self Care | Payer: Medicaid Other | Source: Ambulatory Visit | Attending: Obstetrics & Gynecology | Admitting: Obstetrics & Gynecology

## 2023-07-23 ENCOUNTER — Encounter: Payer: Self-pay | Admitting: Obstetrics & Gynecology

## 2023-07-23 ENCOUNTER — Ambulatory Visit: Payer: Medicaid Other | Admitting: Obstetrics & Gynecology

## 2023-07-23 VITALS — BP 118/70 | HR 106 | Wt 279.0 lb

## 2023-07-23 DIAGNOSIS — Z3A36 36 weeks gestation of pregnancy: Secondary | ICD-10-CM | POA: Insufficient documentation

## 2023-07-23 DIAGNOSIS — Z3483 Encounter for supervision of other normal pregnancy, third trimester: Secondary | ICD-10-CM

## 2023-07-23 DIAGNOSIS — Z23 Encounter for immunization: Secondary | ICD-10-CM

## 2023-07-23 DIAGNOSIS — Z3480 Encounter for supervision of other normal pregnancy, unspecified trimester: Secondary | ICD-10-CM

## 2023-07-23 NOTE — Addendum Note (Signed)
Addended by: Annamarie Dawley on: 07/23/2023 05:01 PM   Modules accepted: Orders

## 2023-07-23 NOTE — Progress Notes (Signed)
   LOW-RISK PREGNANCY VISIT Patient name: Melody Stewart MRN [id number]  Date of birth: Nov 27, 1985 Chief Complaint:   Routine Prenatal Visit  History of Present Illness:   Melody Stewart is a 37 y.o. G16P1001 female at [redacted]w[redacted]d with an Estimated Date of Delivery: 08/16/23 being seen today for ongoing management of a low-risk pregnancy.     02/19/2023    3:31 PM 12/06/2021    3:45 PM 09/20/2020   11:38 AM 06/15/2019    8:48 AM 07/24/2018   10:56 AM  Depression screen PHQ 2/9  Decreased Interest 0 2 1 1  0  Down, Depressed, Hopeless 0 1 1 2 1   PHQ - 2 Score 0 3 2 3 1   Altered sleeping 1 3 1 3    Tired, decreased energy 2 3 3 3    Change in appetite 1 3 0 2   Feeling bad or failure about yourself  0 2 0 2   Trouble concentrating 0 2 0 2   Moving slowly or fidgety/restless 0 0 0 0   Suicidal thoughts 0 0 0 0   PHQ-9 Score 4 16 6 15    Difficult doing work/chores    Very difficult     Today she reports pelvic pressure. Contractions: Irritability. Vag. Bleeding: None.  Movement: Present. denies leaking of fluid. Review of Systems:   Pertinent items are noted in HPI Denies abnormal vaginal discharge w/ itching/odor/irritation, headaches, visual changes, shortness of breath, chest pain, abdominal pain, severe nausea/vomiting, or problems with urination or bowel movements unless otherwise stated above. Pertinent History Reviewed:  Reviewed past medical,surgical, social, obstetrical and family history.  Reviewed problem list, medications and allergies. Physical Assessment:   Vitals:   07/23/23 1557  BP: 118/70  Pulse: (!) 106  Weight: 279 lb (126.6 kg)  Body mass index is 45.03 kg/m.        Physical Examination:   General appearance: Well appearing, and in no distress  Mental status: Alert, oriented to person, place, and time  Skin: Warm & dry  Cardiovascular: Normal heart rate noted  Respiratory: Normal respiratory effort, no distress  Abdomen: Soft, gravid, nontender  Pelvic: LTC         Extremities:    Fetal Status:     Movement: Present    Chaperone: n/a    No results found for this or any previous visit (from the past 24 hours).  Assessment & Plan:  1) Low-risk pregnancy G2P1001 at [redacted]w[redacted]d with an Estimated Date of Delivery: 08/16/23      Meds: No orders of the defined types were placed in this encounter.  Labs/procedures today: cultures  Plan:  Continue routine obstetrical care  Next visit: prefers in person      Follow-up: No follow-ups on file.  Orders Placed This Encounter  Procedures   Culture, beta strep (group b only)    Vonn VEAR Inch, MD 07/23/2023 4:51 PM

## 2023-07-25 LAB — CERVICOVAGINAL ANCILLARY ONLY
Chlamydia: NEGATIVE
Comment: NEGATIVE
Comment: NORMAL
Neisseria Gonorrhea: NEGATIVE

## 2023-07-26 ENCOUNTER — Encounter (HOSPITAL_COMMUNITY): Payer: Self-pay

## 2023-07-26 ENCOUNTER — Telehealth (HOSPITAL_COMMUNITY): Payer: Self-pay | Admitting: *Deleted

## 2023-07-26 NOTE — Patient Instructions (Signed)
 TAIJAH COWINS  07/26/2023   Your procedure is scheduled on:  08/09/2023  Arrive at 0745 at Entrance C on [employer] at [facility]  and [employer]. You are invited to use the FREE valet parking or use the Visitor's parking deck.  Pick up the phone at the desk and dial [phone].  Call this number if you have problems the morning of surgery: [phone]  Remember:   Do not eat food:(After Midnight) Desps de medianoche.  You may drink clear liquids until arrival at __0745___.  Clear liquids means a liquid you can see thru.  It can have color such as Cola or Kool aid.  Tea is OK and coffee as long as no milk or creamer of any kind.  Take these medicines the morning of surgery with A SIP OF WATER :  Bring your inhaler   Do not wear jewelry, make-up or nail polish.  Do not wear lotions, powders, or perfumes. Do not wear deodorant.  Do not shave 48 hours prior to surgery.  Do not bring valuables to the hospital.  [facility] is not   responsible for any belongings or valuables brought to the hospital.  Contacts, dentures or bridgework may not be worn into surgery.  Leave suitcase in the car. After surgery it may be brought to your room.  For patients admitted to the hospital, checkout time is 11:00 AM the day of              discharge.      Please read over the following fact sheets that you were given:     Preparing for Surgery

## 2023-07-26 NOTE — Telephone Encounter (Signed)
 Preadmission screen

## 2023-07-27 LAB — CULTURE, BETA STREP (GROUP B ONLY): Strep Gp B Culture: NEGATIVE

## 2023-07-29 ENCOUNTER — Encounter (HOSPITAL_COMMUNITY): Payer: Self-pay

## 2023-07-29 ENCOUNTER — Ambulatory Visit (INDEPENDENT_AMBULATORY_CARE_PROVIDER_SITE_OTHER): Payer: Medicaid Other | Admitting: Obstetrics & Gynecology

## 2023-07-29 ENCOUNTER — Encounter: Payer: Self-pay | Admitting: Obstetrics & Gynecology

## 2023-07-29 VITALS — BP 103/58 | HR 105 | Wt 277.8 lb

## 2023-07-29 DIAGNOSIS — Z3A37 37 weeks gestation of pregnancy: Secondary | ICD-10-CM

## 2023-07-29 DIAGNOSIS — O34219 Maternal care for unspecified type scar from previous cesarean delivery: Secondary | ICD-10-CM

## 2023-07-29 DIAGNOSIS — Z3480 Encounter for supervision of other normal pregnancy, unspecified trimester: Secondary | ICD-10-CM

## 2023-07-29 DIAGNOSIS — Z98891 History of uterine scar from previous surgery: Secondary | ICD-10-CM

## 2023-07-29 NOTE — Progress Notes (Signed)
 LOW-RISK PREGNANCY VISIT Patient name: Melody Stewart MRN [id number]  Date of birth: 1985-12-20 Chief Complaint:   Routine Prenatal Visit  History of Present Illness:   Melody Stewart is a 38 y.o. G3P1001 female at [redacted]w[redacted]d with an Estimated Date of Delivery: 08/16/23 being seen today for ongoing management of a low-risk pregnancy.   -prior C-section, desires repeat with IUD     02/19/2023    3:31 PM 12/06/2021    3:45 PM 09/20/2020   11:38 AM 06/15/2019    8:48 AM 07/24/2018   10:56 AM  Depression screen PHQ 2/9  Decreased Interest 0 2 1 1  0  Down, Depressed, Hopeless 0 1 1 2 1   PHQ - 2 Score 0 3 2 3 1   Altered sleeping 1 3 1 3    Tired, decreased energy 2 3 3 3    Change in appetite 1 3 0 2   Feeling bad or failure about yourself  0 2 0 2   Trouble concentrating 0 2 0 2   Moving slowly or fidgety/restless 0 0 0 0   Suicidal thoughts 0 0 0 0   PHQ-9 Score 4 16 6 15    Difficult doing work/chores    Very difficult     Today she reports backache. Contractions: Irritability. Vag. Bleeding: None.  Movement: Present. denies leaking of fluid.  Notes fetal movement, but not as much as previously.  She may go 2 hours and not feel much then eat/drink something and note movement Review of Systems:   Pertinent items are noted in HPI Denies abnormal vaginal discharge w/ itching/odor/irritation, headaches, visual changes, shortness of breath, chest pain, abdominal pain, severe nausea/vomiting, or problems with urination or bowel movements unless otherwise stated above. Pertinent History Reviewed:  Reviewed past medical,surgical, social, obstetrical and family history.  Reviewed problem list, medications and allergies.  Physical Assessment:   Vitals:   07/29/23 1559  BP: (!) 103/58  Pulse: (!) 105  Weight: 277 lb 12.8 oz (126 kg)  Body mass index is 44.84 kg/m.        Physical Examination:   General appearance: Well appearing, and in no distress  Mental status: Alert, oriented to person,  place, and time  Skin: Warm & dry  Respiratory: Normal respiratory effort, no distress  Abdomen: Soft, gravid, nontender  Pelvic: Cervical exam deferred         Extremities:    Psych:  mood and affect appropriate  Fetal Status: Fetal Heart Rate (bpm): 155 Fundal Height: 37 cm Movement: Present    Chaperone: n/a    No results found for this or any previous visit (from the past 24 hours).   Assessment & Plan:  1) Low-risk pregnancy G2P1001 at [redacted]w[redacted]d with an Estimated Date of Delivery: 08/16/23   -reviewed fetal kick counts, low threshold for MAU should she not feel good fetal movement   Meds: No orders of the defined types were placed in this encounter.  Labs/procedures today: doppler  Plan:  Continue routine obstetrical care  Next visit: prefers in person    Reviewed: Term labor symptoms and general obstetric precautions including but not limited to vaginal bleeding, contractions, leaking of fluid and fetal movement were reviewed in detail with the patient.  All questions were answered. Pt has home bp cuff. Check bp weekly, let us  know if >140/90.   Follow-up: Return in about 1 week (around 08/05/2023) for LROB visit as scheduled.  No orders of the defined types were placed in this encounter.  Sejal Cofield, DO Attending Obstetrician & Gynecologist, [facility] for [employer], [facility]

## 2023-08-05 ENCOUNTER — Other Ambulatory Visit: Payer: Self-pay

## 2023-08-05 ENCOUNTER — Encounter: Payer: Medicaid Other | Admitting: Women's Health

## 2023-08-05 ENCOUNTER — Inpatient Hospital Stay (HOSPITAL_COMMUNITY)
Admission: AD | Admit: 2023-08-05 | Discharge: 2023-08-05 | Disposition: A | Discharge status: Home / Self Care | Payer: Medicaid Other | Attending: Obstetrics & Gynecology | Admitting: Obstetrics & Gynecology

## 2023-08-05 ENCOUNTER — Encounter (HOSPITAL_COMMUNITY): Payer: Self-pay | Admitting: Obstetrics & Gynecology

## 2023-08-05 DIAGNOSIS — O479 False labor, unspecified: Secondary | ICD-10-CM

## 2023-08-05 DIAGNOSIS — O471 False labor at or after 37 completed weeks of gestation: Secondary | ICD-10-CM | POA: Diagnosis present

## 2023-08-05 DIAGNOSIS — Z3A38 38 weeks gestation of pregnancy: Secondary | ICD-10-CM | POA: Insufficient documentation

## 2023-08-05 DIAGNOSIS — Z3689 Encounter for other specified antenatal screening: Secondary | ICD-10-CM | POA: Diagnosis not present

## 2023-08-05 MED ORDER — TERBUTALINE SULFATE 1 MG/ML IJ SOLN
0.2500 mg | Freq: Once | INTRAMUSCULAR | Status: AC
  Administered 2023-08-05: 0.25 mg via SUBCUTANEOUS
  Filled 2023-08-05: qty 1

## 2023-08-05 NOTE — MAU Note (Signed)
..  Melody Stewart is a 38 y.o. at [redacted]w[redacted]d here in MAU reporting: ctx throughout the night. Still feels like they are irregular but plans r/cs so wanted to make sure. Denies VB or LOF. +FM.   NPO: last ate last night at 2000. Last drank water  at 0000.  Pain score: 7 Vitals:   08/05/23 1033  BP: 138/77  Pulse: (!) 110  Resp: 18  Temp: 98.1 F (36.7 C)  SpO2: 97%     FHT:138 Lab orders placed from triage:   mau labor

## 2023-08-05 NOTE — MAU Provider Note (Signed)
 S: Ms. Melody Stewart is a 38 y.o. G2P1001 at [redacted]w[redacted]d  who presents to MAU today for labor evaluation.     Cervical exam by RN:  Dilation: Closed Effacement (%): Thick Cervical Position: Posterior Station: -3 Exam by:: Tehillah richard rnc  Fetal Monitoring: Baseline: 120 Variability: mod Accelerations: present Decelerations: absent Contractions: irregular  MDM Assessed patient after cervix unchanged given plan for repeat C/S. Will give one dose of terbutaline  for symptomatic relief prior to discharge.  A: SIUP at [redacted]w[redacted]d  False labor  P: Discharge home Labor precautions and kick counts included in AVS Patient to follow-up with primary OB as scheduled  Patient may return to MAU as needed or when in labor   Nicholaus Almarie HERO, MD 08/05/2023 12:49 PM

## 2023-08-07 ENCOUNTER — Other Ambulatory Visit (HOSPITAL_COMMUNITY)
Admission: RE | Admit: 2023-08-07 | Discharge: 2023-08-07 | Disposition: A | Discharge status: Home / Self Care | Payer: Medicaid Other | Source: Ambulatory Visit | Attending: Obstetrics and Gynecology | Admitting: Obstetrics and Gynecology

## 2023-08-07 DIAGNOSIS — Z3480 Encounter for supervision of other normal pregnancy, unspecified trimester: Secondary | ICD-10-CM | POA: Diagnosis not present

## 2023-08-07 DIAGNOSIS — Z01812 Encounter for preprocedural laboratory examination: Secondary | ICD-10-CM | POA: Diagnosis present

## 2023-08-07 DIAGNOSIS — Z98891 History of uterine scar from previous surgery: Secondary | ICD-10-CM | POA: Insufficient documentation

## 2023-08-07 LAB — TYPE AND SCREEN
ABO/RH(D): B POS
Antibody Screen: NEGATIVE

## 2023-08-07 LAB — CBC
HCT: 36.7 % (ref 36.0–46.0)
Hemoglobin: 11.8 g/dL — ABNORMAL LOW (ref 12.0–15.0)
MCH: 28.3 pg (ref 26.0–34.0)
MCHC: 32.2 g/dL (ref 30.0–36.0)
MCV: 88 fL (ref 80.0–100.0)
Platelets: 303 10*3/uL (ref 150–400)
RBC: 4.17 MIL/uL (ref 3.87–5.11)
RDW: 14.1 % (ref 11.5–15.5)
WBC: 9.5 10*3/uL (ref 4.0–10.5)
nRBC: 0 % (ref 0.0–0.2)

## 2023-08-08 LAB — RPR: RPR Ser Ql: NONREACTIVE

## 2023-08-09 ENCOUNTER — Encounter (HOSPITAL_COMMUNITY)
Admission: RE | Disposition: A | Discharge status: Home / Self Care | Payer: Self-pay | Source: Home / Self Care | Attending: Obstetrics & Gynecology

## 2023-08-09 ENCOUNTER — Inpatient Hospital Stay (HOSPITAL_COMMUNITY): Payer: Medicaid Other | Admitting: Anesthesiology

## 2023-08-09 ENCOUNTER — Encounter (HOSPITAL_COMMUNITY): Payer: Self-pay | Admitting: Obstetrics & Gynecology

## 2023-08-09 ENCOUNTER — Other Ambulatory Visit: Payer: Self-pay

## 2023-08-09 ENCOUNTER — Inpatient Hospital Stay (HOSPITAL_COMMUNITY)
Admission: RE | Admit: 2023-08-09 | Discharge: 2023-08-11 | DRG: 788 | Disposition: A | Discharge status: Home / Self Care | Payer: Medicaid Other | Attending: Obstetrics and Gynecology | Admitting: Obstetrics and Gynecology

## 2023-08-09 DIAGNOSIS — Z8249 Family history of ischemic heart disease and other diseases of the circulatory system: Secondary | ICD-10-CM

## 2023-08-09 DIAGNOSIS — Z87891 Personal history of nicotine dependence: Secondary | ICD-10-CM | POA: Diagnosis not present

## 2023-08-09 DIAGNOSIS — Z3A39 39 weeks gestation of pregnancy: Secondary | ICD-10-CM

## 2023-08-09 DIAGNOSIS — Z30430 Encounter for insertion of intrauterine contraceptive device: Secondary | ICD-10-CM

## 2023-08-09 DIAGNOSIS — O34211 Maternal care for low transverse scar from previous cesarean delivery: Secondary | ICD-10-CM | POA: Diagnosis not present

## 2023-08-09 DIAGNOSIS — Z885 Allergy status to narcotic agent status: Secondary | ICD-10-CM | POA: Diagnosis not present

## 2023-08-09 DIAGNOSIS — Z98891 History of uterine scar from previous surgery: Secondary | ICD-10-CM

## 2023-08-09 DIAGNOSIS — O99344 Other mental disorders complicating childbirth: Secondary | ICD-10-CM | POA: Diagnosis not present

## 2023-08-09 DIAGNOSIS — Z5986 Financial insecurity: Secondary | ICD-10-CM

## 2023-08-09 DIAGNOSIS — Z975 Presence of (intrauterine) contraceptive device: Secondary | ICD-10-CM

## 2023-08-09 DIAGNOSIS — Z3490 Encounter for supervision of normal pregnancy, unspecified, unspecified trimester: Secondary | ICD-10-CM

## 2023-08-09 DIAGNOSIS — O09299 Supervision of pregnancy with other poor reproductive or obstetric history, unspecified trimester: Secondary | ICD-10-CM

## 2023-08-09 DIAGNOSIS — Z3480 Encounter for supervision of other normal pregnancy, unspecified trimester: Principal | ICD-10-CM

## 2023-08-09 DIAGNOSIS — F419 Anxiety disorder, unspecified: Secondary | ICD-10-CM | POA: Diagnosis present

## 2023-08-09 HISTORY — PX: INTRAUTERINE DEVICE (IUD) INSERTION: SHX5877

## 2023-08-09 LAB — CREATININE, SERUM
Creatinine, Ser: 0.77 mg/dL (ref 0.44–1.00)
GFR, Estimated: >60 mL/min (ref >60)

## 2023-08-09 SURGERY — Surgical Case
Anesthesia: Spinal

## 2023-08-09 MED ORDER — NALOXONE HCL 0.4 MG/ML IJ SOLN: 0.4000 mg | INTRAMUSCULAR | Status: DC | PRN

## 2023-08-09 MED ORDER — LORATADINE 10 MG PO TABS
10.0000 mg | ORAL_TABLET | Freq: Every day | ORAL | Status: DC
  Administered 2023-08-10 – 2023-08-11 (×2): 10 mg via ORAL
  Filled 2023-08-09 (×2): qty 1

## 2023-08-09 MED ORDER — NALOXONE HCL 4 MG/10ML IJ SOLN: 1.0000 ug/kg/h | INTRAVENOUS | Status: DC | PRN

## 2023-08-09 MED ORDER — DEXAMETHASONE SODIUM PHOSPHATE 10 MG/ML IJ SOLN
INTRAMUSCULAR | Status: DC | PRN
  Administered 2023-08-09: 10 mg via INTRAVENOUS

## 2023-08-09 MED ORDER — LACTATED RINGERS IV SOLN: INTRAVENOUS | Status: DC

## 2023-08-09 MED ORDER — SODIUM CHLORIDE 0.9% FLUSH: 3.0000 mL | INTRAVENOUS | Status: DC | PRN

## 2023-08-09 MED ORDER — OXYTOCIN 10 UNIT/ML IJ SOLN
INTRAMUSCULAR | Status: AC
  Filled 2023-08-09: qty 1

## 2023-08-09 MED ORDER — SENNOSIDES-DOCUSATE SODIUM 8.6-50 MG PO TABS
2.0000 | ORAL_TABLET | Freq: Every day | ORAL | Status: DC
  Administered 2023-08-10: 2 via ORAL
  Filled 2023-08-09 (×2): qty 2

## 2023-08-09 MED ORDER — PRENATAL MULTIVITAMIN CH
1.0000 | ORAL_TABLET | Freq: Every day | ORAL | Status: DC
  Administered 2023-08-10 – 2023-08-11 (×2): 1 via ORAL
  Filled 2023-08-09 (×2): qty 1

## 2023-08-09 MED ORDER — FENTANYL CITRATE (PF) 100 MCG/2ML IJ SOLN
INTRAMUSCULAR | Status: AC
  Filled 2023-08-09: qty 2

## 2023-08-09 MED ORDER — MORPHINE SULFATE (PF) 0.5 MG/ML IJ SOLN
INTRAMUSCULAR | Status: DC | PRN
  Administered 2023-08-09: 150 ug via INTRATHECAL

## 2023-08-09 MED ORDER — BUPIVACAINE IN DEXTROSE 0.75-8.25 % IT SOLN
INTRATHECAL | Status: DC | PRN
  Administered 2023-08-09: 12 mg via INTRATHECAL

## 2023-08-09 MED ORDER — SIMETHICONE 80 MG PO CHEW
80.0000 mg | CHEWABLE_TABLET | Freq: Three times a day (TID) | ORAL | Status: DC
  Administered 2023-08-09 – 2023-08-11 (×6): 80 mg via ORAL
  Filled 2023-08-09 (×6): qty 1

## 2023-08-09 MED ORDER — DIPHENHYDRAMINE HCL 50 MG/ML IJ SOLN: 12.5000 mg | INTRAMUSCULAR | Status: DC | PRN

## 2023-08-09 MED ORDER — ENOXAPARIN SODIUM 60 MG/0.6ML IJ SOSY
60.0000 mg | PREFILLED_SYRINGE | INTRAMUSCULAR | Status: DC
  Administered 2023-08-09 – 2023-08-10 (×2): 60 mg via SUBCUTANEOUS
  Filled 2023-08-09 (×2): qty 0.6

## 2023-08-09 MED ORDER — COCONUT OIL OIL: 1.0000 | TOPICAL_OIL | Status: DC | PRN

## 2023-08-09 MED ORDER — TRANEXAMIC ACID-NACL 1000-0.7 MG/100ML-% IV SOLN
INTRAVENOUS | Status: AC
  Filled 2023-08-09: qty 100

## 2023-08-09 MED ORDER — DIPHENHYDRAMINE HCL 25 MG PO CAPS
25.0000 mg | ORAL_CAPSULE | Freq: Four times a day (QID) | ORAL | Status: DC | PRN
Start: 2023-08-09 — End: 2023-08-11

## 2023-08-09 MED ORDER — TRANEXAMIC ACID-NACL 1000-0.7 MG/100ML-% IV SOLN
1000.0000 mg | Freq: Once | INTRAVENOUS | Status: AC
  Administered 2023-08-09: 1000 mg via INTRAVENOUS

## 2023-08-09 MED ORDER — KETOROLAC TROMETHAMINE 30 MG/ML IJ SOLN
30.0000 mg | Freq: Four times a day (QID) | INTRAMUSCULAR | Status: AC
  Administered 2023-08-09 – 2023-08-10 (×4): 30 mg via INTRAVENOUS
  Filled 2023-08-09 (×4): qty 1

## 2023-08-09 MED ORDER — SIMETHICONE 80 MG PO CHEW: 80.0000 mg | CHEWABLE_TABLET | ORAL | Status: DC | PRN

## 2023-08-09 MED ORDER — MENTHOL 3 MG MT LOZG: 1.0000 | LOZENGE | OROMUCOSAL | Status: DC | PRN

## 2023-08-09 MED ORDER — SODIUM CHLORIDE 0.9 % IR SOLN
Status: DC | PRN
  Administered 2023-08-09: 1000 mL

## 2023-08-09 MED ORDER — ONDANSETRON HCL 4 MG/2ML IJ SOLN
INTRAMUSCULAR | Status: AC
  Filled 2023-08-09: qty 2

## 2023-08-09 MED ORDER — LEVONORGESTREL 20 MCG/DAY IU IUD
INTRAUTERINE_SYSTEM | INTRAUTERINE | Status: AC
  Filled 2023-08-09: qty 1

## 2023-08-09 MED ORDER — FENTANYL CITRATE (PF) 100 MCG/2ML IJ SOLN
INTRAMUSCULAR | Status: DC | PRN
  Administered 2023-08-09: 15 ug via INTRATHECAL

## 2023-08-09 MED ORDER — ACETAMINOPHEN 10 MG/ML IV SOLN
INTRAVENOUS | Status: DC | PRN
  Administered 2023-08-09: 1000 mg via INTRAVENOUS

## 2023-08-09 MED ORDER — WITCH HAZEL-GLYCERIN EX PADS: 1.0000 | MEDICATED_PAD | CUTANEOUS | Status: DC | PRN

## 2023-08-09 MED ORDER — PHENYLEPHRINE HCL-NACL 20-0.9 MG/250ML-% IV SOLN
INTRAVENOUS | Status: DC | PRN
  Administered 2023-08-09: 60 ug/min via INTRAVENOUS

## 2023-08-09 MED ORDER — OXYTOCIN-SODIUM CHLORIDE 30-0.9 UT/500ML-% IV SOLN: 2.5000 [IU]/h | INTRAVENOUS | Status: AC

## 2023-08-09 MED ORDER — OXYCODONE HCL 5 MG PO TABS: 5.0000 mg | ORAL_TABLET | ORAL | Status: DC | PRN

## 2023-08-09 MED ORDER — MORPHINE SULFATE (PF) 0.5 MG/ML IJ SOLN
INTRAMUSCULAR | Status: AC
  Filled 2023-08-09: qty 10

## 2023-08-09 MED ORDER — ZOLPIDEM TARTRATE 5 MG PO TABS: 5.0000 mg | ORAL_TABLET | Freq: Every evening | ORAL | Status: DC | PRN

## 2023-08-09 MED ORDER — OXYTOCIN-SODIUM CHLORIDE 30-0.9 UT/500ML-% IV SOLN
INTRAVENOUS | Status: AC
  Filled 2023-08-09: qty 500

## 2023-08-09 MED ORDER — ONDANSETRON HCL 4 MG/2ML IJ SOLN
INTRAMUSCULAR | Status: DC | PRN
  Administered 2023-08-09: 4 mg via INTRAVENOUS

## 2023-08-09 MED ORDER — SODIUM CHLORIDE 0.9 % IV SOLN
INTRAVENOUS | Status: AC
  Filled 2023-08-09: qty 3

## 2023-08-09 MED ORDER — DIPHENHYDRAMINE HCL 25 MG PO CAPS: 25.0000 mg | ORAL_CAPSULE | ORAL | Status: DC | PRN

## 2023-08-09 MED ORDER — DEXAMETHASONE SODIUM PHOSPHATE 10 MG/ML IJ SOLN
INTRAMUSCULAR | Status: AC
  Filled 2023-08-09: qty 1

## 2023-08-09 MED ORDER — POVIDONE-IODINE 10 % EX SWAB
2.0000 | Freq: Once | CUTANEOUS | Status: AC
  Administered 2023-08-09: 2 via TOPICAL

## 2023-08-09 MED ORDER — ONDANSETRON HCL 4 MG/2ML IJ SOLN: 4.0000 mg | Freq: Three times a day (TID) | INTRAMUSCULAR | Status: DC | PRN

## 2023-08-09 MED ORDER — LEVONORGESTREL 20 MCG/DAY IU IUD
1.0000 | INTRAUTERINE_SYSTEM | INTRAUTERINE | Status: AC
  Administered 2023-08-09: 1 via INTRAUTERINE

## 2023-08-09 MED ORDER — OXYTOCIN-SODIUM CHLORIDE 30-0.9 UT/500ML-% IV SOLN
INTRAVENOUS | Status: DC | PRN
  Administered 2023-08-09: 30 [IU] via INTRAVENOUS

## 2023-08-09 MED ORDER — PHENYLEPHRINE HCL-NACL 20-0.9 MG/250ML-% IV SOLN
INTRAVENOUS | Status: AC
  Filled 2023-08-09: qty 250

## 2023-08-09 MED ORDER — IBUPROFEN 600 MG PO TABS
600.0000 mg | ORAL_TABLET | Freq: Four times a day (QID) | ORAL | Status: DC
  Administered 2023-08-10 – 2023-08-11 (×4): 600 mg via ORAL
  Filled 2023-08-09 (×4): qty 1

## 2023-08-09 MED ORDER — STERILE WATER FOR IRRIGATION IR SOLN
Status: DC | PRN
  Administered 2023-08-09: 1000 mL

## 2023-08-09 MED ORDER — SODIUM CHLORIDE 0.9 % IV SOLN
3.0000 g | INTRAVENOUS | Status: AC
  Administered 2023-08-09: 3 g via INTRAVENOUS

## 2023-08-09 MED ORDER — CEFAZOLIN SODIUM-DEXTROSE 2-4 GM/100ML-% IV SOLN: 2.0000 g | INTRAVENOUS | Status: DC

## 2023-08-09 MED ORDER — DIBUCAINE (PERIANAL) 1 % EX OINT: 1.0000 | TOPICAL_OINTMENT | CUTANEOUS | Status: DC | PRN

## 2023-08-09 MED ORDER — ACETAMINOPHEN 500 MG PO TABS
1000.0000 mg | ORAL_TABLET | Freq: Four times a day (QID) | ORAL | Status: DC
  Administered 2023-08-09 – 2023-08-11 (×8): 1000 mg via ORAL
  Filled 2023-08-09 (×8): qty 2

## 2023-08-09 MED ORDER — PANTOPRAZOLE SODIUM 40 MG PO TBEC
40.0000 mg | DELAYED_RELEASE_TABLET | Freq: Every day | ORAL | Status: DC
  Administered 2023-08-10 – 2023-08-11 (×2): 40 mg via ORAL
  Filled 2023-08-09 (×2): qty 1

## 2023-08-09 MED ORDER — SCOPOLAMINE 1 MG/3DAYS TD PT72: 1.0000 | MEDICATED_PATCH | Freq: Once | TRANSDERMAL | Status: DC

## 2023-08-09 SURGICAL SUPPLY — 37 items
BENZOIN TINCTURE PRP APPL 2/3 (GAUZE/BANDAGES/DRESSINGS) IMPLANT
CHLORAPREP W/TINT 26 (MISCELLANEOUS) ×2 IMPLANT
CLAMP UMBILICAL CORD (MISCELLANEOUS) ×1 IMPLANT
CLOTH BEACON ORANGE TIMEOUT ST (SAFETY) ×1 IMPLANT
DERMABOND ADVANCED .7 DNX12 (GAUZE/BANDAGES/DRESSINGS) ×2 IMPLANT
DRSG OPSITE POSTOP 4X10 (GAUZE/BANDAGES/DRESSINGS) ×1 IMPLANT
ELECT REM PT RETURN 9FT ADLT (ELECTROSURGICAL) ×1
ELECTRODE REM PT RTRN 9FT ADLT (ELECTROSURGICAL) ×1 IMPLANT
EXTRACTOR VACUUM KIWI (MISCELLANEOUS) ×1 IMPLANT
GAUZE PAD ABD 7.5X8 STRL (GAUZE/BANDAGES/DRESSINGS) IMPLANT
GAUZE SPONGE 4X4 12PLY STRL LF (GAUZE/BANDAGES/DRESSINGS) IMPLANT
GLOVE BIOGEL PI IND STRL 7.0 (GLOVE) ×2 IMPLANT
GLOVE BIOGEL PI IND STRL 7.5 (GLOVE) ×2 IMPLANT
GLOVE ECLIPSE 7.5 STRL STRAW (GLOVE) ×1 IMPLANT
GOWN STRL REUS W/TWL LRG LVL3 (GOWN DISPOSABLE) ×3 IMPLANT
KIT ABG SYR 3ML LUER SLIP (SYRINGE) IMPLANT
MAT PREVALON FULL STRYKER (MISCELLANEOUS) IMPLANT
NDL HYPO 25X5/8 SAFETYGLIDE (NEEDLE) IMPLANT
NEEDLE HYPO 25X5/8 SAFETYGLIDE (NEEDLE)
NS IRRIG 1000ML POUR BTL (IV SOLUTION) ×1 IMPLANT
PACK C SECTION WH (CUSTOM PROCEDURE TRAY) ×1 IMPLANT
PAD OB MATERNITY 4.3X12.25 (PERSONAL CARE ITEMS) ×1 IMPLANT
RETRACTOR TRAXI PANNICULUS (MISCELLANEOUS) IMPLANT
RTRCTR C-SECT PINK 25CM LRG (MISCELLANEOUS) ×1 IMPLANT
STRIP CLOSURE SKIN 1/2X4 (GAUZE/BANDAGES/DRESSINGS) IMPLANT
SUT MNCRL 0 VIOLET CTX 36 (SUTURE) ×2 IMPLANT
SUT MNCRL AB 3-0 PS2 27 (SUTURE) ×1 IMPLANT
SUT MNCRL AB 4-0 PS2 18 (SUTURE) ×1 IMPLANT
SUT VIC AB 0 CT1 27XBRD ANBCTR (SUTURE) ×1 IMPLANT
SUT VIC AB 0 CTX36XBRD ANBCTRL (SUTURE) ×1 IMPLANT
SUT VIC AB 2-0 CT1 TAPERPNT 27 (SUTURE) ×1 IMPLANT
SUT VIC AB 4-0 KS 27 (SUTURE) IMPLANT
SUT VIC AB 4-0 PS2 27 (SUTURE) ×1 IMPLANT
SUTURE PLAIN GUT 2.0 ETHICON (SUTURE) IMPLANT
TOWEL OR 17X24 6PK STRL BLUE (TOWEL DISPOSABLE) ×1 IMPLANT
TRAY FOLEY W/BAG SLVR 14FR LF (SET/KITS/TRAYS/PACK) ×1 IMPLANT
WATER STERILE IRR 1000ML POUR (IV SOLUTION) ×1 IMPLANT

## 2023-08-09 NOTE — Anesthesia Procedure Notes (Signed)
Spinal  Patient location during procedure: OB Start time: 08/09/2023 9:16 AM End time: 08/09/2023 9:23 AM Reason for block: surgical anesthesia Staffing Performed: anesthesiologist  Anesthesiologist: Trevor Iha, MD Performed by: Trevor Iha, MD Authorized by: Trevor Iha, MD   Preanesthetic Checklist Completed: patient identified, IV checked, risks and benefits discussed, surgical consent, monitors and equipment checked, pre-op evaluation and timeout performed Spinal Block Patient position: sitting Prep: DuraPrep and site prepped and draped Patient monitoring: heart rate, cardiac monitor, continuous pulse ox and blood pressure Approach: midline Location: L3-4 Injection technique: single-shot Needle Needle type: Pencan  Needle gauge: 24 G Needle length: 10 cm Needle insertion depth: 7 cm Assessment Sensory level: T4 Events: CSF return Additional Notes 3 Attempt (s). Pt tolerated procedure well.

## 2023-08-09 NOTE — Anesthesia Preprocedure Evaluation (Addendum)
Anesthesia Evaluation  Patient identified by MRN, date of birth, ID band Patient awake    Reviewed: Allergy & Precautions, NPO status , Patient's Chart, lab work & pertinent test results  Airway Mallampati: II  TM Distance: >3 FB Neck ROM: Full    Dental no notable dental hx. (+) Teeth Intact, Dental Advisory Given   Pulmonary former smoker   Pulmonary exam normal breath sounds clear to auscultation       Cardiovascular hypertension, Normal cardiovascular exam Rhythm:Regular Rate:Normal     Neuro/Psych    GI/Hepatic   Endo/Other    Renal/GU      Musculoskeletal   Abdominal   Peds  Hematology Lab Results      Component                Value               Date                      WBC                      9.5                 08/07/2023                HGB                      11.8 (L)            08/07/2023                HCT                      36.7                08/07/2023                MCV                      88.0                08/07/2023                PLT                      303                 08/07/2023              Anesthesia Other Findings   Reproductive/Obstetrics (+) Pregnancy                              Anesthesia Physical Anesthesia Plan  ASA: 3  Anesthesia Plan: Spinal   Post-op Pain Management: Regional block* and Minimal or no pain anticipated   Induction:   PONV Risk Score and Plan: 3 and Treatment may vary due to age or medical condition, Ondansetron and Dexamethasone  Airway Management Planned: Natural Airway and Simple Face Mask  Additional Equipment: None  Intra-op Plan:   Post-operative Plan:   Informed Consent: I have reviewed the patients History and Physical, chart, labs and discussed the procedure including the risks, benefits and alternatives for the proposed anesthesia with the patient or authorized representative who has indicated his/her  understanding and acceptance.     Dental advisory given  Plan  Discussed with: CRNA and Anesthesiologist  Anesthesia Plan Comments: (30 wk G2P1 w BMI 44.3 for repeat c/s )        Anesthesia Quick Evaluation

## 2023-08-09 NOTE — H&P (Signed)
OBSTETRIC ADMISSION HISTORY AND PHYSICAL  Melody Stewart is a 38 y.o. female G2P1001 with IUP at [redacted]w[redacted]d by LMP c/w 11wk [place] presenting for scheduled CS.   No complaints this AM.   She plans on breast feeding. She request postplacental IUD for birth control. She received her prenatal care at [facility]   Dating: By LMP c/w 11wk YS --->  Estimated Date of Delivery: 08/16/23  Prenatal History/Complications:  - Prior CS x 1 - for chorio/failed IOL - Anxiety/depression - Hx preE w/ SF - normotensive this pregnancy - Asthma  Past Medical History: Past Medical History:  Diagnosis Date   Anemia    Anxiety and depression 09/14/2015   Body aches    Dysuria    Family history of adverse reaction to anesthesia    mother-- hard to wake   Fibroadenoma of right breast    Frequency of urination    GERD (gastroesophageal reflux disease)    Hydronephrosis 03/24/2018   Bilateral, Right-mild to moderate, Left mild, noted on US renal   IBS (irritable bowel syndrome)    Mild asthma    Nephrolithiasis    Pre-eclampsia 03/2018   Severe   Urgency of urination    UTI (urinary tract infection)    Vaginal Pap smear, abnormal     Past Surgical History: Past Surgical History:  Procedure Laterality Date   CESAREAN SECTION N/A 04/17/2018   Procedure: CESAREAN SECTION;  Surgeon: Willodean Rosenthal, MD;  Location: [facility] BIRTHING SUIT[facility];  Service: Obstetrics;  Laterality: N/A;   CYSTOSCOPY W/ URETERAL STENT PLACEMENT Bilateral 03/16/2018   Procedure: CYSTOSCOPY WITH RETROGRADE PYELOGRAM/URETERAL STENT PLACEMENT;  Surgeon: Jerilee Field, MD;  Location: [facility] ORS;  Service: Urology;  Laterality: Bilateral;   CYSTOSCOPY/URETEROSCOPY/HOLMIUM LASER/STENT PLACEMENT Bilateral 05/06/2018   Procedure: CYSTOSCOPY/URETEROSCOPY/HOLMIUM LASER/STENT EXCHANGE STONE BASKET RETRIVAL;  Surgeon: Jerilee Field, MD;  Location: [facility];  Service: Urology;  Laterality: Bilateral;   KNEE SURGERY Left  age 47   TONSILLECTOMY     WISDOM TOOTH EXTRACTION      Obstetrical History: OB History     Gravida  2   Para  1   Term  1   Preterm  0   AB  0   Living  1      SAB  0   IAB  0   Ectopic  0   Multiple  0   Live Births              Social History Social History   Socioeconomic History   Marital status: Single    Spouse name: Not on file   Number of children: 1   Years of education: Not on file   Highest education level: Not on file  Occupational History   Not on file  Tobacco Use   Smoking status: Former    Current packs/day: 0.00    Types: Cigarettes    Start date: 05/02/2012    Quit date: 05/02/2014    Years since quitting: 9.2   Smokeless tobacco: Never  Vaping Use   Vaping status: Never Used  Substance and Sexual Activity   Alcohol use: Not Currently   Drug use: Never   Sexual activity: Yes    Birth control/protection: None  Other Topics Concern   Not on file  Social History Narrative   Not on file   Social Drivers of Health   Financial Resource Strain: Medium Risk (02/19/2023)   Overall Financial Resource Strain (CARDIA)    Difficulty of  Paying Living Expenses: Somewhat hard  Food Insecurity: No Food Insecurity (08/09/2023)   Hunger Vital Sign    Worried About Running Out of Food in the Last Year: Never true    Ran Out of Food in the Last Year: Never true  Transportation Needs: No Transportation Needs (08/09/2023)   PRAPARE - Administrator, Civil Service (Medical): No    Lack of Transportation (Non-Medical): No  Physical Activity: Inactive (02/19/2023)   Exercise Vital Sign    Days of Exercise per Week: 0 days    Minutes of Exercise per Session: 0 min  Stress: No Stress Concern Present (02/19/2023)   [employer] of Occupational Health - Occupational Stress Questionnaire    Feeling of Stress : Only a little  Social Connections: Moderately Isolated (02/19/2023)   Social Connection and Isolation Panel [NHANES]     Frequency of Communication with Friends and Family: More than three times a week    Frequency of Social Gatherings with Friends and Family: Twice a week    Attends Religious Services: 1 to 4 times per year    Active Member of [employer] or Organizations: No    Attends Engineer, structural: Never    Marital Status: Never married    Family History: Family History  Problem Relation Age of Onset   Hypertension Mother    Other Mother        mother had child hydrocephic-died   Hypertension Father    Heart disease Paternal Grandfather    Stroke Paternal Grandfather    Thyroid disease Maternal Grandmother    Heart disease Maternal Grandmother    Cancer Paternal Grandmother        breast   Thyroid disease Maternal Aunt    Cancer Paternal Aunt        breast    Allergies: Allergies  Allergen Reactions   Hydrocodone Other (See Comments)    Make her sick and constipated.    Medications Prior to Admission  Medication Sig Dispense Refill Last Dose/Taking   acetaminophen (TYLENOL) 500 MG tablet Take 1,000 mg by mouth every 6 (six) hours as needed for moderate pain (pain score 4-6).   Taking As Needed   cetirizine (ZYRTEC) 10 MG tablet Take 10 mg by mouth daily.   Taking   Omeprazole 20 MG TBEC Take 1 tablet (20 mg total) by mouth daily. 30 tablet 3 Taking   Pediatric Multivit-Minerals (FLINTSTONES GUMMIES PO) Take 2 capsules by mouth daily.   Taking   aspirin EC 81 MG tablet Take 2 tablets (162 mg total) by mouth daily. Swallow whole. (Patient not taking: Reported on 08/06/2023) 180 tablet 2 Not Taking     Review of Systems   All systems reviewed and negative except as stated in HPI  Blood pressure 127/75, pulse (!) 103, temperature 98.3 F (36.8 C), temperature source Oral, resp. rate 18, height 5\' 6"  (1.676 m), weight 121.6 kg, last menstrual period 11/09/2022. General appearance: alert, cooperative, and no distress Lungs:normal effort Heart: mild tachycardia Abdomen: soft,  non-tender Extremities: Homans sign is negative, no sign of DVT  Prenatal labs: ABO, Rh: --/--/B POS (01/15 1914) Antibody: NEG (01/15 0917) Rubella: 6.43 (07/30 1624) RPR: NON REACTIVE (01/15 0914)  HBsAg: Negative (07/30 1624)  HIV: Non Reactive (10/30 0840)  GBS: Negative/-- (12/31 1650)  2 hr GTT normal Genetic screening  LR NIPS Anatomy US normal  Prenatal Transfer Tool  Maternal Diabetes: No Genetic Screening: Normal Maternal Ultrasounds/Referrals: Normal Fetal Ultrasounds or other  Referrals:  None Maternal Substance Abuse:  No Significant Maternal Medications:  None Significant Maternal Lab Results:  None Number of Prenatal Visits:greater than 3 verified prenatal visits Other Comments:  None  No results found for this or any previous visit (from the past 24 hours).  Patient Active Problem List   Diagnosis Date Noted   Intrauterine normal pregnancy 08/09/2023   Asthma 06/26/2023   Previous cesarean section 02/19/2023   H/O severe pre-eclampsia 02/19/2023   Encounter for supervision of normal pregnancy, antepartum 02/14/2023   Protrusion of lumbar intervertebral disc 02/22/2022   Trochanteric bursitis, left hip 02/22/2022   AKI (acute kidney injury) (HCC) 03/15/2018   Anxiety and depression 09/14/2015    Assessment/Plan:  Melody Stewart is a 38 y.o. G2P1001 at [redacted]w[redacted]d here for scheduled repeat CS with postplacental Mirena IUD  Reviewed option for TOLAC vs repeat cesarean. She confirms her desire for repeat cesarean  The risks of surgery were discussed with the patient including but were not limited to: bleeding which may require transfusion, reoperation, or additional procedures like hysterectomy in the event of life-threatening hemorrhage; infection which may require antibiotics; injury to bowel, bladder, ureters or other surrounding organs; injury to the fetus; formation of adhesions; placental abnormalities with subsequent pregnancies; incisional problems;  thromboembolic phenomenon and other postoperative/anesthesia complications.    The risks of postplacental Mirena IUD were reviewed including malposition, expulsion, irregular bleeding, infection, contraceptive failure. Reviewed lower risk of malposition/expulsion with office placement in 6 weeks. She confirms her desire for postplacental placement.   The patient concurred with the proposed plan, giving informed written consent for the procedure.   Patient has been NPO since midnight and she will remain NPO for procedure. Anesthesia and OR aware. Preoperative prophylactic antibiotics and SCDs ordered on call to the OR. To OR when ready.  Lennart Pall, MD  08/09/2023, 9:00 AM

## 2023-08-09 NOTE — Lactation Note (Signed)
This note was copied from a baby's chart. Lactation Consultation Note  Patient Name: Melody Stewart Today's Date: 08/09/2023 Age:39 hours Reason for consult: Follow-up assessment;Mother's request;Term;Breastfeeding assistance  P2- MOB requested LC's presence to perform a latch assessment. When LC got to the room, MOB had just finished nursing infant for 15 minutes. LC encouraged MOB to call right before a feeding instead of when she is finishing a feeding. MOB reports that feedings have been going well so far. MOB plans to call before the start of infant's next feeding.  Maternal Data Has patient been taught Hand Expression?: Yes Does the patient have breastfeeding experience prior to this delivery?: Yes How long did the patient breastfeed?: 6 weeks  Feeding Mother's Current Feeding Choice: Breast Milk  Lactation Tools Discussed/Used Pump Education: Milk Storage  Interventions Interventions: Breast feeding basics reviewed;Education;LC Services brochure  Discharge Discharge Education: Warning signs for feeding baby Pump: DEBP;Personal  Consult Status Consult Status: Follow-up Date: 08/10/23 Follow-up type: In-patient    Dema Severin BS, IBCLC 08/09/2023, 9:17 PM

## 2023-08-09 NOTE — Lactation Note (Signed)
This note was copied from a baby's chart. Lactation Consultation Note  Patient Name: Melody Stewart Today's Date: 08/09/2023 Age:39 hours Reason for consult: Initial assessment;1st time breastfeeding;Term  P2, 39 wks, @ 4 hrs of life. Infant asleep on mom- per mom just finished feeding - 13 minutes. Discussed starting with hand expression, demonstrated for mom- several easy drops visualized. Discussed holds, steps of latching, and encouraged working on big mouth latch. Encouraged feeding every 3 hours- short duration feeds or hand expression onto a spoon and feed off spoon is great on sleepy first day. Highlighted progression through first days @ breast readiness- day 1- sleepy, then more awake/ feeding cues day 2, and cluster feeding overnight to bring milk in. Highlighted belly size- spoon, small meals are perfect while baby learning breast. Mom has pump @ home. Handouts on LC services and milk storage shared with mom. Encouraged mom to call for assist as desired.   Maternal Data Has patient been taught Hand Expression?: Yes Does the patient have breastfeeding experience prior to this delivery?: Yes (Baby didn't latch well,moved to pumping, had Kidney stones, had to pump & dump and milk dried up- 6 weeks-) How long did the patient breastfeed?: 6 wks/ pumping  Feeding Mother's Current Feeding Choice: Breast Milk  LATCH Score Latch: Repeated attempts needed to sustain latch, nipple held in mouth throughout feeding, stimulation needed to elicit sucking reflex.  Audible Swallowing: None  Type of Nipple: Everted at rest and after stimulation  Comfort (Breast/Nipple): Soft / non-tender  Hold (Positioning): Assistance needed to correctly position infant at breast and maintain latch.  LATCH Score: 6   Lactation Tools Discussed/Used    Interventions    Discharge Pump: DEBP;Personal (Medela)  Consult Status Consult Status: Follow-up Date: 08/10/23 Follow-up type:  In-patient    [facility] 08/09/2023, 2:44 PM

## 2023-08-09 NOTE — Anesthesia Postprocedure Evaluation (Signed)
Anesthesia Post Note  Patient: Glass blower/designer  Procedure(s) Performed: CESAREAN SECTION INTRAUTERINE DEVICE (IUD) INSERTION     Patient location during evaluation: Mother Baby Anesthesia Type: Spinal Level of consciousness: oriented and awake and alert Pain management: pain level controlled Vital Signs Assessment: post-procedure vital signs reviewed and stable Respiratory status: spontaneous breathing and respiratory function stable Cardiovascular status: blood pressure returned to baseline and stable Postop Assessment: no headache, no backache, no apparent nausea or vomiting and able to ambulate Anesthetic complications: no   No notable events documented.  Last Vitals:  Vitals:   08/09/23 1209 08/09/23 1256  BP: 110/77 115/71  Pulse: 85 80  Resp: 18 17  Temp: 36.9 C   SpO2:  98%    Last Pain:  Vitals:   08/09/23 1256  TempSrc:   PainSc: 0-No pain   Pain Goal:                   Trevor Iha

## 2023-08-09 NOTE — Discharge Summary (Signed)
Postpartum Discharge Summary  Date of Service updated***     Patient Name: Melody Stewart DOB: [birth year] MRN: [id number]  Date of admission: 08/09/2023 Delivery date:08/09/2023 Delivering provider: Lennart Pall Date of discharge: 08/09/2023  Admitting diagnosis: Intrauterine normal pregnancy [Z34.90] Intrauterine pregnancy: [redacted]w[redacted]d     Secondary diagnosis:  Active Problems:   S/P cesarean section  Additional problems: anxiety/depression (no meds), history of preE w/ SF, asthma    Discharge diagnosis: Term Pregnancy Delivered                                              Post partum procedures: Postplacental Mirena IUD Augmentation: N/A Complications: None  Hospital course: Sceduled C/S   38 y.o. yo G2P2002 at [redacted]w[redacted]d was admitted to the hospital 08/09/2023 for scheduled cesarean section with the following indication:Elective Repeat.Delivery details are as follows:  Membrane Rupture Time/Date: 9:45 AM,08/09/2023  Delivery Method:C-Section, Low Transverse Operative Delivery:N/A Details of operation can be found in separate operative note.  Patient had a postpartum course complicated by***.  She is ambulating, tolerating a regular diet, passing flatus, and urinating well. Patient is discharged home in stable condition on  08/09/23        Newborn Data: Birth date:08/09/2023 Birth time:9:46 AM Gender:Female Living status:Living Apgars:8 ,9  Weight:3500 g    Magnesium Sulfate received: {Mag received:30440022} BMZ received: No Rhophylac:N/A MMR:N/A T-DaP:Given prenatally Flu: Yes RSV Vaccine received: Yes Transfusion:{Transfusion received:30440034}  Immunizations received: Immunization History  Administered Date(s) Administered   Influenza, Seasonal, Injecte, Preservative Fre 05/22/2023   Influenza,inj,Quad PF,6+ Mos 09/25/2017, 04/02/2018   Rsv, Bivalent, Protein Subunit Rsvpref,pf Verdis Frederickson) 07/23/2023   Tdap 03/05/2018, 06/12/2023    Physical exam  Vitals:    08/09/23 1100 08/09/23 1115 08/09/23 1209 08/09/23 1256  BP: (!) 110/59 98/67 110/77 115/71  Pulse: 83 89 85 80  Resp: 17 (!) 26 18 17   Temp:   98.4 F (36.9 C)   TempSrc:   Oral   SpO2: 100% 97%  98%  Weight:      Height:       General: {Exam; general:21111117} Lochia: {Desc; appropriate/inappropriate:30686::"appropriate"} Uterine Fundus: {Desc; firm/soft:30687} Incision: {Exam; incision:21111123} DVT Evaluation: {Exam; dvt:2111122} Labs: Lab Results  Component Value Date   WBC 9.5 08/07/2023   HGB 11.8 (L) 08/07/2023   HCT 36.7 08/07/2023   MCV 88.0 08/07/2023   PLT 303 08/07/2023      Latest Ref Rng & Units 08/09/2023   11:58 AM  CMP  Creatinine 0.44 - 1.00 mg/dL 1.66    Edinburgh Score:    05/23/2018   11:17 AM  Edinburgh Postnatal Depression Scale Screening Tool  I have been able to laugh and see the funny side of things. 0  I have looked forward with enjoyment to things. 0  I have blamed myself unnecessarily when things went wrong. 0  I have been anxious or worried for no good reason. 0  I have felt scared or panicky for no good reason. 2  Things have been getting on top of me. 1  I have been so unhappy that I have had difficulty sleeping. 0  I have felt sad or miserable. 0  I have been so unhappy that I have been crying. 1  The thought of harming myself has occurred to me. 0  Edinburgh Postnatal Depression Scale Total 4   No data  recorded  After visit meds:  Allergies as of 08/09/2023       Reactions   Hydrocodone Other (See Comments)   Make her sick and constipated.     Med Rec must be completed prior to using this [facility]***        Discharge home in stable condition Infant Feeding: Breast Infant Disposition:{CHL IP OB HOME WITH ZOXWRU:04540} Discharge instruction: per After Visit Summary and Postpartum booklet. Activity: Advance as tolerated. Pelvic rest for 6 weeks.  Diet: routine diet Future Appointments:No future appointments. Follow  up Visit:  Message sent to FT by [facility] 1/17 Please schedule this patient for a In person postpartum visit in 4 weeks with the following provider: Any provider. Additional Postpartum F/U:Postpartum Depression checkup and Incision check 1 week  Low risk pregnancy complicated by:  prior CS, history of severe preE Delivery mode:  C-Section, Low Transverse Anticipated Birth Control:  PP IUD placed   08/09/2023 Lennart Pall, MD

## 2023-08-09 NOTE — Transfer of Care (Signed)
Immediate Anesthesia Transfer of Care Note  Patient: Melody Stewart  Procedure(s) Performed: CESAREAN SECTION INTRAUTERINE DEVICE (IUD) INSERTION  Patient Location: PACU  Anesthesia Type:Spinal  Level of Consciousness: awake, alert , and oriented  Airway & Oxygen Therapy: Patient Spontanous Breathing  Post-op Assessment: Report given to RN and Post -op Vital signs reviewed and stable  Post vital signs: Reviewed and stable  Last Vitals:  Vitals Value Taken Time  BP 107/61 08/09/23 1034  Temp    Pulse 95 08/09/23 1040  Resp 18 08/09/23 1040  SpO2 100 % 08/09/23 1040  Vitals shown include unfiled device data.  Last Pain:  Vitals:   08/09/23 0754  TempSrc: Oral  PainSc: 2          Complications: No notable events documented.

## 2023-08-09 NOTE — Lactation Note (Signed)
This note was copied from a baby's chart. Lactation Consultation Note  Patient Name: Melody Stewart [id number] Date: 08/09/2023 Age:39 hours Reason for consult: Follow-up assessment;Mother's request;Term;Breastfeeding assistance;RN request  P2- MOB requested LC to assess infant latch. MOB placed infant on the left breast in the cross cradle hold. Infant latched immediately. LC noted that infant tightly tucked her lips in and was chomping. No nutritive sucking was noted at this time. LC demonstrated how to flange infant's lips. Infant nursed this way for 8 minutes before stopping. LC encouraged MOB to watch how infant is latching and sucking to take note if she starts causing nipple trauma due to the uncoordinated suck. LC encouraged the use of a paci while infant is not hungry to assist with proper tongue and jaw movement for tonight. MOB had brought circular shaped ones from home, so LC gave it to infant. LC encouraged MOB to call for [facility] services for next feeding again to see if the use of the paci helps at all with the coordination skills. MOB verbalized agreement.  Maternal Data Has patient been taught Hand Expression?: Yes Does the patient have breastfeeding experience prior to this delivery?: Yes How long did the patient breastfeed?: 6 weeks  Feeding Mother's Current Feeding Choice: Breast Milk  LATCH Score Latch: Repeated attempts needed to sustain latch, nipple held in mouth throughout feeding, stimulation needed to elicit sucking reflex.  Audible Swallowing: A few with stimulation  Type of Nipple: Everted at rest and after stimulation  Comfort (Breast/Nipple): Soft / non-tender  Hold (Positioning): Assistance needed to correctly position infant at breast and maintain latch.  LATCH Score: 7   Lactation Tools Discussed/Used Pump Education: Milk Storage  Interventions Interventions: Breast feeding basics reviewed;Assisted with latch;Hand express;Breast compression;Adjust  position;Support pillows;Position options;Education;LC Services brochure  Discharge Discharge Education: Warning signs for feeding baby Pump: DEBP;Personal  Consult Status Consult Status: Follow-up Date: 08/10/23 Follow-up type: In-patient    Dema Severin BS, IBCLC 08/09/2023, 11:08 PM

## 2023-08-09 NOTE — Op Note (Addendum)
Melody Stewart PROCEDURE DATE: 08/09/2023  PREOPERATIVE DIAGNOSES: Intrauterine pregnancy at [redacted]w[redacted]d weeks gestation;  elective repeat (1 prior CS), desire for contraception  POSTOPERATIVE DIAGNOSES: The same  PROCEDURE: Repeat Low Transverse Cesarean Section, Insertion of Postplacental Mirena IUD  SURGEON:  Dr. Harvie Bridge  ASSISTANT:  Dr. Wylene Simmer  ANESTHESIOLOGY TEAM: Anesthesiologist: Trevor Iha, MD CRNA: Elgie Congo, CRNA Student Nurse Anesthetist: Artist Beach, RN  INDICATIONS: Melody Stewart is a 38 y.o. [phone] at [redacted]w[redacted]d here for cesarean section secondary to the indications listed under preoperative diagnoses; please see preoperative note for further details.  The risks of surgery were discussed with the patient including but were not limited to: bleeding which may require transfusion or reoperation; infection which may require antibiotics; injury to bowel, bladder, ureters or other surrounding organs; injury to the fetus; need for additional procedures including hysterectomy in the event of a life-threatening hemorrhage; formation of adhesions; placental abnormalities wth subsequent pregnancies; incisional problems; thromboembolic phenomenon and other postoperative/anesthesia complications.  The patient concurred with the proposed plan, giving informed written consent for the procedure.    FINDINGS:  Viable female infant in cephalic presentation.  Apgars 8 and 9.  Amniotic fluid: clear.  Intact placenta, three vessel cord.  Normal uterus and fallopian tubes bilaterally. No significant intraperitoneal adhesive disease. Mild adhesive disease between fascia, rectus, omentum and peritoneum.   ANESTHESIA: spinal INTRAVENOUS FLUIDS: 1200 ml   ESTIMATED BLOOD LOSS: 639 ml URINE OUTPUT:  100 ml SPECIMENS: Placenta sent to L&D  COMPLICATIONS: None immediate  PROCEDURE IN DETAIL:  The patient preoperatively received intravenous antibiotics and had sequential  compression devices applied to her lower extremities.  She was then taken to the operating room where spinal anesthesia was found to be adequate. She was then placed in a dorsal supine position with a leftward tilt, and prepped and draped in a sterile manner.  A foley catheter was  placed into her bladder and attached to constant gravity.  After an adequate timeout was performed, a Pfannenstiel skin incision was made with scalpel and carried through to the underlying layer of fascia. The fascia was incised in the midline, and this incision was extended bluntly. The rectus muscles were separated in the midline and the peritoneum was entered bluntly.   The Alexis self-retaining retractor was introduced into the abdominal cavity.  Attention was turned to the lower uterine segment where a low transverse hysterotomy was made with a scalpel and extended bilaterally bluntly.  The infant was successfully delivered, the cord was clamped and cut after one minute, and the infant was handed over to the awaiting neonatology team. Uterine massage was then administered, and the placenta delivered intact with a three-vessel cord. The uterus was then cleared of clots and debris.  The hysterotomy was closed with 0 Monocryl in a running fashion.  After the incision was halfway closed, the Mirena IUD was removed from the package and strings were trimmed. A Tresa Endo was used to place the IUD at the fundus and guide strings towards the cervix. The hysterotomy was closed. An additional figure-of-eight 0 Monocryl serosal stitches were placed to help with hemostasis.    The pelvis was cleared of all clot and debris. Hemostasis was confirmed on all surfaces.  The retractor was removed. The fascia was then closed using 0 Vicryl in a running fashion.  The subcutaneous layer was irrigated, and was found to be hemostatic, any areas of bleeding were cauterized with the bovie, and was reapproximated with 2-0  plain gut in a running fashion. The  skin was closed with a 4-0 vicryl subcuticular stitch. The patient tolerated the procedure well. Sponge, instrument and needle counts were correct x 3.  She was taken to the recovery room in stable condition.   Harvie Bridge, MD Obstetrician & Gynecologist, [facility] for [employer], [facility]

## 2023-08-10 LAB — CBC
HCT: 32.6 % — ABNORMAL LOW (ref 36.0–46.0)
Hemoglobin: 10.9 g/dL — ABNORMAL LOW (ref 12.0–15.0)
MCH: 28.6 pg (ref 26.0–34.0)
MCHC: 33.4 g/dL (ref 30.0–36.0)
MCV: 85.6 fL (ref 80.0–100.0)
Platelets: 302 10*3/uL (ref 150–400)
RBC: 3.81 MIL/uL — ABNORMAL LOW (ref 3.87–5.11)
RDW: 13.9 % (ref 11.5–15.5)
WBC: 15.3 10*3/uL — ABNORMAL HIGH (ref 4.0–10.5)
nRBC: 0 % (ref 0.0–0.2)

## 2023-08-10 MED ORDER — ONDANSETRON HCL 4 MG PO TABS
4.0000 mg | ORAL_TABLET | Freq: Three times a day (TID) | ORAL | Status: DC | PRN
  Administered 2023-08-10: 4 mg via ORAL
  Filled 2023-08-10: qty 1

## 2023-08-10 NOTE — Lactation Note (Signed)
This note was copied from a baby's chart. Lactation Consultation Note  Patient Name: Melody Stewart [id number] Date: 08/10/2023 Age:39 hours Reason for consult: Mother's request;Difficult latch;Term MOB attempted to latch infant on her right breast using the football hold and cross cradle position, infant latched but held nipple in mouth did not elicit the suck and swallow response. MOB hand expressed and gave infant 6 mls of colostrum by spoon. MOB was doing skin to skin as LC left the room. MOB will continue to work towards infant actively feeding at the breast. MOB knows if infant does not latch to hand express and give infant back colostrum by spoon. MOB knows to continue to call for latch assistance if needed. MOB will continue to BF infant by cues, on demand, every 2-3 hours, skin to skin. MOB will delay pacifier usage until 3 weeks postpartum.   Maternal Data Has patient been taught Hand Expression?: Yes Does the patient have breastfeeding experience prior to this delivery?: Yes How long did the patient breastfeed?: 6 weeks  Feeding Mother's Current Feeding Choice: Breast Milk  LATCH Score Latch: Too sleepy or reluctant, no latch achieved, no sucking elicited.  Audible Swallowing: None  Type of Nipple: Everted at rest and after stimulation  Comfort (Breast/Nipple): Soft / non-tender  Hold (Positioning): Assistance needed to correctly position infant at breast and maintain latch.  LATCH Score: 5   Lactation Tools Discussed/Used Pump Education: Milk Storage  Interventions Interventions: Skin to skin;Assisted with latch;Hand express;Position options;Expressed milk;Support pillows;Adjust position;Breast compression;Education  Discharge Discharge Education: Warning signs for feeding baby Pump: DEBP;Personal  Consult Status Consult Status: Follow-up Date: 08/10/23 Follow-up type: In-patient    Frederico Hamman 08/10/2023, 12:38 AM

## 2023-08-10 NOTE — Progress Notes (Signed)
POSTPARTUM PROGRESS NOTE  POD #1  Subjective:  Melody Stewart is a 38 y.o. [id number] s/p rLTCS with IUD at [redacted]w[redacted]d. Today she notes no acute complaints. She denies any problems with ambulating, voiding or po intake. Denies nausea or vomiting. She has passed flatus, no BM.  Pain is well controlled.  Lochia minimal Denies fever/chills/chest pain/SOB.  Overall doing well and reports no acute complaints  Objective: Blood pressure 116/60, pulse 85, temperature 98.9 F (37.2 C), temperature source Oral, resp. rate 18, height 5\' 6"  (1.676 m), weight 121.6 kg, last menstrual period 11/09/2022, SpO2 99%, unknown if currently breastfeeding.  Physical Exam:  General: alert, cooperative and no distress Chest: no respiratory distress Heart: regular rate and rhythm Abdomen: soft, nontender, BS quiet Uterine Fundus: firm, appropriately tender Incision: C/D/I DVT Evaluation: No calf swelling or tenderness Extremities: 1+ edema Skin: warm, dry  Results for orders placed or performed during the hospital encounter of 08/09/23 (from the past 24 hours)  Creatinine, serum     Status: None   Collection Time: 08/09/23 11:58 AM  Result Value Ref Range   Creatinine, Ser 0.77 0.44 - 1.00 mg/dL   GFR, Estimated >62 >13 mL/min  CBC     Status: Abnormal   Collection Time: 08/10/23  4:27 AM  Result Value Ref Range   WBC 15.3 (H) 4.0 - 10.5 K/uL   RBC 3.81 (L) 3.87 - 5.11 MIL/uL   Hemoglobin 10.9 (L) 12.0 - 15.0 g/dL   HCT 08.6 (L) 57.8 - 46.9 %   MCV 85.6 80.0 - 100.0 fL   MCH 28.6 26.0 - 34.0 pg   MCHC 33.4 30.0 - 36.0 g/dL   RDW 62.9 52.8 - 41.3 %   Platelets 302 150 - 400 K/uL   nRBC 0.0 0.0 - 0.2 %    Assessment/Plan: Melody Stewart is a 38 y.o. [id number] s/p rLTCS with IUD at [redacted]w[redacted]d POD#1  -pain well controlled -tolerating regular diet -encourage ambulation -Lovenox for DVT prophylaxis Contraception: IUD Feeding: bottle  Dispo: continue routine postop care   LOS: 1 day   Myna Hidalgo,  DO Faculty Attending, Center [facility] 08/10/2023, 9:05 AM

## 2023-08-10 NOTE — Progress Notes (Signed)
CSW received consult for hx of Anxiety and Depression. CSW met with MOB to offer support and complete assessment. When CSW entered room, MOB was observed sitting in hospital bed, bonding with infant skin to skin. FOB was exiting room and returned at the end of assessment. MOB provided verbal consent to speak in front of FOB about anything. CSW introduced self and reason for consult. MOB presented as calm and remained engaged throughout consult.  CSW inquired how MOB has felt emotionally since infant's arrival. MOB reports she is feeling "okay." MOB added that she was let go from her job around [employer] which has caused feelings of stress. MOB reports that she has started looking for work and plans to resume job searching in a couple weeks. CSW provided active listening and acknowledged the stress involved in an unanticipated job change. CSW inquired about MOB's mental health history. MOB reports she was diagnosed with depression and anxiety in her 38's and has been prescribed various SSRI's since her initial diagnosis. MOB reports she was most recently prescribed Cymbalta through her PCP which she discontinued when her pregnancy was confirmed. MOB reports she has met with therapists a couple of times in the past but did not find therapy helpful. MOB recalls during her pregnancy, she endorsed more symptoms of anxiety than depression but felt she was able to manage symptoms without further intervention. MOB notes that her pregnancy with infant was an "easier pregnancy" compared to her first pregnancy. MOB recalls endorsing symptoms of postpartum depression after the birth of her older daughter, marked by feeling down, feelings of sadness, and crying. MOB states she plans to remain off of antidepressants and see how she feels with the plan of resuming medication if she begins to endorse symptoms of postpartum depression/anxiety. MOB identified FOB, her parents and FOB's parents as supports. MOB denied current  SI/HI. DV was not assessed due to FOB being present.  CSW provided education regarding the baby blues period vs. perinatal mood disorders, discussed treatment and gave resources for mental health follow up if concerns arise.  CSW recommends self-evaluation during the postpartum time period using the New Mom Checklist from Postpartum Progress and encouraged MOB to contact a medical professional if symptoms are noted at any time.    MOB reports she has all needed items for infant, including a car seat and bassinet. MOB has chosen Therapist, sports for infant's follow up care.  CSW provided review of Sudden Infant Death Syndrome (SIDS) precautions.    CSW identifies no further need for intervention and no barriers to discharge at this time.  Signed,  Norberto Sorenson, MSW, LCSWA, LCASA 08/10/2023 6:04 PM

## 2023-08-10 NOTE — Lactation Note (Signed)
This note was copied from a baby's chart. Lactation Consultation Note  Patient Name: Melody Stewart Today's Date: 08/10/2023 Age:39 hours Reason for consult: Follow-up assessment;1st time breastfeeding;Term  P2, 39 wks, @ 23 hrs of life.  Infant on and off breast with LC arrival in cradle type positioning. Encouraged mom to try more hands on positioning with baby- breast in hand, baby in hand- (Cross-Cradle) bringing baby on deeper. Encouraged mom in first days we are more directive teaching baby. Praised couplet- doing great, day 1 sleepy was normal. Today- look for more awake baby/ feeding cues, and cluster feeding overnight. Encouraged nipple care post feeds proactively- mom has her own balm- highlighted EBM great, also coconut oil available. Encouraged mom to start with hand expression, use breast compression during feed, and offer both breasts- longer feeding durations today normal. Encouraged mom/baby are learning- keep practicing- call LC as ready for assist.   Maternal Data Does the patient have breastfeeding experience prior to this delivery?: Yes How long did the patient breastfeed?: Latching issues, 6 months pumping  Feeding Mother's Current Feeding Choice: Breast Milk  LATCH Score Latch: Grasps breast easily, tongue down, lips flanged, rhythmical sucking.  Audible Swallowing: Spontaneous and intermittent  Type of Nipple: Everted at rest and after stimulation  Comfort (Breast/Nipple): Soft / non-tender  Hold (Positioning): Assistance needed to correctly position infant at breast and maintain latch.  LATCH Score: 9   Lactation Tools Discussed/Used    Interventions Interventions: Breast feeding basics reviewed;Assisted with latch;Hand express;Breast compression;Position options;Education  Discharge Pump: Personal;DEBP (Medela)  Consult Status Consult Status: Follow-up Date: 08/11/23 Follow-up type: In-patient    [facility] 08/10/2023, 8:57 AM

## 2023-08-10 NOTE — Progress Notes (Signed)
Provider gave verbal order for zofran

## 2023-08-10 NOTE — Lactation Note (Signed)
This note was copied from a baby's chart. Lactation Consultation Note  Patient Name: Melody Stewart Today's Date: 08/10/2023 Age:39 hours Reason for consult: Follow-up assessment;Term;Infant weight loss;Breastfeeding assistance  P2- RN reported that infant is still having difficulty with latching, so LC followed up with her. LC assessed infant's suck with a gloved finger and noted that she is a little more coordinated today then she was last night. LC placed infant on the left breast in the cross cradle hold. LC was able to latch infant multiple times, but then she would pop back off. During one of the attempts, infant stayed latched (nursing) for 5 minutes and LC heard MOB's colostrum squirt into infant mouth. MOB believes that infant is just "lazy" and does not want to work. After 15 minutes of attempting a feeding, MOB/LC decided to stop. LC recommended hand expression. LC only expressed for 5 minutes due to MOB's breast sensitivity. Lc was able to collect 5 mL, which was spoon fed to infant. Infant tolerated feeding well.  LC encouraged MOB to continue attempting breast feedings first, but for no more than 10 minutes. Then if infant continues to act as she has been, pump. MOB agreed. LC set up MOB with a DEBP with size 21 mm flanges because the hospital is currently out of 18 mm flanges. After 3 minutes of pumping, LC noted colostrum collecting in both flanges. LC encouraged MOB to call for further assistance as needed. [facility] sent [facility] a referral for MOB and updated RN of how feeding went.  Maternal Data Has patient been taught Hand Expression?: Yes Does the patient have breastfeeding experience prior to this delivery?: Yes  Feeding Mother's Current Feeding Choice: Breast Milk  LATCH Score Latch: Repeated attempts needed to sustain latch, nipple held in mouth throughout feeding, stimulation needed to elicit sucking reflex.  Audible Swallowing: A few with stimulation  Type of  Nipple: Everted at rest and after stimulation  Comfort (Breast/Nipple): Soft / non-tender  Hold (Positioning): Full assist, staff holds infant at breast  LATCH Score: 6   Lactation Tools Discussed/Used Tools: Pump;Flanges Flange Size: 21 (needs a 16 mm flange and hospital is out of 18 mm flanges at this time) Breast pump type: Double-Electric Breast Pump;Manual Pump Education: Setup, frequency, and cleaning;Milk Storage Reason for Pumping: infant not latching well to breast Pumping frequency: 15-20 min every 3 hrs or sooner as needed  Interventions Interventions: Breast feeding basics reviewed;Assisted with latch;Hand express;Breast compression;Adjust position;Support pillows;Position options;Expressed milk;DEBP;Hand pump;Education;LC Services brochure  Discharge Discharge Education: Engorgement and breast care;Warning signs for feeding baby;Outpatient recommendation;Outpatient Epic message sent Pump: DEBP;Personal  Consult Status Consult Status: Follow-up Date: 08/11/23 Follow-up type: In-patient    Melody Stewart BS, IBCLC 08/10/2023, 5:20 PM

## 2023-08-11 DIAGNOSIS — Z975 Presence of (intrauterine) contraceptive device: Secondary | ICD-10-CM

## 2023-08-11 MED ORDER — OXYCODONE HCL 5 MG PO TABS: 5.0000 mg | ORAL_TABLET | ORAL | 0 refills | Status: DC | PRN

## 2023-08-11 MED ORDER — IBUPROFEN 600 MG PO TABS: 600.0000 mg | ORAL_TABLET | Freq: Four times a day (QID) | ORAL | 0 refills | Status: DC

## 2023-08-11 MED ORDER — ACETAMINOPHEN 500 MG PO TABS: 1000.0000 mg | ORAL_TABLET | Freq: Four times a day (QID) | ORAL | 0 refills | Status: AC

## 2023-08-12 ENCOUNTER — Encounter: Payer: Medicaid Other | Admitting: Women's Health

## 2023-08-19 ENCOUNTER — Encounter: Payer: Self-pay | Admitting: Women's Health

## 2023-08-19 ENCOUNTER — Ambulatory Visit: Payer: Medicaid Other | Admitting: Women's Health

## 2023-08-19 VITALS — BP 111/72 | HR 69 | Ht 66.0 in | Wt 266.4 lb

## 2023-08-19 DIAGNOSIS — F418 Other specified anxiety disorders: Secondary | ICD-10-CM

## 2023-08-19 DIAGNOSIS — Z4889 Encounter for other specified surgical aftercare: Secondary | ICD-10-CM

## 2023-08-19 NOTE — Progress Notes (Signed)
GYN VISIT Patient name: Melody Stewart MRN [id number]  Date of birth: Mar 16, 1986 Chief Complaint:   Post-op Follow-up (1 wk incision check,depression check)  History of Present Illness:   Melody Stewart is a 38 y.o. [id number] Caucasian female 10d s/p RCS being seen today for incision and mood check.  H/O dep/anx, no meds currently, EPDS 6, on cymbalta prior to pregnancy from PCP. Does feel emotional, crying a lot, doesn't really feel depressed. Declines IBH. Breastfeeding (pumping), only supplementing a little.  Patient's last menstrual period was 11/09/2022.    02/19/2023    3:31 PM 12/06/2021    3:45 PM 09/20/2020   11:38 AM 06/15/2019    8:48 AM 07/24/2018   10:56 AM  Depression screen PHQ 2/9  Decreased Interest 0 2 1 1  0  Down, Depressed, Hopeless 0 1 1 2 1   PHQ - 2 Score 0 3 2 3 1   Altered sleeping 1 3 1 3    Tired, decreased energy 2 3 3 3    Change in appetite 1 3 0 2   Feeling bad or failure about yourself  0 2 0 2   Trouble concentrating 0 2 0 2   Moving slowly or fidgety/restless 0 0 0 0   Suicidal thoughts 0 0 0 0   PHQ-9 Score 4 16 6 15    Difficult doing work/chores    Very difficult         02/19/2023    3:31 PM 12/06/2021    3:45 PM 09/20/2020   11:39 AM  GAD 7 : Generalized Anxiety Score  Nervous, Anxious, on Edge 0 3 1  Control/stop worrying 0 3 0  Worry too much - different things 0 3 1  Trouble relaxing 0 3 0  Restless 0 1 0  Easily annoyed or irritable 0 3 1  Afraid - awful might happen 0 0 0  Total GAD 7 Score 0 16 3     Review of Systems:   Pertinent items are noted in HPI Denies fever/chills, dizziness, headaches, visual disturbances, fatigue, shortness of breath, chest pain, abdominal pain, vomiting, abnormal vaginal discharge/itching/odor/irritation, problems with periods, bowel movements, urination, or intercourse unless otherwise stated above.  Pertinent History Reviewed:  Reviewed past medical,surgical, social, obstetrical and family history.   Reviewed problem list, medications and allergies. Physical Assessment:   Vitals:   08/19/23 1507  BP: 111/72  Pulse: 69  Weight: 266 lb 6.4 oz (120.8 kg)  Height: 5\' 6"  (1.676 m)  Body mass index is 43 kg/m.       Physical Examination:   General appearance: alert, well appearing, and in no distress  Mental status: alert, oriented to person, place, and time  Skin: warm & dry   Cardiovascular: normal heart rate noted  Respiratory: normal respiratory effort, no distress  Abdomen: soft, non-tender, c/s incision healing well  Pelvic: examination not indicated  Extremities: no edema   Chaperone: N/A    No results found for this or any previous visit (from the past 24 hours).  Assessment & Plan:  1) 10d s/p RCS w/ IUD insertion> incision healing well, breastfeeding (milk tips given)  2) H/O dep/anx> on cymbalta prior to pregnancy rx'd by PCP, no meds currently, pp blues right now, discussed if continues or worsens contact PCP to resume cymbalta (or can contact us if needed), declines IBH right now  Meds: No orders of the defined types were placed in this encounter.   No orders of the defined types were placed  in this encounter.   Return for As scheduled.  Cheral Marker CNM, [facility] 08/19/2023 3:39 PM

## 2023-08-19 NOTE — Patient Instructions (Signed)
Tips To Increase Milk Supply Lots of water! Enough so that your urine is clear Plenty of calories, if you're not getting enough calories, your milk supply can decrease Breastfeed/pump often, every 2-3 hours x 20-33mins Fenugreek 3 pills 3 times a day, this may make your urine smell like maple syrup Mother's Milk Tea Lactation cookies, google for the recipe Real oatmeal Body Armor sports drinks Liquid Gold Greater Than hydration drink

## 2023-09-16 ENCOUNTER — Ambulatory Visit: Payer: Medicaid Other | Admitting: Women's Health

## 2023-09-16 ENCOUNTER — Encounter: Payer: Self-pay | Admitting: Women's Health

## 2023-09-16 ENCOUNTER — Other Ambulatory Visit (HOSPITAL_COMMUNITY)
Admission: RE | Admit: 2023-09-16 | Discharge: 2023-09-16 | Disposition: A | Discharge status: Home / Self Care | Source: Ambulatory Visit | Attending: Women's Health | Admitting: Women's Health

## 2023-09-16 DIAGNOSIS — Z124 Encounter for screening for malignant neoplasm of cervix: Secondary | ICD-10-CM | POA: Diagnosis present

## 2023-09-16 DIAGNOSIS — Z98891 History of uterine scar from previous surgery: Secondary | ICD-10-CM | POA: Diagnosis not present

## 2023-09-16 DIAGNOSIS — F419 Anxiety disorder, unspecified: Secondary | ICD-10-CM

## 2023-09-16 DIAGNOSIS — F418 Other specified anxiety disorders: Secondary | ICD-10-CM

## 2023-09-16 DIAGNOSIS — F32A Depression, unspecified: Secondary | ICD-10-CM

## 2023-09-16 NOTE — Progress Notes (Signed)
 POSTPARTUM VISIT Patient name: Melody Stewart MRN [id number]  Date of birth: 1986-05-17 Chief Complaint:   Postpartum Care (pap)  History of Present Illness:   Melody Stewart is a 38 y.o. G7P2002 Caucasian female being seen today for a postpartum visit. She is 5 weeks postpartum following a repeat cesarean section, low transverse incision at 39.0 gestational weeks. IOL: no, for n/a. Anesthesia: spinal.  Laceration: n/a.  Complications: none. Inpatient contraception: yes pp Mirena .   Pregnancy uncomplicated. Tobacco use: no. Substance use disorder: no. Last pap smear: 09/20/20 and results were NILM w/ HRHPV negative. Next pap smear due: now Patient's last menstrual period was 11/09/2022.  Postpartum course has been uncomplicated. Bleeding spotting. Bowel function is normal. Bladder function is normal. Urinary incontinence? no, fecal incontinence? no Patient is not sexually active. Last sexual activity: prior to birth of baby. Desired contraception: Mirena placed postplacental in hospital   . Patient does want a pregnancy in the future.  Desired family size is 3 children.   Upstream - 09/16/23 1001       Pregnancy Intention Screening   Does the patient want to become pregnant in the next year? No    Does the patient's partner want to become pregnant in the next year? No    Would the patient like to discuss contraceptive options today? No      Contraception Wrap Up   Current Method IUD or IUS    End Method IUD or IUS    Contraception Counseling Provided No            The pregnancy intention screening data noted above was reviewed. Potential methods of contraception were discussed. The patient elected to proceed with IUD or IUS.  Edinburgh Postpartum Depression Screening: negative, h/o dep/anx- on cymbalta prior to pregnancy. Saw PCP last week, restarting cymbalta, will f/u w/ her. Denies SI/HI/II, sleeping ok, doesn't eat well during day, good support. Still finds joy in things she  used to.   Edinburgh Postnatal Depression Scale - 09/16/23 1006       Edinburgh Postnatal Depression Scale:  In the Past 7 Days   I have been able to laugh and see the funny side of things. 0    I have looked forward with enjoyment to things. 0    I have blamed myself unnecessarily when things went wrong. 1    I have been anxious or worried for no good reason. 2    I have felt scared or panicky for no good reason. 2    Things have been getting on top of me. 2    I have been so unhappy that I have had difficulty sleeping. 0    I have felt sad or miserable. 1    I have been so unhappy that I have been crying. 1    The thought of harming myself has occurred to me. 0    Edinburgh Postnatal Depression Scale Total 9                02/19/2023    3:31 PM 12/06/2021    3:45 PM 09/20/2020   11:39 AM  GAD 7 : Generalized Anxiety Score  Nervous, Anxious, on Edge 0 3 1  Control/stop worrying 0 3 0  Worry too much - different things 0 3 1  Trouble relaxing 0 3 0  Restless 0 1 0  Easily annoyed or irritable 0 3 1  Afraid - awful might happen 0 0 0  Total  GAD 7 Score 0 16 3     Baby's course has been uncomplicated. Baby is feeding by breast and bottle: milk supply inadequate . Infant has a pediatrician/family doctor? Yes.  Childcare strategy if returning to work/school: daycare.  Pt has material needs met for her and baby: Yes.   Review of Systems:   Pertinent items are noted in HPI Denies Abnormal vaginal discharge w/ itching/odor/irritation, headaches, visual changes, shortness of breath, chest pain, abdominal pain, severe nausea/vomiting, or problems with urination or bowel movements. Pertinent History Reviewed:  Reviewed past medical,surgical, obstetrical and family history.  Reviewed problem list, medications and allergies. OB History  Gravida Para Term Preterm AB Living  2 2 2  0 0 2  SAB IAB Ectopic Multiple Live Births  0 0 0 0 1    # Outcome Date GA Lbr Len/2nd Weight Sex Type  Anes PTL Lv  2 Term 08/09/23 [redacted]w[redacted]d  7 lb 11.5 oz (3.5 kg) F CS-LTranv Spinal  LIV  1 Term 04/17/18 [redacted]w[redacted]d  6 lb 12.8 oz (3.085 kg) F CS-LTranv EPI Y      Complications: Intraamniotic Infection, Failure to Progress in First Stage, Fetal Intolerance, Severe pre-eclampsia   Physical Assessment:   Vitals:   09/16/23 0955  BP: 120/80  Pulse: 89  Weight: 267 lb (121.1 kg)  Height: 5\' 6"  (1.676 m)  Body mass index is 43.09 kg/m.       Physical Examination:   General appearance: alert, well appearing, and in no distress  Mental status: alert, oriented to person, place, and time  Skin: warm & dry   Cardiovascular: normal heart rate noted   Respiratory: normal respiratory effort, no distress   Breasts: deferred, no complaints   Abdomen: soft, non-tender, c/s incision well healed  Pelvic: VULVA: normal appearing vulva with no masses, tenderness or lesions, VAGINA: normal appearing vagina with normal color and discharge, no lesions, CERVIX: normal appearing cervix without discharge or lesions. IUD strings long, trimmed. Thin prep pap obtained: Yes  Rectal: not examined  Extremities: Edema: none   Chaperone: Peggy Dones         No results found for this or any previous visit (from the past 24 hours).  Assessment & Plan:  1) Postpartum exam 2) 5 wks s/p repeat cesarean section, low transverse incision 3) breast & bottle feeding> milk tips given 4) Depression screening 5) Contraception> s/p pp Mirena, strings trimmed today 6) Dep/anx> restarted cymbalta w/ PCP last week, plans to f/u w/ her  Essential components of care per ACOG recommendations:  1.  Mood and well being:  If positive depression screen, discussed and plan developed.  If using tobacco we discussed reduction/cessation and risk of relapse If current substance abuse, we discussed and referral to local resources was offered.   2. Infant care and feeding:  If breastfeeding, discussed returning to work, pumping,  breastfeeding-associated pain, guidance regarding return to fertility while lactating if not using another method. If needed, patient was provided with a letter to be allowed to pump q 2-3hrs to support lactation in a private location with access to a refrigerator to store breastmilk.   Recommended that all caregivers be immunized for flu, pertussis and other preventable communicable diseases If pt does not have material needs met for her/baby, referred to local resources for help obtaining these.  3. Sexuality, contraception and birth spacing Provided guidance regarding sexuality, management of dyspareunia, and resumption of intercourse Discussed avoiding interpregnancy interval <12mths and recommended birth spacing of 18 months  4. Sleep and fatigue Discussed coping options for fatigue and sleep disruption Encouraged family/partner/community support of 4 hrs of uninterrupted sleep to help with mood and fatigue  5. Physical recovery  If pt had a C/S, assessed incisional pain and providing guidance on normal vs prolonged recovery If pt had a laceration, perineal healing and pain reviewed.  If urinary or fecal incontinence, discussed management and referred to PT or uro/gyn if indicated  Patient is safe to resume physical activity. Discussed attainment of healthy weight.  6.  Chronic disease management Discussed pregnancy complications if any, and their implications for future childbearing and long-term maternal health. Review recommendations for prevention of recurrent pregnancy complications, such as 17 hydroxyprogesterone caproate to reduce risk for recurrent PTB not applicable, or aspirin to reduce risk of preeclampsia not applicable. Pt had GDM: no. If yes, 2hr GTT scheduled: not applicable. Reviewed medications and non-pregnant dosing including consideration of whether pt is breastfeeding using a reliable resource such as LactMed: yes Referred for f/u w/ PCP or subspecialist providers as  indicated: yes  7. Health maintenance Mammogram at 40yo or earlier if indicated Pap smears as indicated  Meds: No orders of the defined types were placed in this encounter.   Follow-up: Return in about 1 year (around 09/15/2024) for Physical.   No orders of the defined types were placed in this encounter.   Cheral Marker CNM, [facility] 09/16/2023 10:28 AM

## 2023-09-16 NOTE — Patient Instructions (Signed)
 Tips To Increase Milk Supply Lots of water! Enough so that your urine is clear Plenty of calories, if you're not getting enough calories, your milk supply can decrease Breastfeed/pump often, every 2-3 hours x 20-33mins Fenugreek 3 pills 3 times a day, this may make your urine smell like maple syrup Mother's Milk Tea Lactation cookies, google for the recipe Real oatmeal Body Armor sports drinks Liquid Gold Greater Than hydration drink

## 2023-09-19 ENCOUNTER — Encounter: Payer: Self-pay | Admitting: Women's Health

## 2023-09-19 LAB — CYTOLOGY - PAP
Comment: NEGATIVE
Diagnosis: NEGATIVE
High risk HPV: NEGATIVE

## 2023-12-04 ENCOUNTER — Other Ambulatory Visit: Payer: Self-pay | Admitting: Urology

## 2023-12-05 ENCOUNTER — Other Ambulatory Visit: Payer: Self-pay | Admitting: Urology

## 2023-12-05 ENCOUNTER — Encounter (HOSPITAL_COMMUNITY): Payer: Self-pay | Admitting: Urology

## 2023-12-05 NOTE — Progress Notes (Addendum)
 Spoke w/ via phone for pre-op interview---[facility] needs dos---- KUB              COVID test -----patient states asymptomatic no test needed Arrive at -------0645 NPO after MN NO Solid Food.  Clear liquids from MN until--- Med rec completed. Pt aware to hold ASA/NSAIDs and supplements per [facility] protocol.  Medications to take morning of surgery -----oxycodone , phenergan  Diabetic/Weight loss medication ----- No Alcohol or recreational drugs for 24 hours/Tobacco products for 6 hours ---- Patient instructed to bring blue lithotripsy folder, photo id and insurance card day of surgery. Patient aware to have Driver (ride ) / caregiver for 24 hours after surgery -----Tanis Fan [phone] Patient Special Instructions -----bring blue folder, wear comfortable clothes. Do not take Ketorolac  Ibuprofen , Aspirin  products, vitamins, or supplements today. Pre-Op special Instructions ----- take laxative of choice day before procedure Patient verbalized understanding of instructions that were given at this phone interview. Patient denies shortness of breath, chest pain, fever, cough at this phone interview.

## 2023-12-05 NOTE — H&P (Signed)
 Office Visit Report     12/05/2023   --------------------------------------------------------------------------------   Melody Stewart  MRN: [id number]  DOB: May 14, 1986, 38 year old Female  SSN:    PRIMARY CARE:  [facility]  PRIMARY CARE FAX:  [phone]  REFERRING:    PROVIDER:  Osborn Blaze, M.D.  TREATING:  Goble Last, P.A.  LOCATION:  [facility] [phone]     --------------------------------------------------------------------------------   CC/HPI: 1 - Recurrent Urolithiasis -  2019 - Stent / medical therapy for stone in pregnancy  11/2023 - 8mm Rt proximal stone + 10mm Rt renal stone on ER CT in [place] (images NOT avail for review). KUB non visible stones.   2 - Medical Stone Disease -  BMP, PTH, Urate - pending; Composition - pending; 24 Hr Urines - pending   PMH sig for large obesity, knee surgery. Her PCP is with [facility] in [place].   Today " Melody Stewart " is seen as new patient for KUB and Rt ureteral stone. UA without infectious paraemters, Cr 1.1. Colic well controlled. Stones NOT visible on KUB today.   12/05/2023: Melody Stewart returns today for uncontrolled pain of her right ureteral stone. She has been taking Toradol  with minimal improvement, and has also been making her fatigued, she is having difficulty staying awake while caring for her baby and at work. She has had nausea without vomiting.     ALLERGIES: Hydrocodone - Nausea (Moderate to Severe), Vomiting (Moderate to Severe), Dizziness (Moderate to Severe)    MEDICATIONS: Ketorolac  Tromethamine  10 MG Tablet 1 tab Q8 prn mild-moderate kidney stone pain  Omeprazole  20 MG Capsule Delayed Release 1 capsule PO Daily  Albuterol Sulfate  Amoxicillin-Pot Clavulanate 875-125 MG Tablet 1 tablet PO BID  diazePAM 5 MG Tablet 1 tablet PO BID  DULoxetine HCl 30 MG Capsule Delayed Release Particles 1 capsule PO Daily  Ibuprofen  400 MG Tablet 1 tablet PO Q 6 H PRN  oxyCODONE  HCl 5 MG Tablet 1 tab Q  8 PRN breakthrough kidney stone pain  oxyCODONE -Acetaminophen  5-325 MG Tablet 1 tablet PO Q 6 H PRN  Promethazine  HCl 25 MG Tablet 1 tablet PO Q 6 H PRN     GU PSH: Cystoscopy Insert Stent, Bilateral - 2019 Ureteroscopic laser litho, Left - 2019       PSH Notes: L knee lateral release    NON-GU PSH: No Non-GU PSH    GU PMH: Renal and ureteral calculus - 12/03/2023    NON-GU PMH: No Non-GU PMH    FAMILY HISTORY: 1 Daughter - No Family History Kidney Stones - Mother   SOCIAL HISTORY: Marital Status: Single Preferred Language: English; Race: White Current Smoking Status: Patient does not smoke anymore.   Tobacco Use Assessment Completed: Used Tobacco in last 30 days? Does not use smokeless tobacco. Types of alcohol consumed: Beer. Social Drinker.  Drinks 1 caffeinated drink per day. Has not had a blood transfusion.    REVIEW OF SYSTEMS:    GU Review Female:   Patient reports frequent urination. Patient denies hard to postpone urination, burning /pain with urination, get up at night to urinate, leakage of urine, stream starts and stops, trouble starting your stream, have to strain to urinate, and being pregnant.  Gastrointestinal (Upper):   Patient reports nausea. Patient denies vomiting and indigestion/ heartburn.  Gastrointestinal (Lower):   Patient denies diarrhea and constipation.  Constitutional:   Patient reports fatigue. Patient denies fever, night sweats, and weight loss.  Skin:   Patient  denies skin rash/ lesion and itching.  Eyes:   Patient denies blurred vision and double vision.  Ears/ Nose/ Throat:   Patient denies sore throat and sinus problems.  Hematologic/Lymphatic:   Patient denies swollen glands and easy bruising.  Cardiovascular:   Patient denies leg swelling and chest pains.  Respiratory:   Patient denies cough and shortness of breath.  Endocrine:   Patient denies excessive thirst.  Musculoskeletal:   Patient reports back pain. Patient denies joint pain.   Neurological:   Patient denies headaches and dizziness.  Psychologic:   Patient denies depression and anxiety.   VITAL SIGNS:      12/05/2023 08:49 AM  BP 121/81 mmHg  Pulse 93 /min  Temperature 98.0 F / 36.6 C   MULTI-SYSTEM PHYSICAL EXAMINATION:    Constitutional: Well-nourished. No physical deformities. Normally developed. Good grooming.  Neck: Neck symmetrical, not swollen. Normal tracheal position.  Respiratory: No labored breathing, no use of accessory muscles.   Cardiovascular: Normal temperature, normal extremity pulses, no swelling, no varicosities.  Neurologic / Psychiatric: Oriented to time, oriented to place, oriented to person. No depression, no anxiety, no agitation.  Gastrointestinal: No mass, no tenderness, no rigidity, non obese abdomen. Right CVA tenderness.      Complexity of Data:  Source Of History:  Patient, Medical Record Summary  Records Review:   Previous Doctor Records, Previous Patient Records  Urine Test Review:   Urinalysis  X-Ray Review: KUB: Reviewed Films. Reviewed Report. Discussed With Patient.  Renal Ultrasound (Limited): Reviewed Films. Reviewed Report. Discussed With Patient.  C.T. Abdomen/Pelvis: Reviewed Report. Discussed With Patient.     PROCEDURES:         Renal Ultrasound (Limited) - 40981  Kidney: Right  Length: 13.43 cm Depth: 6.75cm Cortical Width: 1.17 cm Width: 7.42cm    Right Kidney/Ureter:  multiple calcs noted, severe hydro and hydroureter noted  Bladder:  pvr      . Patient confirmed No Neulasta OnPro Device.            KUB - [id number]  A single view of the abdomen is obtained. Bilateral renal shadows visualized. 7 mm calcification noted within right renal shadow. 9 x 4 mm calcification overlying the sacrum on the right that potentially represents the stone in question. No other Calcifications observed.      The bones appeared normal. The bowel gas pattern appeared normal. The soft tissues were unremarkable. .  Patient confirmed No Neulasta OnPro Device.           Visit Complexity - G2211    ASSESSMENT:      ICD-10 Details  1 GU:   Renal and ureteral calculus - N20.2 Acute, Systemic Symptoms  2   Ureteral obstruction secondary to calculous - N13.2 Undiagnosed New Problem   PLAN:           Orders Labs Urine Culture  X-Rays: KUB    Renal Ultrasound (Limited)          Schedule Return Visit/Planned Activity: Next Available Appointment - Schedule Surgery          Document Letter(s):  Created for Patient: Clinical Summary         Notes:   9 mm stone visible on KUB overlying the sacrum. Severe hydronephrosis on renal ultrasound. We discussed the management of renal/ureteral stones. These options include observation, ureteroscopy, and shockwave lithotripsy. We discussed which options are relevant to these particular stones. Reviewed this with the on-call physician, she will be scheduled for  lithotripsy tomorrow.   For shockwave lithotripsy I described the risks which include arrhythmia, kidney contusion, kidney hemorrhage, need for transfusion, pain, inability to break up stone, inability to pass stone fragments, Steinstrasse, infection associated with obstructing stones, need for different surgical procedure, need for repeat shockwave lithotripsy.        Next Appointment:      Next Appointment: 12/18/2023 12:00 PM    Appointment Type: Surgery     Location: [facility] [phone]    Provider: Osborn Blaze, M.D.    Reason for Visit: WL/OP CYSTO, (R) URS, HLL, STENT      * Signed by Goble Last, P.A. on 12/05/23 at 11:03 AM (EDT)*      The information contained in this medical record document is considered private and confidential patient information. This information can only be used for the medical diagnosis and/or medical services that are being provided by the patient's selected caregivers. This information can only be distributed outside of the patient's care if  the patient agrees and signs waivers of authorization for this information to be sent to an outside source or route.

## 2023-12-06 ENCOUNTER — Ambulatory Visit (HOSPITAL_COMMUNITY)

## 2023-12-06 ENCOUNTER — Other Ambulatory Visit: Payer: Self-pay

## 2023-12-06 ENCOUNTER — Encounter (HOSPITAL_COMMUNITY)
Admission: RE | Disposition: A | Discharge status: Home / Self Care | Payer: Self-pay | Source: Home / Self Care | Attending: Urology

## 2023-12-06 ENCOUNTER — Ambulatory Visit (HOSPITAL_COMMUNITY)
Admission: RE | Admit: 2023-12-06 | Discharge: 2023-12-06 | Disposition: A | Discharge status: Home / Self Care | Attending: Urology | Admitting: Urology

## 2023-12-06 ENCOUNTER — Encounter (HOSPITAL_COMMUNITY): Payer: Self-pay | Admitting: Urology

## 2023-12-06 DIAGNOSIS — J45909 Unspecified asthma, uncomplicated: Secondary | ICD-10-CM | POA: Diagnosis not present

## 2023-12-06 DIAGNOSIS — Z6841 Body Mass Index (BMI) 40.0 and over, adult: Secondary | ICD-10-CM | POA: Insufficient documentation

## 2023-12-06 DIAGNOSIS — E669 Obesity, unspecified: Secondary | ICD-10-CM | POA: Insufficient documentation

## 2023-12-06 DIAGNOSIS — N201 Calculus of ureter: Secondary | ICD-10-CM | POA: Diagnosis present

## 2023-12-06 HISTORY — PX: EXTRACORPOREAL SHOCK WAVE LITHOTRIPSY: SHX1557

## 2023-12-06 HISTORY — DX: Personal history of urinary calculi: Z87.442

## 2023-12-06 SURGERY — LITHOTRIPSY, ESWL
Anesthesia: LOCAL | Laterality: Right

## 2023-12-06 MED ORDER — SODIUM CHLORIDE 0.9 % IV SOLN
2.0000 g | Freq: Once | INTRAVENOUS | Status: AC
  Administered 2023-12-06: 2 g via INTRAVENOUS
  Filled 2023-12-06: qty 20

## 2023-12-06 MED ORDER — CIPROFLOXACIN HCL 500 MG PO TABS: 500.0000 mg | ORAL_TABLET | ORAL | Status: DC

## 2023-12-06 MED ORDER — DIAZEPAM 5 MG PO TABS
10.0000 mg | ORAL_TABLET | ORAL | Status: AC
  Administered 2023-12-06: 10 mg via ORAL
  Filled 2023-12-06: qty 2

## 2023-12-06 MED ORDER — DIPHENHYDRAMINE HCL 25 MG PO CAPS
25.0000 mg | ORAL_CAPSULE | ORAL | Status: AC
  Administered 2023-12-06: 25 mg via ORAL
  Filled 2023-12-06: qty 1

## 2023-12-06 MED ORDER — SODIUM CHLORIDE 0.9 % IV SOLN
INTRAVENOUS | Status: DC
  Administered 2023-12-06: 1000 mL via INTRAVENOUS

## 2023-12-06 NOTE — Interval H&P Note (Signed)
 History and Physical Interval Note:  12/06/2023 10:38 AM  Melody Stewart  has presented today for surgery, with the diagnosis of RIGHT URETERAL STONE.  The various methods of treatment have been discussed with the patient and family. After consideration of risks, benefits and other options for treatment, the patient has consented to  Procedure(s): LITHOTRIPSY, ESWL (Right) as a surgical intervention.  The patient's history has been reviewed, patient examined, no change in status, stable for surgery.  I have reviewed the patient's chart and labs.  Questions were answered to the patient's satisfaction.  No fever or bladder pain. Some mild dysuria. Stone visible over right sacrum a few cm lower than office.    Christina Coyer

## 2023-12-06 NOTE — Op Note (Signed)
 Right 9 mm mid-distal ureteral stone   RIGHT ESWL   Findings: stone faded. Targeted well. She may need a staged procedure should she fail to pass the stone/stone fragments. See [facility] Op Note for full details.

## 2023-12-09 ENCOUNTER — Encounter (HOSPITAL_COMMUNITY): Admission: RE | Admit: 2023-12-09 | Source: Ambulatory Visit

## 2023-12-09 ENCOUNTER — Encounter (HOSPITAL_COMMUNITY): Payer: Self-pay | Admitting: Urology

## 2023-12-18 ENCOUNTER — Ambulatory Visit (HOSPITAL_COMMUNITY): Admit: 2023-12-18 | Admitting: Urology

## 2023-12-18 SURGERY — CYSTOSCOPY/URETEROSCOPY/HOLMIUM LASER/STENT PLACEMENT
Anesthesia: General | Laterality: Right

## 2023-12-27 ENCOUNTER — Other Ambulatory Visit: Payer: Self-pay | Admitting: *Deleted

## 2023-12-27 DIAGNOSIS — K219 Gastro-esophageal reflux disease without esophagitis: Secondary | ICD-10-CM

## 2023-12-27 MED ORDER — OMEPRAZOLE 20 MG PO TBEC: 1.0000 | DELAYED_RELEASE_TABLET | Freq: Every day | ORAL | 3 refills | Status: DC

## 2024-03-25 ENCOUNTER — Encounter: Payer: Self-pay | Admitting: Physical Medicine and Rehabilitation

## 2024-03-25 ENCOUNTER — Ambulatory Visit: Admitting: Physical Medicine and Rehabilitation

## 2024-03-25 DIAGNOSIS — M5441 Lumbago with sciatica, right side: Secondary | ICD-10-CM | POA: Diagnosis not present

## 2024-03-25 DIAGNOSIS — G8929 Other chronic pain: Secondary | ICD-10-CM | POA: Diagnosis not present

## 2024-03-25 DIAGNOSIS — M5442 Lumbago with sciatica, left side: Secondary | ICD-10-CM | POA: Diagnosis not present

## 2024-03-25 DIAGNOSIS — M5416 Radiculopathy, lumbar region: Secondary | ICD-10-CM

## 2024-03-25 DIAGNOSIS — M7918 Myalgia, other site: Secondary | ICD-10-CM

## 2024-03-25 NOTE — Progress Notes (Signed)
 Pain Scale   Average Pain 2 Patient advised she has lower back spasm and it causes her knees to give out.        +Driver, -BT, -Dye Allergies.

## 2024-03-25 NOTE — Progress Notes (Signed)
 Melody Stewart - 38 y.o. female MRN [id number]  Date of birth: 1985-12-03  Office Visit Note: Visit Date: 03/25/2024 PCP: Atilano Deward ORN, MD Referred by: Sasser, Deward ORN, MD  Subjective: Chief Complaint  Patient presents with   Lower Back - Pain   HPI: Melody Stewart is a 38 y.o. female who comes in today per the request of chronic, worsening and severe bilateral lower back pain radiating down both legs. She reports lower back pain chronically for several years. Pain radiating down legs started about 2 months ago. States she feels both legs are weak and are going to give way. Her pain worsens with movement and activity. She describes pain as shooting and stabbing sensation, currently rates as 8 out of 10. Some relief of pain with home exercise regimen, rest and use of medications. No history of formal physical therapy. Lumbar MRI imaging from 2023 shows small shallow central disc protrusion without nerve root impingement at L5-S1. No high grade spinal canal stenosis noted. She underwent (2) left L5-S1 interlaminar injections in our office in 2023, she reports greater than 80% relief of pain for at least 6 months. Patient denies recent trauma or falls.   Patients course is complicated by depression and anxiety.       Review of Systems  Musculoskeletal:  Positive for back pain.  Neurological:  Negative for tingling, sensory change, focal weakness and weakness.  All other systems reviewed and are negative.  Otherwise per HPI.  Assessment & Plan: Visit Diagnoses:    ICD-10-CM   1. Chronic bilateral low back pain with bilateral sciatica  M54.42 Ambulatory referral to Physical Therapy   M54.41    G89.29     2. Radiculopathy, lumbar region  M54.16 Ambulatory referral to Physical Therapy    3. Myofascial pain syndrome  M79.18 Ambulatory referral to Physical Therapy       Plan: Findings:  Chronic, worsening and severe bilateral lower back pain radiating down both legs.  Patient  continues to have pain despite good conservative therapies such as home exercise regimen, rest and use of medications.  Patient's clinical presentation and exam are complex, differentials include lumbar radiculopathy versus myofascial pain syndrome.  We had a long conversation today regarding her pain and lumbar MRI findings. Lumbar MRI imaging from 2023 does show small shallow central disc protrusion without nerve root impingement at L5-S1.  I explained to her that this small disc protrusion has likely resolved over time.  I do feel like her symptoms today are considerably different compared to 2 years ago.  There is myofascial tenderness noted to lumbar paraspinal regions upon palpation today.  I do think patient would benefit from short course of formal physical therapy.  I did go ahead and place an order for PT in [place] today. I would like to see her back post physical therapy for evaluation. Should her pain persist I would consider obtaining new lumbar MRI imaging. Her exam today was non focal, good strength noted to bilateral lower extremities.    Meds & Orders: No orders of the defined types were placed in this encounter.   Orders Placed This Encounter  Procedures   Ambulatory referral to Physical Therapy    Follow-up: Return if symptoms worsen or fail to improve.   Procedures: No procedures performed      Clinical History: FINDINGS: Segmentation:  Standard.   Alignment:  2 mm retrolisthesis of L5 on S1.   Vertebrae: No acute fracture, evidence of discitis, or  aggressive bone lesion.   Conus medullaris and cauda equina: Conus extends to the L1 level. Conus and cauda equina appear normal.   Paraspinal and other soft tissues: No acute paraspinal abnormality.   Disc levels:   Disc spaces: Mild degenerative disease with disc height loss T12-L1 and L5-S1.   T12-L1: Small central disc protrusion. No foraminal or central canal stenosis.   L1-L2: Tiny central disc protrusion. No  foraminal or central canal stenosis.   L2-L3: No significant disc bulge. No neural foraminal stenosis. No central canal stenosis.   L3-L4: No significant disc bulge. No neural foraminal stenosis. No central canal stenosis.   L4-L5: No significant disc bulge. No neural foraminal stenosis. No central canal stenosis. Mild bilateral facet arthropathy.   L5-S1: Small shallow central disc protrusion. No foraminal or central canal stenosis.   IMPRESSION: 1. At L5-S1 there is a small shallow central disc protrusion without nerve root impingement. 2. At T12-L1 and L1-2 there are small central disc protrusions without nerve root impingement. 3. No acute osseous injury of the lumbar spine.     Electronically Signed   By: Julaine Blanch M.D.   On: 12/28/2021 08:41   She reports that she quit smoking about 9 years ago. Her smoking use included cigarettes. She started smoking about 11 years ago. She has never used smokeless tobacco. No results for input(s): HGBA1C, LABURIC in the last 8760 hours.  Objective:  VS:  HT:    WT:   BMI:     BP:   HR: bpm  TEMP: ( )  RESP:  Physical Exam Vitals and nursing note reviewed.  HENT:     Head: Normocephalic and atraumatic.     Right Ear: External ear normal.     Left Ear: External ear normal.     Nose: Nose normal.     Mouth/Throat:     Mouth: Mucous membranes are moist.  Eyes:     Extraocular Movements: Extraocular movements intact.  Cardiovascular:     Rate and Rhythm: Normal rate.     Pulses: Normal pulses.  Pulmonary:     Effort: Pulmonary effort is normal.  Abdominal:     General: Abdomen is flat. There is no distension.  Musculoskeletal:        General: Tenderness present.     Cervical back: Normal range of motion.     Comments: Patient rises from seated position to standing without difficulty. Good lumbar range of motion. No pain noted with facet loading. 5/5 strength noted with bilateral hip flexion, knee flexion/extension,  ankle dorsiflexion/plantarflexion and EHL. No clonus noted bilaterally. No pain upon palpation of greater trochanters. No pain with internal/external rotation of bilateral hips. Sensation intact bilaterally. Myofascial tenderness noted to bilateral lumbar paraspinal regions upon palpation. Negative slump test bilaterally. Ambulates without aid, gait steady.     Skin:    General: Skin is warm and dry.     Capillary Refill: Capillary refill takes less than 2 seconds.  Neurological:     General: No focal deficit present.     Mental Status: She is alert and oriented to person, place, and time.  Psychiatric:        Mood and Affect: Mood normal.        Behavior: Behavior normal.     Ortho Exam  Imaging: No results found.  Past Medical/Family/Surgical/Social History: Medications & Allergies reviewed per EMR, new medications updated. Patient Active Problem List   Diagnosis Date Noted   IUD (intrauterine device) in  place 08/11/2023   Asthma 06/26/2023   S/P cesarean section 02/19/2023   H/O severe pre-eclampsia 02/19/2023   Protrusion of lumbar intervertebral disc 02/22/2022   Trochanteric bursitis, left hip 02/22/2022   AKI (acute kidney injury) (HCC) 03/15/2018   Anxiety and depression 09/14/2015   Past Medical History:  Diagnosis Date   Anemia    Anxiety and depression 09/14/2015   Body aches    Dysuria    Family history of adverse reaction to anesthesia    mother-- hard to wake   Fibroadenoma of right breast    Frequency of urination    GERD (gastroesophageal reflux disease)    History of kidney stones    Hydronephrosis 03/24/2018   Bilateral, Right-mild to moderate, Left mild, noted on US  renal   IBS (irritable bowel syndrome)    Mild asthma    Nephrolithiasis    Urgency of urination    UTI (urinary tract infection)    Vaginal Pap smear, abnormal    Family History  Problem Relation Age of Onset   Hypertension Mother    Other Mother        mother had child  hydrocephic-died   Hypertension Father    Heart disease Paternal Grandfather    Stroke Paternal Grandfather    Thyroid  disease Maternal Grandmother    Heart disease Maternal Grandmother    Cancer Paternal Grandmother        breast   Thyroid  disease Maternal Aunt    Cancer Paternal Aunt        breast   Past Surgical History:  Procedure Laterality Date   CESAREAN SECTION N/A 04/17/2018   Procedure: CESAREAN SECTION;  Surgeon: Corene Coy, MD;  Location: [facility];  Service: Obstetrics;  Laterality: N/A;   CESAREAN SECTION N/A 08/09/2023   Procedure: CESAREAN SECTION;  Surgeon: Erik Kieth BROCKS, MD;  Location: [facility];  Service: Obstetrics;  Laterality: N/A;   CYSTOSCOPY W/ URETERAL STENT PLACEMENT Bilateral 03/16/2018   Procedure: CYSTOSCOPY WITH RETROGRADE PYELOGRAM/URETERAL STENT PLACEMENT;  Surgeon: Nieves Cough, MD;  Location: [facility];  Service: Urology;  Laterality: Bilateral;   CYSTOSCOPY/URETEROSCOPY/HOLMIUM LASER/STENT PLACEMENT Bilateral 05/06/2018   Procedure: CYSTOSCOPY/URETEROSCOPY/HOLMIUM LASER/STENT EXCHANGE STONE BASKET RETRIVAL;  Surgeon: Nieves Cough, MD;  Location: [facility];  Service: Urology;  Laterality: Bilateral;   EXTRACORPOREAL SHOCK WAVE LITHOTRIPSY Right 12/06/2023   Procedure: LITHOTRIPSY, ESWL;  Surgeon: Nieves Cough, MD;  Location: [facility];  Service: Urology;  Laterality: Right;   INTRAUTERINE DEVICE (IUD) INSERTION N/A 08/09/2023   Procedure: INTRAUTERINE DEVICE (IUD) INSERTION;  Surgeon: Erik Kieth BROCKS, MD;  Location: [facility];  Service: Obstetrics;  Laterality: N/A;   KNEE SURGERY Left age 34   TONSILLECTOMY     WISDOM TOOTH EXTRACTION     Social History   Occupational History   Not on file  Tobacco Use   Smoking status: Former    Current packs/day: 0.00    Types: Cigarettes    Start date: 05/02/2012    Quit date: 05/02/2014    Years since quitting: 9.9   Smokeless tobacco: Never  Vaping  Use   Vaping status: Never Used  Substance and Sexual Activity   Alcohol use: Not Currently   Drug use: Never   Sexual activity: Not Currently    Birth control/protection: I.U.D.

## 2024-04-17 NOTE — Therapy (Incomplete)
 OUTPATIENT PHYSICAL THERAPY EVALUATION   Patient Name: Melody Stewart MRN: [id number] DOB:[birth year], 38 y.o., female Today's Date: 04/17/2024  END OF SESSION:   Past Medical History:  Diagnosis Date   Anemia    Anxiety and depression 09/14/2015   Body aches    Dysuria    Family history of adverse reaction to anesthesia    mother-- hard to wake   Fibroadenoma of right breast    Frequency of urination    GERD (gastroesophageal reflux disease)    History of kidney stones    Hydronephrosis 03/24/2018   Bilateral, Right-mild to moderate, Left mild, noted on US  renal   IBS (irritable bowel syndrome)    Mild asthma    Nephrolithiasis    Urgency of urination    UTI (urinary tract infection)    Vaginal Pap smear, abnormal    Past Surgical History:  Procedure Laterality Date   CESAREAN SECTION N/A 04/17/2018   Procedure: CESAREAN SECTION;  Surgeon: Corene Coy, MD;  Location: [facility]THING SU[facility];  Service: Obstetrics;  Laterality: N/A;   CESAREAN SECTION N/A 08/09/2023   Procedure: CESAREAN SECTION;  Surgeon: Erik Kieth BROCKS, MD;  Location: [facility];  Service: Obstetrics;  Laterality: N/A;   CYSTOSCOPY W/ URETERAL STENT PLACEMENT Bilateral 03/16/2018   Procedure: CYSTOSCOPY WITH RETROGRADE PYELOGRAM/URETERAL STENT PLACEMENT;  Surgeon: Nieves Cough, MD;  Location: [facility];  Service: Urology;  Laterality: Bilateral;   CYSTOSCOPY/URETEROSCOPY/HOLMIUM LASER/STENT PLACEMENT Bilateral 05/06/2018   Procedure: CYSTOSCOPY/URETEROSCOPY/HOLMIUM LASER/STENT EXCHANGE STONE BASKET RETRIVAL;  Surgeon: Nieves Cough, MD;  Location: [facility];  Service: Urology;  Laterality: Bilateral;   EXTRACORPOREAL SHOCK WAVE LITHOTRIPSY Right 12/06/2023   Procedure: LITHOTRIPSY, ESWL;  Surgeon: Nieves Cough, MD;  Location: [facility];  Service: Urology;  Laterality: Right;   INTRAUTERINE DEVICE (IUD) INSERTION N/A 08/09/2023   Procedure: INTRAUTERINE DEVICE (IUD) INSERTION;   Surgeon: Erik Kieth BROCKS, MD;  Location: [facility];  Service: Obstetrics;  Laterality: N/A;   KNEE SURGERY Left age 30   TONSILLECTOMY     WISDOM TOOTH EXTRACTION     Patient Active Problem List   Diagnosis Date Noted   IUD (intrauterine device) in place 08/11/2023   Asthma 06/26/2023   S/P cesarean section 02/19/2023   H/O severe pre-eclampsia 02/19/2023   Protrusion of lumbar intervertebral disc 02/22/2022   Trochanteric bursitis, left hip 02/22/2022   AKI (acute kidney injury) 03/15/2018   Anxiety and depression 09/14/2015    PCP: Atilano Deward ORN, MD   REFERRING PROVIDER: Trudy Duwaine BRAVO, NP   REFERRING DIAG:  M54.42,M54.41,G89.29 (ICD-10-CM) - Chronic bilateral low back pain with bilateral sciatica  M54.16 (ICD-10-CM) - Radiculopathy, lumbar region  M79.18 (ICD-10-CM) - Myofascial pain syndrome    Rationale for Evaluation and Treatment:  Rehabiliation  THERAPY DIAG:  No diagnosis found.  ONSET DATE: ***   SUBJECTIVE:  SUBJECTIVE STATEMENT: She reports chronic, worsening and severe bilateral lower back pain radiating down both legs. She reports lower back pain chronically for several years. Pain radiating down legs started about 2 months ago. States she feels both legs are weak and are going to give way.   PERTINENT HISTORY:  See above PMH  PAIN: *** NPRS scale: /10 upon arrival Pain location: Pain description: constant, achy, sharp Aggravating factors:  Relieving factors: rest, meds   PRECAUTIONS: ,  {Therapy precautions:24002}  RED FLAGS: {PT Red Flags:29287}   WEIGHT BEARING RESTRICTIONS:  {Yes ***/No:24003}  FALLS:  Has patient fallen in last 6 months? {fallsyesno:27318}   OCCUPATION:  ***  PLOF:  {PLOF:24004}  PATIENT GOALS:  ***  OBJECTIVE:   Note: Objective measures were completed at Evaluation unless otherwise noted.  DIAGNOSTIC FINDINGS:  IMPRESSION: 1. At L5-S1 there is a small shallow central disc protrusion without nerve root impingement. 2. At T12-L1 and L1-2 there are small central disc protrusions without nerve root impingement. 3. No acute osseous injury of the lumbar spine.  PATIENT SURVEYS:  Patient-Specific Activity Scoring Scheme  0 represents "unable to perform." 10 represents "able to perform at prior level. 0 1 2 3 4 5 6 7 8 9  10 (Date and Score)   Activity Eval     1. ***      2. ***      3. ***    4.    5.    Score ***    Total score = sum of the activity scores/number of activities Minimum detectable change (90%CI) for average score = 2 points Minimum detectable change (90%CI) for single activity score = 3 points     EDEMA:  {Yes/No:304960894}  POSTURE:  {posture:25561}  GAIT: Assistive device utilized: {Assistive devices:23999} Level of assistance: {Levels of assistance:24026} Comments: ***    {PT ROM TABLES:29304}  SPECIAL TESTS:  {PT Special Tests:29286}  FUNCTIONAL TESTS:  {Functional tests:24029}                                                                                                                              TREATMENT DATE:  Eval HEP creation and review with demonstration and trial set preformed, see below for details Selfcare:    PATIENT EDUCATION: Education details: HEP, PT plan of care, selfcare Person educated: Patient Education method: Explanation, Demonstration, Verbal cues, and Handouts Education comprehension: verbalized understanding, further education recommended   HOME EXERCISE PROGRAM: ***  ASSESSMENT:  CLINICAL IMPRESSION: Patient referred to PT for Eval and Treat: Chronic lower back pain, lumbar radiculopathy, myofascial pain syndrome. She does have history of disc protrusions in MRI 2023. Patient will benefit from skilled PT  to improve overall function and to address impairments and limitations listed below.  OBJECTIVE IMPAIRMENTS: decreased activity tolerance for ADL's, difficulty walking, decreased balance, decreased endurance, decreased mobility, decreased ROM, decreased strength, impaired flexibility, impaired UE/LE use, and pain.  ACTIVITY LIMITATIONS: bending, liftting, walking, standing, cleaning,  community activity, driving, reaching, carry, occupation  PERSONAL FACTORS: see above PMH  also affecting patient's functional outcome.  REHAB POTENTIAL: {rehabpotential:25112}  CLINICAL DECISION MAKING: {clinical decision making:25114}  EVALUATION COMPLEXITY: {Evaluation complexity:25115}    GOALS: Short term PT Goals Target date: *** Pt will be I and compliant with HEP. Baseline:  Goal status: New Pt will decrease pain by 25% overall Baseline: Goal status: New  Long term PT goals Target date:*** Pt will improve ROM to [facility] to improve functional mobility Baseline: Goal status: New Pt will improve  strength to at least 4+/5 MMT to improve functional strength Baseline: Goal status: New Pt will improve Patient specific functional scale (PSFS) to at least /10 to show improved function level Baseline: Goal status: New Pt will reduce pain to overall less than 3/10 with usual activity and work activity. Baseline: Goal status: New Pt will be able to ambulate community distances at least 1000 ft WNL gait pattern without complaints Baseline: Goal status: New  PLAN: PT FREQUENCY: 1-3 times per week   PT DURATION: 6-8 weeks  PLANNED INTERVENTIONS  {rehab planned interventions:25118::97110-Therapeutic exercises,97530- Therapeutic [id number]- Neuromuscular re-education,97535- Self Rjmz,02859- Manual therapy,Patient/Family education}  PLAN FOR NEXT SESSION: *** NEXT MD VISIT: PIERRETTE Redell JONELLE Maranda, PT 04/17/2024, 10:55 AM

## 2024-04-20 ENCOUNTER — Encounter: Admitting: Physical Therapy

## 2024-04-20 ENCOUNTER — Ambulatory Visit: Admitting: Physical Therapy

## 2024-05-20 ENCOUNTER — Ambulatory Visit (INDEPENDENT_AMBULATORY_CARE_PROVIDER_SITE_OTHER): Admitting: Surgical

## 2024-05-20 ENCOUNTER — Other Ambulatory Visit: Payer: Self-pay

## 2024-05-20 DIAGNOSIS — M25571 Pain in right ankle and joints of right foot: Secondary | ICD-10-CM

## 2024-05-22 ENCOUNTER — Encounter: Payer: Self-pay | Admitting: Surgical

## 2024-05-22 NOTE — Progress Notes (Signed)
 Office Visit Note   Patient: Melody Stewart           Date of Birth: May 30, 1986           MRN: [id number] Visit Date: 05/20/2024 Requested by: Sasser, Deward ORN, MD [address] [place],  [place] [address] PCP: Atilano Deward ORN, MD  Subjective: Chief Complaint  Patient presents with   right ankle pain    HPI: Melody Stewart is a 38 y.o. female who presents to the office reporting right ankle pain.  Patient states that she has a history of injuring her right ankle multiple times with continual ankle sprains.  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  At that time she sustained about 20 ankle sprains of just the right ankle.  She never rested or took time off due to the demands of her job and she would work through the pain and swelling that she sustained.  She has had on and off pain due to this right ankle as a result of that but it has been substantially worse in the last 6 months to the point where it now will wake her up from sleep at night and cause her pain even at rest.  Describes primarily anterior/medial ankle pain.  She now works a licensed conveyancer and does enjoy doing some active stuff with her family such as pharmacist, community.  She has no history of prior surgery or prior evaluation of her ankle.  Denies any history of diabetes, smoking.  Does have history of rheumatoid arthritis in her family which was her maternal grandmother.  She has tried ibuprofen  or Aleve without relief.  Pain will wake her up with a throbbing sensation.  No locking of the ankle.  No other joints bother her substantially.  Left ankle does not cause her any discomfort and is not susceptible to ankle sprains..                ROS: All systems reviewed are negative as they relate to the chief complaint within the history of present illness.  Patient denies fevers or chills.  Assessment & Plan: Visit Diagnoses:  1. Pain in right ankle and joints of right foot     Plan: Impression is  38 year old female who presents for chronic ankle pain that has progressed to the point where it wakes her up from sleep at night and causes her to rest pain.  She has radiographs taken today demonstrating lucency of the medial aspect of the talus as well as early osteophyte formation of the medial malleolus and irregular calcification noted in the region of the deltoid ligament.  With her severe pain and radiographic findings, need MRI of the right ankle for further evaluation of chondral damage to the talus and irregular calcification noted in the medial ankle.  AP, oblique, lateral views of right ankle reviewed.  No fracture or dislocation.  Slight lucency noted in the medial aspect of the talus concerning for chondral injury.  No substantial joint space narrowing of the ankle aside from the most inferior portion of the medial aspect of the ankle joint line with some small amount of osteophyte formation at the medial malleolus at this location.  Follow-Up Instructions: No follow-ups on file.   Orders:  Orders Placed This Encounter  Procedures   DG Ankle Complete Right   MR ANKLE RIGHT WO CONTRAST   No orders of the defined types were placed in this  encounter.     Procedures: No procedures performed   Clinical Data: No additional findings.  Objective: Vital Signs: There were no vitals taken for this visit.  Physical Exam:  Constitutional: Patient appears well-developed HEENT:  Head: Normocephalic Eyes:EOM are normal Neck: Normal range of motion Cardiovascular: Normal rate Pulmonary/chest: Effort normal Neurologic: Patient is alert Skin: Skin is warm Psychiatric: Patient has normal mood and affect  Ortho Exam: Ortho exam demonstrates left ankle with intact dorsiflexion, plantarflexion, inversion, eversion actively and passively.  Stable to stressing syndesmosis and anterior drawer the ankle.  Right ankle with palpable DP pulse.  Intact ankle dorsiflexion, plantarflexion,  inversion, eversion, EHL with equivalent strength compared to the contralateral side aside from some slight weakness of dorsiflexion and difficulty achieving full dorsiflexion comparable to the contralateral side.  She has tenderness over the anterior medial ankle joint line.  No tenderness over the lateral malleolus, fifth metatarsal base, Lisfranc complex, peroneal tendons, Achilles tendon.  No pain with stressing syndesmosis or any instability.  She does have some laxity of anterior drawer of the ankle compared to the contralateral side which has no laxity.  She has tenderness over the deltoid ligament anteriorly.  Specialty Comments:  FINDINGS: Segmentation:  Standard.   Alignment:  2 mm retrolisthesis of L5 on S1.   Vertebrae: No acute fracture, evidence of discitis, or aggressive bone lesion.   Conus medullaris and cauda equina: Conus extends to the L1 level. Conus and cauda equina appear normal.   Paraspinal and other soft tissues: No acute paraspinal abnormality.   Disc levels:   Disc spaces: Mild degenerative disease with disc height loss T12-L1 and L5-S1.   T12-L1: Small central disc protrusion. No foraminal or central canal stenosis.   L1-L2: Tiny central disc protrusion. No foraminal or central canal stenosis.   L2-L3: No significant disc bulge. No neural foraminal stenosis. No central canal stenosis.   L3-L4: No significant disc bulge. No neural foraminal stenosis. No central canal stenosis.   L4-L5: No significant disc bulge. No neural foraminal stenosis. No central canal stenosis. Mild bilateral facet arthropathy.   L5-S1: Small shallow central disc protrusion. No foraminal or central canal stenosis.   IMPRESSION: 1. At L5-S1 there is a small shallow central disc protrusion without nerve root impingement. 2. At T12-L1 and L1-2 there are small central disc protrusions without nerve root impingement. 3. No acute osseous injury of the lumbar spine.      Electronically Signed   By: Julaine Blanch M.D.   On: 12/28/2021 08:41  Imaging: No results found.   PMFS History: Patient Active Problem List   Diagnosis Date Noted   IUD (intrauterine device) in place 08/11/2023   Asthma 06/26/2023   S/P cesarean section 02/19/2023   H/O severe pre-eclampsia 02/19/2023   Protrusion of lumbar intervertebral disc 02/22/2022   Trochanteric bursitis, left hip 02/22/2022   AKI (acute kidney injury) 03/15/2018   Anxiety and depression 09/14/2015   Past Medical History:  Diagnosis Date   Anemia    Anxiety and depression 09/14/2015   Body aches    Dysuria    Family history of adverse reaction to anesthesia    mother-- hard to wake   Fibroadenoma of right breast    Frequency of urination    GERD (gastroesophageal reflux disease)    History of kidney stones    Hydronephrosis 03/24/2018   Bilateral, Right-mild to moderate, Left mild, noted on US  renal   IBS (irritable bowel syndrome)    Mild asthma  Nephrolithiasis    Urgency of urination    UTI (urinary tract infection)    Vaginal Pap smear, abnormal     Family History  Problem Relation Age of Onset   Hypertension Mother    Other Mother        mother had child hydrocephic-died   Hypertension Father    Heart disease Paternal Grandfather    Stroke Paternal Grandfather    Thyroid  disease Maternal Grandmother    Heart disease Maternal Grandmother    Cancer Paternal Grandmother        breast   Thyroid  disease Maternal Aunt    Cancer Paternal Aunt        breast    Past Surgical History:  Procedure Laterality Date   CESAREAN SECTION N/A 04/17/2018   Procedure: CESAREAN SECTION;  Surgeon: Corene Coy, MD;  Location: [facility];  Service: Obstetrics;  Laterality: N/A;   CESAREAN SECTION N/A 08/09/2023   Procedure: CESAREAN SECTION;  Surgeon: Erik Kieth BROCKS, MD;  Location: [facility];  Service: Obstetrics;  Laterality: N/A;   CYSTOSCOPY W/ URETERAL STENT PLACEMENT  Bilateral 03/16/2018   Procedure: CYSTOSCOPY WITH RETROGRADE PYELOGRAM/URETERAL STENT PLACEMENT;  Surgeon: Nieves Cough, MD;  Location: [facility];  Service: Urology;  Laterality: Bilateral;   CYSTOSCOPY/URETEROSCOPY/HOLMIUM LASER/STENT PLACEMENT Bilateral 05/06/2018   Procedure: CYSTOSCOPY/URETEROSCOPY/HOLMIUM LASER/STENT EXCHANGE STONE BASKET RETRIVAL;  Surgeon: Nieves Cough, MD;  Location: [facility];  Service: Urology;  Laterality: Bilateral;   EXTRACORPOREAL SHOCK WAVE LITHOTRIPSY Right 12/06/2023   Procedure: LITHOTRIPSY, ESWL;  Surgeon: Nieves Cough, MD;  Location: [facility];  Service: Urology;  Laterality: Right;   INTRAUTERINE DEVICE (IUD) INSERTION N/A 08/09/2023   Procedure: INTRAUTERINE DEVICE (IUD) INSERTION;  Surgeon: Erik Kieth BROCKS, MD;  Location: [facility];  Service: Obstetrics;  Laterality: N/A;   KNEE SURGERY Left age 37   TONSILLECTOMY     WISDOM TOOTH EXTRACTION     Social History   Occupational History   Not on file  Tobacco Use   Smoking status: Former    Current packs/day: 0.00    Types: Cigarettes    Start date: 05/02/2012    Quit date: 05/02/2014    Years since quitting: 10.0   Smokeless tobacco: Never  Vaping Use   Vaping status: Never Used  Substance and Sexual Activity   Alcohol use: Not Currently   Drug use: Never   Sexual activity: Not Currently    Birth control/protection: I.U.D.

## 2024-05-25 ENCOUNTER — Encounter: Payer: Self-pay | Admitting: Radiology

## 2024-05-28 ENCOUNTER — Ambulatory Visit (HOSPITAL_COMMUNITY)
Admission: RE | Admit: 2024-05-28 | Discharge: 2024-05-28 | Disposition: A | Discharge status: Home / Self Care | Source: Ambulatory Visit | Attending: Surgical | Admitting: Surgical

## 2024-05-28 DIAGNOSIS — M25571 Pain in right ankle and joints of right foot: Secondary | ICD-10-CM | POA: Diagnosis present

## 2024-06-03 ENCOUNTER — Ambulatory Visit: Payer: Self-pay | Admitting: Surgical

## 2024-06-03 ENCOUNTER — Other Ambulatory Visit: Payer: Self-pay | Admitting: Radiology

## 2024-06-03 DIAGNOSIS — M25571 Pain in right ankle and joints of right foot: Secondary | ICD-10-CM

## 2024-06-03 NOTE — Progress Notes (Signed)
 Referral entered

## 2024-06-03 NOTE — Progress Notes (Signed)
 Walterine Mullet, I called Tijana and discussed MRI results with her.  Can we send her to see Dr[facility] Kit at [facility] for surgical consultation?

## 2024-07-01 ENCOUNTER — Other Ambulatory Visit: Payer: Self-pay | Admitting: Urology

## 2024-07-02 ENCOUNTER — Encounter (HOSPITAL_COMMUNITY): Payer: Self-pay | Admitting: Urology

## 2024-07-02 NOTE — Progress Notes (Signed)
 LITHO PREOP PHONE CALL   ALLERGIES REVIEWED: YES  MEDICATION REVIEW DONE: YES MEDICATIONS THAT PT SHOULD HOLD (LIST): Tordol 24hr hold, Nsaids 48 hr hold  CAN PT WALK UP STAIRS WITHOUT SHORTNESS OF BREATH: YES HOME O2: NO CPAP: NO  IF YES, INFORMED PT TO BRING CPAP WITH TUBING AND MASK:YES/NO   INFORMED DRIVER NEEDED FOR PROCEDURE: YES   PT WAS GIVEN BLUE FOLDER AT UROLOGY APPT: YES PT INFORMED TO BRING BLUE FOLDER WITH ALL CONTENTS: YES  REVIEWED ARRIVAL TIME AND LOCATION: YES  OTHER PERTINENT INFORMATION:

## 2024-07-03 ENCOUNTER — Encounter (HOSPITAL_COMMUNITY): Payer: Self-pay | Admitting: Urology

## 2024-07-03 ENCOUNTER — Encounter (HOSPITAL_COMMUNITY)
Admission: RE | Disposition: A | Discharge status: Home / Self Care | Payer: Self-pay | Source: Ambulatory Visit | Attending: Urology

## 2024-07-03 ENCOUNTER — Ambulatory Visit (HOSPITAL_COMMUNITY)

## 2024-07-03 ENCOUNTER — Ambulatory Visit (HOSPITAL_COMMUNITY)
Admission: RE | Admit: 2024-07-03 | Discharge: 2024-07-03 | Disposition: A | Discharge status: Home / Self Care | Source: Ambulatory Visit | Attending: Urology | Admitting: Urology

## 2024-07-03 ENCOUNTER — Other Ambulatory Visit: Payer: Self-pay

## 2024-07-03 DIAGNOSIS — Z6841 Body Mass Index (BMI) 40.0 and over, adult: Secondary | ICD-10-CM | POA: Diagnosis not present

## 2024-07-03 DIAGNOSIS — E669 Obesity, unspecified: Secondary | ICD-10-CM | POA: Insufficient documentation

## 2024-07-03 DIAGNOSIS — N200 Calculus of kidney: Secondary | ICD-10-CM | POA: Insufficient documentation

## 2024-07-03 DIAGNOSIS — J45909 Unspecified asthma, uncomplicated: Secondary | ICD-10-CM | POA: Insufficient documentation

## 2024-07-03 LAB — POCT PREGNANCY, URINE: Preg Test, Ur: NEGATIVE

## 2024-07-03 SURGERY — LITHOTRIPSY, ESWL
Anesthesia: LOCAL | Laterality: Right

## 2024-07-03 MED ORDER — ONDANSETRON 8 MG PO TBDP: 8.0000 mg | ORAL_TABLET | Freq: Three times a day (TID) | ORAL | 1 refills | Status: AC | PRN

## 2024-07-03 MED ORDER — SODIUM CHLORIDE 0.9 % IV SOLN: INTRAVENOUS | Status: DC

## 2024-07-03 MED ORDER — DIPHENHYDRAMINE HCL 25 MG PO CAPS
25.0000 mg | ORAL_CAPSULE | ORAL | Status: AC
  Administered 2024-07-03: 25 mg via ORAL
  Filled 2024-07-03: qty 1

## 2024-07-03 MED ORDER — DIAZEPAM 5 MG PO TABS
10.0000 mg | ORAL_TABLET | ORAL | Status: AC
  Administered 2024-07-03: 10 mg via ORAL
  Filled 2024-07-03: qty 2

## 2024-07-03 MED ORDER — SENNOSIDES-DOCUSATE SODIUM 8.6-50 MG PO TABS: 1.0000 | ORAL_TABLET | Freq: Two times a day (BID) | ORAL | 0 refills | Status: AC

## 2024-07-03 MED ORDER — CIPROFLOXACIN HCL 500 MG PO TABS
500.0000 mg | ORAL_TABLET | ORAL | Status: AC
  Administered 2024-07-03: 500 mg via ORAL
  Filled 2024-07-03: qty 1

## 2024-07-03 MED ORDER — OXYCODONE-ACETAMINOPHEN 5-325 MG PO TABS: 1.0000 | ORAL_TABLET | Freq: Four times a day (QID) | ORAL | 0 refills | Status: AC | PRN

## 2024-07-03 NOTE — Discharge Instructions (Signed)
 1 - You may have urinary urgency (bladder spasms), pass small stone fragments and bloody urine on / off for up to 2 weeks. This is normal.  2 - Call MD or go to ER for fever >102, severe pain / nausea / vomiting not relieved by medications, or acute change in medical status

## 2024-07-03 NOTE — H&P (Signed)
 Melody Stewart is an 38 y.o. female.    Chief Complaint: Pre-OP RIGHT Shockwave Lithotripsy  HPI:   1-  RIGHT UPJ Stone - 10mm known Rt UPJ stone with likely ball-valve intermittent obstruction on CT and serial KUB late 2025. Stone is solitary.    Past Medical History:  Diagnosis Date   Anemia    Anxiety and depression 09/14/2015   Body aches    Dysuria    Family history of adverse reaction to anesthesia    mother-- hard to wake   Fibroadenoma of right breast    Frequency of urination    GERD (gastroesophageal reflux disease)    History of kidney stones    Hydronephrosis 03/24/2018   Bilateral, Right-mild to moderate, Left mild, noted on US  renal   IBS (irritable bowel syndrome)    Mild asthma    Nephrolithiasis    Urgency of urination    UTI (urinary tract infection)    Vaginal Pap smear, abnormal     Past Surgical History:  Procedure Laterality Date   CESAREAN SECTION N/A 04/17/2018   Procedure: CESAREAN SECTION;  Surgeon: Corene Coy, MD;  Location: [facility];  Service: Obstetrics;  Laterality: N/A;   CESAREAN SECTION N/A 08/09/2023   Procedure: CESAREAN SECTION;  Surgeon: Erik Kieth BROCKS, MD;  Location: [facility];  Service: Obstetrics;  Laterality: N/A;   CYSTOSCOPY W/ URETERAL STENT PLACEMENT Bilateral 03/16/2018   Procedure: CYSTOSCOPY WITH RETROGRADE PYELOGRAM/URETERAL STENT PLACEMENT;  Surgeon: Nieves Cough, MD;  Location: [facility];  Service: Urology;  Laterality: Bilateral;   CYSTOSCOPY/URETEROSCOPY/HOLMIUM LASER/STENT PLACEMENT Bilateral 05/06/2018   Procedure: CYSTOSCOPY/URETEROSCOPY/HOLMIUM LASER/STENT EXCHANGE STONE BASKET RETRIVAL;  Surgeon: Nieves Cough, MD;  Location: [facility];  Service: Urology;  Laterality: Bilateral;   EXTRACORPOREAL SHOCK WAVE LITHOTRIPSY Right 12/06/2023   Procedure: LITHOTRIPSY, ESWL;  Surgeon: Nieves Cough, MD;  Location: [facility];  Service: Urology;  Laterality: Right;    INTRAUTERINE DEVICE (IUD) INSERTION N/A 08/09/2023   Procedure: INTRAUTERINE DEVICE (IUD) INSERTION;  Surgeon: Erik Kieth BROCKS, MD;  Location: [facility];  Service: Obstetrics;  Laterality: N/A;   KNEE SURGERY Left age 89   TONSILLECTOMY     WISDOM TOOTH EXTRACTION      Family History  Problem Relation Age of Onset   Hypertension Mother    Other Mother        mother had child hydrocephic-died   Hypertension Father    Heart disease Paternal Grandfather    Stroke Paternal Grandfather    Thyroid  disease Maternal Grandmother    Heart disease Maternal Grandmother    Cancer Paternal Grandmother        breast   Thyroid  disease Maternal Aunt    Cancer Paternal Aunt        breast   Social History:  reports that she quit smoking about 10 years ago. Her smoking use included cigarettes. She started smoking about 12 years ago. She has never used smokeless tobacco. She reports that she does not currently use alcohol. She reports that she does not use drugs.  Allergies: Allergies[1]  Medications Prior to Admission  Medication Sig Dispense Refill   acetaminophen  (TYLENOL ) 500 MG tablet Take 2 tablets (1,000 mg total) by mouth every 6 (six) hours. 30 tablet 0   cetirizine (ZYRTEC) 10 MG tablet Take 10 mg by mouth daily.     diazepam  (VALIUM ) 5 MG tablet Take 5 mg by mouth 2 (two) times daily as needed (vertigo).     DULoxetine (  CYMBALTA) 30 MG capsule Take 30 mg by mouth daily.     ibuprofen  (ADVIL ) 400 MG tablet Take 400 mg by mouth every 6 (six) hours as needed for mild pain (pain score 1-3).     ibuprofen  (ADVIL ) 600 MG tablet Take 1 tablet (600 mg total) by mouth every 6 (six) hours. (Patient not taking: Reported on 09/16/2023) 30 tablet 0   ketorolac  (TORADOL ) 10 MG tablet Take 10 mg by mouth every 8 (eight) hours as needed for moderate pain (pain score 4-6).     meclizine (ANTIVERT) 25 MG tablet Take 25 mg by mouth 3 (three) times daily as needed (vertigo).     Omeprazole  20 MG TBEC Take 1  tablet (20 mg total) by mouth daily. 30 tablet 3   ondansetron  (ZOFRAN -ODT) 8 MG disintegrating tablet Take 8 mg by mouth every 8 (eight) hours as needed.     oxyCODONE  (OXY IR/ROXICODONE ) 5 MG immediate release tablet Take 1-2 tablets (5-10 mg total) by mouth every 4 (four) hours as needed for moderate pain (pain score 4-6). 20 tablet 0    No results found for this or any previous visit (from the past 48 hours). No results found.  Review of Systems  Constitutional:  Negative for chills and fever.  Genitourinary:  Positive for flank pain.  All other systems reviewed and are negative.   Height 5' 6 (1.676 m), weight 113 kg, currently breastfeeding. Physical Exam Vitals reviewed.  HENT:     Head: Normocephalic.  Eyes:     Pupils: Pupils are equal, round, and reactive to light.  Cardiovascular:     Rate and Rhythm: Normal rate.  Pulmonary:     Effort: Pulmonary effort is normal.  Abdominal:     Comments: Stable moderate truncal obesity  Genitourinary:    Comments: Mild Rt CVAT at present Musculoskeletal:        General: Normal range of motion.     Cervical back: Normal range of motion.  Skin:    General: Skin is warm.  Neurological:     General: No focal deficit present.     Mental Status: She is alert.      Assessment/Plan  Proceed as planned with RIGHT shockwave lithotripsy. Risks, benefits, alternatives, expected peri-treatment course discussed.  Ricardo KATHEE Alvaro Mickey., MD 07/03/2024, 11:18 AM       [1]  Allergies Allergen Reactions   Hydrocodone Other (See Comments)    Make her sick and constipated.

## 2024-07-03 NOTE — Brief Op Note (Signed)
 07/03/2024  1:05 PM  PATIENT:  Melody Stewart  38 y.o. female  PRE-OPERATIVE DIAGNOSIS:  right kidney stone  POST-OPERATIVE DIAGNOSIS:  * No post-op diagnosis entered *  PROCEDURE:  Procedures: LITHOTRIPSY, ESWL (Right)  SURGEON:  Surgeons and Role:    * Manny, Ricardo KATHEE Raddle., MD - Primary  PHYSICIAN ASSISTANT:   ASSISTANTS: none   ANESTHESIA:   MAC  EBL:  minimal   BLOOD ADMINISTERED:none  DRAINS: none   LOCAL MEDICATIONS USED:  NONE  SPECIMEN:  No Specimen  DISPOSITION OF SPECIMEN:  N/A  COUNTS:  YES  TOURNIQUET:  * No tourniquets in log *  DICTATION: .Note written in paper chart  PLAN OF CARE: Discharge to home after PACU  PATIENT DISPOSITION:  Short Stay   Delay start of Pharmacological VTE agent (>24hrs) due to surgical blood loss or risk of bleeding: not applicable

## 2024-07-06 ENCOUNTER — Encounter (HOSPITAL_COMMUNITY): Payer: Self-pay | Admitting: Urology

## 2024-08-21 ENCOUNTER — Other Ambulatory Visit: Payer: Self-pay | Admitting: *Deleted

## 2024-08-21 DIAGNOSIS — K219 Gastro-esophageal reflux disease without esophagitis: Secondary | ICD-10-CM

## 2024-08-21 MED ORDER — OMEPRAZOLE 20 MG PO TBEC: 1.0000 | DELAYED_RELEASE_TABLET | Freq: Every day | ORAL | 3 refills | Status: AC

## 2024-09-09 ENCOUNTER — Ambulatory Visit (HOSPITAL_BASED_OUTPATIENT_CLINIC_OR_DEPARTMENT_OTHER): Admission: RE | Admit: 2024-09-09 | Admitting: Orthopaedic Surgery

## 2024-09-09 ENCOUNTER — Encounter (HOSPITAL_BASED_OUTPATIENT_CLINIC_OR_DEPARTMENT_OTHER): Admission: RE | Payer: Self-pay
